# Patient Record
Sex: Male | Born: 1935 | Race: White | Hispanic: No | Marital: Married | Smoking: Former smoker
Health system: Southern US, Community
[De-identification: ages and names within clinical notes are randomized; demographics above are authoritative.]

## PROBLEM LIST (undated history)

## (undated) DIAGNOSIS — I10 Essential (primary) hypertension: Secondary | ICD-10-CM

## (undated) DIAGNOSIS — I639 Cerebral infarction, unspecified: Secondary | ICD-10-CM

## (undated) DIAGNOSIS — Z9889 Other specified postprocedural states: Secondary | ICD-10-CM

## (undated) DIAGNOSIS — M1990 Unspecified osteoarthritis, unspecified site: Secondary | ICD-10-CM

## (undated) DIAGNOSIS — K529 Noninfective gastroenteritis and colitis, unspecified: Secondary | ICD-10-CM

## (undated) DIAGNOSIS — R042 Hemoptysis: Secondary | ICD-10-CM

## (undated) DIAGNOSIS — I6529 Occlusion and stenosis of unspecified carotid artery: Secondary | ICD-10-CM

## (undated) DIAGNOSIS — E785 Hyperlipidemia, unspecified: Secondary | ICD-10-CM

## (undated) DIAGNOSIS — IMO0002 Reserved for concepts with insufficient information to code with codable children: Secondary | ICD-10-CM

## (undated) DIAGNOSIS — W1800XA Striking against unspecified object with subsequent fall, initial encounter: Secondary | ICD-10-CM

## (undated) HISTORY — DX: Cerebral infarction, unspecified: I63.9

## (undated) HISTORY — DX: Noninfective gastroenteritis and colitis, unspecified: K52.9

## (undated) HISTORY — DX: Hyperlipidemia, unspecified: E78.5

## (undated) HISTORY — DX: Occlusion and stenosis of unspecified carotid artery: I65.29

## (undated) HISTORY — DX: Striking against unspecified object with subsequent fall, initial encounter: W18.00XA

## (undated) HISTORY — DX: Essential (primary) hypertension: I10

## (undated) HISTORY — DX: Other specified postprocedural states: Z98.890

## (undated) HISTORY — DX: Unspecified osteoarthritis, unspecified site: M19.90

## (undated) HISTORY — DX: Reserved for concepts with insufficient information to code with codable children: IMO0002

## (undated) HISTORY — PX: OTHER SURGICAL HISTORY: SHX169

## (undated) HISTORY — PX: FRACTURE SURGERY: SHX138

---

## 1997-12-31 ENCOUNTER — Inpatient Hospital Stay (HOSPITAL_COMMUNITY): Admission: EM | Admit: 1997-12-31 | Discharge: 1998-01-02 | Payer: Self-pay | Admitting: Emergency Medicine

## 2000-03-01 ENCOUNTER — Encounter: Payer: Self-pay | Admitting: Vascular Surgery

## 2000-03-02 ENCOUNTER — Inpatient Hospital Stay: Admission: RE | Admit: 2000-03-02 | Discharge: 2000-03-03 | Payer: Self-pay | Admitting: Vascular Surgery

## 2000-03-02 ENCOUNTER — Encounter (INDEPENDENT_AMBULATORY_CARE_PROVIDER_SITE_OTHER): Payer: Self-pay | Admitting: Specialist

## 2000-03-02 HISTORY — PX: CAROTID ENDARTERECTOMY: SUR193

## 2000-07-10 ENCOUNTER — Encounter: Payer: Self-pay | Admitting: *Deleted

## 2000-07-11 ENCOUNTER — Inpatient Hospital Stay (HOSPITAL_COMMUNITY): Admission: EM | Admit: 2000-07-11 | Discharge: 2000-07-18 | Payer: Self-pay | Admitting: *Deleted

## 2000-07-11 ENCOUNTER — Encounter: Payer: Self-pay | Admitting: Orthopaedic Surgery

## 2000-07-12 ENCOUNTER — Encounter: Payer: Self-pay | Admitting: Orthopaedic Surgery

## 2000-07-14 ENCOUNTER — Encounter: Payer: Self-pay | Admitting: Orthopaedic Surgery

## 2000-07-20 ENCOUNTER — Encounter: Admission: RE | Admit: 2000-07-20 | Discharge: 2000-07-20 | Payer: Self-pay | Admitting: Family Medicine

## 2007-01-30 ENCOUNTER — Ambulatory Visit: Payer: Self-pay | Admitting: Vascular Surgery

## 2008-02-02 ENCOUNTER — Ambulatory Visit: Payer: Self-pay | Admitting: Vascular Surgery

## 2009-01-31 ENCOUNTER — Ambulatory Visit: Payer: Self-pay | Admitting: Vascular Surgery

## 2010-01-06 ENCOUNTER — Emergency Department (HOSPITAL_COMMUNITY): Admission: EM | Admit: 2010-01-06 | Discharge: 2010-01-06 | Payer: Self-pay | Admitting: Family Medicine

## 2010-01-06 ENCOUNTER — Emergency Department (HOSPITAL_COMMUNITY): Admission: EM | Admit: 2010-01-06 | Discharge: 2010-01-07 | Payer: Self-pay | Admitting: Emergency Medicine

## 2010-01-10 ENCOUNTER — Emergency Department (HOSPITAL_COMMUNITY): Admission: EM | Admit: 2010-01-10 | Discharge: 2010-01-10 | Payer: Self-pay | Admitting: Emergency Medicine

## 2010-01-30 ENCOUNTER — Ambulatory Visit: Payer: Self-pay | Admitting: Vascular Surgery

## 2010-04-23 ENCOUNTER — Encounter: Admission: RE | Admit: 2010-04-23 | Discharge: 2010-04-23 | Payer: Self-pay | Admitting: Family Medicine

## 2010-10-11 LAB — POCT I-STAT, CHEM 8
BUN: 29 mg/dL — ABNORMAL HIGH (ref 6–23)
Calcium, Ion: 1.15 mmol/L (ref 1.12–1.32)
Creatinine, Ser: 1 mg/dL (ref 0.4–1.5)
Glucose, Bld: 108 mg/dL — ABNORMAL HIGH (ref 70–99)
HCT: 35 % — ABNORMAL LOW (ref 39.0–52.0)
Hemoglobin: 11.9 g/dL — ABNORMAL LOW (ref 13.0–17.0)
Potassium: 4.4 mEq/L (ref 3.5–5.1)
Sodium: 134 mEq/L — ABNORMAL LOW (ref 135–145)

## 2010-10-11 LAB — CBC
HCT: 32.2 % — ABNORMAL LOW (ref 39.0–52.0)
Hemoglobin: 11.1 g/dL — ABNORMAL LOW (ref 13.0–17.0)
MCHC: 34.5 g/dL (ref 30.0–36.0)
MCV: 98.4 fL (ref 78.0–100.0)
Platelets: 189 10*3/uL (ref 150–400)
RBC: 3.27 MIL/uL — ABNORMAL LOW (ref 4.22–5.81)
WBC: 7.4 10*3/uL (ref 4.0–10.5)

## 2010-10-12 LAB — BASIC METABOLIC PANEL
GFR calc Af Amer: >60 mL/min (ref >60)
Glucose, Bld: 187 mg/dL — ABNORMAL HIGH (ref 70–99)
Sodium: 141 mEq/L (ref 135–145)

## 2010-10-12 LAB — DIFFERENTIAL
Basophils Absolute: 0 10*3/uL (ref 0.0–0.1)
Eosinophils Absolute: 0 10*3/uL (ref 0.0–0.7)
Eosinophils Relative: 0 % (ref 0–5)
Lymphs Abs: 0.5 10*3/uL — ABNORMAL LOW (ref 0.7–4.0)
Monocytes Relative: 9 % (ref 3–12)
Neutrophils Relative %: 86 % — ABNORMAL HIGH (ref 43–77)

## 2010-10-12 LAB — CBC
Hemoglobin: 15.4 g/dL (ref 13.0–17.0)
WBC: 9.7 10*3/uL (ref 4.0–10.5)

## 2010-12-08 NOTE — Assessment & Plan Note (Signed)
OFFICE VISIT   MONTRAE, BRAITHWAITE  DOB:  [birth year]                                       01/31/2009  CHART#:[id number]   Charles Bridges presents today for follow up of Charles extreme cerebrovascular  occlusive disease and also concern regarding left ankle cellulitis  concern regarding arterial flow.  He is a very active, pleasant 73-year  gentleman who is now 9 years status post right carotid endarterectomy by  myself.  We follow him in noninvasive followup for this, and has had no  evidence of recurrence.  He seems to have no history of amaurosis fugax,  transient ischemic attack or stroke.  He does have an episode of  erythema and irritation in Charles left medial ankle with some bilateral  swelling.  He reports this has spontaneously resolved.  He was concerned  since Charles mother had lower extremity amputation prior to her death  related to arterial insufficiency.  He is quite active.  He hunts  regularly and does not have any new medical difficulties.   PHYSICAL EXAMINATION:  Well-nourished, well-developed white male  appearing stated age of 70.  Charles blood pressure is 167/70, pulse 64,  respirations 18.  He does have a well-healed right carotid incision.  He  has no bruits bilaterally.  He is grossly intact neurologically.  He has  2+ radial and 2+ posterior tibial pulses.   He underwent noninvasive vascular laboratory studies today and this  reveals no change.  Charles right endarterectomy site is widely patent with  no evidence of restenosis.  Charles left carotid is stable at 40-59%  asymptomatic narrowing.  I discussed this with Charles Bridges and Charles Bridges  present.  We will see him again on a yearly basis for carotid duplex to  rule out any progression of carotid disease.  I explained that he has  normal lower extremity flow and should be at no increased risk for  amputation currently.   Larina Earthly, M.D.  Electronically Signed   TFE/MEDQ  D:  01/31/2009  T:   02/03/2009  Job:  [id number]

## 2010-12-08 NOTE — Procedures (Signed)
CAROTID DUPLEX EXAM   INDICATION:  Followup evaluation of known carotid artery disease.   HISTORY:  Diabetes:  Yes.  Cardiac:  No.  Hypertension:  Yes.  Smoking:  Quit in 2002.  Previous Surgery:  Right carotid endarterectomy on 03/02/2000 by Dr.  Arbie Cookey.  CV History:  Stroke in 1998 and 1999.  Amaurosis Fugax No, Paresthesias Yes, Hemiparesis No                                       RIGHT             LEFT  Brachial systolic pressure:         146               154  Brachial Doppler waveforms:         WNL               WNL  Vertebral direction of flow:        Antegrade         Antegrade  DUPLEX VELOCITIES (cm/sec)  CCA peak systolic                   89                194  ECA peak systolic                   168               204  ICA peak systolic                   79                134  ICA end diastolic                   26                36  PLAQUE MORPHOLOGY:                  None              Calcified  PLAQUE AMOUNT:                      None              Moderate  PLAQUE LOCATION:                    CCA               ICA/ECA   IMPRESSION:  1. Patent right internal carotid artery with no evidence of      restenosis.  2. 40-59% left internal carotid artery stenosis.        ___________________________________________  Larina Earthly, M.D.   AC/MEDQ  D:  01/31/2009  T:  01/31/2009  Job:  [id number]

## 2010-12-08 NOTE — Procedures (Signed)
CAROTID DUPLEX EXAM   INDICATION:  Followup known carotid artery disease.   HISTORY:  Diabetes:  No  Cardiac:  No  Hypertension:  Yes  Smoking:  Quit in 2002  Previous Surgery:  Right carotid endarterectomy.  Please see note from  below  CV History:  Amaurosis Fugax Yes No, Paresthesias Yes No, Hemiparesis Yes No                                       RIGHT             LEFT  Brachial systolic pressure:         120               120  Brachial Doppler waveforms:         Biphasic          Biphasic  Vertebral direction of flow:        Antegrade         Antegrade  DUPLEX VELOCITIES (cm/sec)  CCA peak systolic                   71                97  ECA peak systolic                   159               163  ICA peak systolic                   77                112  ICA end diastolic                   17                15  PLAQUE MORPHOLOGY:                  None              Calcified  PLAQUE AMOUNT:                      None              Mild  PLAQUE LOCATION:                    None              ICA and ECA.   IMPRESSION:  A 20-39% stenosis noted in the left internal carotid  artery.  Normal carotid duplex with minimal intimal changes noted in the  right internal carotid artery.  Status post right carotid endarterectomy  and right bilateral vertebral arteries.   ___________________________________________  Larina Earthly, M.D.   MG/MEDQ  D:  01/30/2007  T:  01/30/2007  Job:  [id number]

## 2010-12-08 NOTE — Assessment & Plan Note (Signed)
OFFICE VISIT   SHANDY, VI  DOB:  1936/01/22                                       01/30/2007  CHART#:[id number]   The patient presents today for continued followup of his carotid  disease. He is in his usual good health with no new major medical  difficulties. He denies any new cardiac difficulties. He is  hypertensive. He does not smoking having quit several years ago. He is  status post right carotid endarterectomy. He has questions regarding the  Dupuytren's contraction in both hands and this is not currently limiting  to him and I have recommended that he continue observation unless this  is limiting. He does have gout in his wrists as well and this is  limiting him as much as the contractures.   Does have 2+ radial pulses bilaterally. His right carotid incision is  well-healed with no bruits bilaterally. He is grossly intact  neurologically. He underwent repeat carotid duplex in our offices and  reveals no significant change. He does have minimal plaque in the right  carotid system with no stenosis. He does have a 20-39% stenosis in the  left carotid.   I have recommended that we continue to see him on a yearly basis. He  will notify us should he develop any difficulty and otherwise we will  repeat his duplex in one year.   Larina Earthly, M.D.  Electronically Signed   TFE/MEDQ  D:  01/30/2007  T:  01/30/2007  Job:  [id number]   cc:   Lianne Bushy, M.D.

## 2010-12-08 NOTE — Procedures (Signed)
CAROTID DUPLEX EXAM   INDICATION:  Follow up known carotid disease.   HISTORY:  Diabetes:  Yes.  Cardiac:  Some tachycardia lately.  Hypertension:  Yes.  Smoking:  Quit in 2002.  Previous Surgery:  Right carotid endarterectomy on 03/02/2000 by Dr.  Arbie Cookey.  CV History:  Stroke in 1998 and 1999.  The patient blacked out 3 weeks  ago.  Amaurosis Fugax No, Paresthesias No, Hemiparesis No                                       RIGHT             LEFT  Brachial systolic pressure:         140               140  Brachial Doppler waveforms:         normal            normal  Vertebral direction of flow:        antegrade         antegrade  DUPLEX VELOCITIES (cm/sec)  CCA peak systolic                   93                154  ECA peak systolic                   143               287  ICA peak systolic                   102               186  ICA end diastolic                   16                32  PLAQUE MORPHOLOGY:                  mixed             calcific  PLAQUE AMOUNT:                      mild              moderate  PLAQUE LOCATION:                    CCA               ICA, ECA and CCA   IMPRESSION:  1. Patent right carotid endarterectomy with no evidence of restenosis      or narrowing.  2. Doppler velocities suggest 40%-59% stenosis in the left internal      carotid artery.  Left external carotid artery stenosis.  3. Antegrade flow in bilateral vertebral arteries.   ___________________________________________  Larina Earthly, M.D.   NT/MEDQ  D:  01/30/2010  T:  01/30/2010  Job:  [id number]

## 2010-12-08 NOTE — Procedures (Signed)
CAROTID DUPLEX EXAM   INDICATION:  Follow-up evaluation of known carotid artery disease.   HISTORY:  Diabetes:  No.  Cardiac:  No.  Hypertension:  Yes.  Smoking:  Quit in 2002.  Previous Surgery:  Right carotid endarterectomy with on 03/02/00 by Dr.  Arbie Cookey.  CV History:  Previous duplex on 01/30/07 revealed a 20-39% left internal  carotid artery stenosis and no right internal carotid artery stenosis,  status post endarterectomy.  Patient complains of residual left arm  numbness since his stroke.  Amaurosis Fugax No, Paresthesias Yes, Hemiparesis No.                                       RIGHT             LEFT  Brachial systolic pressure:         164               168  Brachial Doppler waveforms:         Triphasic         Triphasic  Vertebral direction of flow:        Antegrade         Antegrade  DUPLEX VELOCITIES (cm/sec)  CCA peak systolic                   83                103  ECA peak systolic                   162               181  ICA peak systolic                   112               121  ICA end diastolic                   20                24  PLAQUE MORPHOLOGY:                  Soft              Calcified  PLAQUE AMOUNT:                      Minimal           Mild-to-moderate  PLAQUE LOCATION:                    Mid ICA           Proximal ICA   IMPRESSION:  1. 20-39% right internal carotid artery stenosis, status post      endarterectomy.  2. 40-59% left internal carotid artery stenosis.  3. No significant change from previous study performed on 01/30/07.   ___________________________________________  Larina Earthly, M.D.   MC/MEDQ  D:  02/02/2008  T:  02/02/2008  Job:  [id number]

## 2010-12-11 NOTE — H&P (Signed)
[facility]  Patient:    Charles Bridges, Charles Bridges                         MRN: [id number] Adm. Date:  03/02/00 Attending:  Larina Earthly, M.D. Dictator:   Maple Mirza, P.A.                         History and Physical  DATE OF BIRTH:  May 27, 1936  PRIMARY CARE PHYSICIAN:  Maricela Bo, M.D.  REFERRING NEUROLOGIST:  Marlan Palau, M.D.  PRESENTING CIRCUMSTANCE:  "I am here for surgery."  HISTORY OF PRESENT ILLNESS:  This is a 75 year old male with a history of two right hemispheric cerebrovascular accidents in 1998 and then in 1999.  Both affected his left side.  He has made good progression in rehabilitation such that his deficits are currently minor on the left.  He has been followed for extracranial cerebrovascular occlusive disease by Dr. Arbie Cookey, and presents at his latest examination with significant progress of his right internal carotid artery stenosis.  Carotid Doppler ultrasound studies done on February 25, 2000 demonstrate severe right internal carotid artery flow reduction and moderate left internal carotid artery flow reduction.  He is currently asymptomatic. He has no symptoms of amaurosis fugax, dizziness, no new left sided focal deficits.  Of note, however, he complains of a burning sensation which is intermittent and affects the vertex of the skull posteriorly on the left side. He says it also itches, as well, and he feels as if he is able to extract scabs from the scalp.  He says that his aunt had a history of shingles affecting her scalp.  On cursory examination today, I could find no evidence of vesicles or evidence of any other dermatologically affecting rash or lesion.  We will just have to follow this as he gets to [facility].  ALLERGIES:  No known drug allergies.  MEDICATIONS: 1. Plavix 75 mg q.d. 2. Hydrochlorothiazide 25 mg q.d. 3. Lipitor 10 mg q.d. 4. Sulindac 200 mg q.d. (which is an NSAID).  PAST  MEDICAL HISTORY:  1. Right cerebrovascular accident x 2 in 1998 and 1999 with minor left sided     sequelae.  2. Hypertension.  3. Hypercholesterolemia.  4. History of long term chronic tobacco habituation, smoking two packs per     day currently.  5. One case of beer imbibed each week.  6. Status post right shoulder fracture repair 40 years ago.  7. History of hiatal hernia/gastric ulcers.  8. Status post vagotomy 30 years ago.  9. Chronic diarrhea. 10. The patient denies any prior history of myocardial infarction, diabetes,     nephrolithiasis, cholecystitis, rheumatic fever or tuberculosis.  SOCIAL HISTORY:  He is married for the second time.  His last marriage lasting 27 years.  He has two children in good health.  He is retired currently.  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  He currently smokes two packs per day. He started when he was aged 28.  This is a 42 year, two pack per day history. He also partakes of alcoholic beverages, imbibing one case of beer per week plus home made wine as necessary.  FAMILY HISTORY:  His mother died at the age of 47.  She had circulation problems and had a lower extremity amputation, but she died of breast cancer. His father died at the age of 2  of a cerebrovascular accident while golfing. One brother died of a brain tumor.  His other siblings are in good health.  PERTINENT REVIEW OF SYSTEMS:  Once again, he complains of a burning sensation in the posterior left scalp without any evident lesions at the present time. He complains of chronic diarrhea.  At one time, he was on a medication which he was not aware of, but this has now been allowed to lapse.  He has left sided sequelae of his right cerebrovascular accident, for which he has compensated well.  It is still slightly weaker than the right and a little more clumsy than the right as far as his motor function is concerned on the left.  He is possibly a candidate for withdrawal symptoms,  since he imbibes one case of beer per week and imbibes home made wine as needed.  PHYSICAL EXAMINATION:  GENERAL:  Alert and oriented, quite vigorous appearing gentleman who leads an active life style in no acute distress.  VITAL SIGNS:  Temperature 97.9, pulse 80, respirations 18, blood pressure in the left upper extremity 150/60.  HEENT:  Pupils equal, round and reactive to light.  Extraocular movements intact.  Ears and hearing grossly normal.  Nares patent.  Sinuses clear. Ophthalmoscopic examination deferred.  Oropharynx - I did not question whether he wears dentures or not.  NECK:  Supple.  No jugular venous distention.  No thyromegaly.  There is very soft right carotid bruit auscultated.  A moderately soft left carotid bruit is auscultated.  Carotid up strokes are 2+ bilaterally.  CHEST:  Lungs clear to auscultation and percussion bilaterally.  HEART:  Regular rate and rhythm with occasional premature beats.  ABDOMEN:  Soft.  Bowel sounds are present.  Abdominal aorta is nonpulsatile. No abdominal bruits auscultated.  No hepatosplenomegaly.  EXTREMITIES:  No evidence of clubbing or cyanosis.  Radial pulses 4/4 bilaterally.  Femoral pulses 4/4 bilaterally.  He has palpable posterior tibial pulses bilaterally.  No ischemic changes to the lower extremities.  No varicosities.  NEUROMUSCULAR:  Grip is 4/5 on the left, 5/5 on the right.  His fine motor coordination is preserved both right and left.  His ambulation is steady. His gait is normal.  Deep tendon reflexes are 2+ bilaterally in the patellar region.  RECTAL:  Deferred.  IMPRESSION: 1. Severe right internal carotid artery stenosis. 2. History of right hemispheric cerebrovascular accident x 2.  PLAN:  Right carotid endarterectomy on August 8 by Dr. Gretta Began. DD:  03/01/00 TD:  03/01/00 Job: [id number]
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
## 2010-12-11 NOTE — H&P (Signed)
[place]. [facility]  Patient:    Charles Bridges, Charles Bridges                       MRN: [id number] Adm. Date:  [id number] Attending:  Randolm Idol                         History and Physical  SHE SAYS TO PLEASE DISREGARD THIS DICTATION. DD:  07/11/00 TD:  07/11/00 Job: [id number]

## 2010-12-11 NOTE — Op Note (Signed)
[place]. [facility]  Patient:    Charles Bridges, Charles Bridges                       MRN: [id number] Proc. Date: 07/12/00 Adm. Date:  [id number] Attending:  Randolm Idol                           Operative Report  PREOPERATIVE DIAGNOSIS:  Displaced left distal third tibia fracture.  POSTOPERATIVE DIAGNOSIS:  Displaced left distal third tibia fracture.  PROCEDURE:  Closed reduction with internal fixation with intramedullary nail.  SURGEON:  Claude Manges. Cleophas Dunker, M.D.  ASSISTANT:  Jamelle Rushing, P.A.  ANESTHESIA:  General orotracheal.  COMPLICATIONS:  None.  DESCRIPTION OF PROCEDURE:  With the patient comfortable on the trauma table and under general orotracheal anesthesia, the left lower extremity was placed in a thigh tourniquet.  The leg was then prepped with Duraprep from the tips of the toes to the tourniquet.  Sterile draping was performed.  With the extremity still elevated, it was Esmarch exsanguinated, the proximal tourniquet to 350 mmHg.  A longitudinal incision was made just medial to the midline between the joint line and the tibial tubercle.  By sharp dissection, the incision was carried down through subcutaneous tissue and fascia.  This was incised with a knife and then subperiosteally elevated.  The patellar tendon was retracted laterally at a point directly at the midline of the patellar tendon between the tibial tubercle and the joint line.  A small drill hole was made.  The awl was then introduced to obtain the starter hole.  I then inserted the bulb-tipped guide pin and was able to place it through the proximal tibial metaphysis through the shaft, and then under image intensification it was guided across the fracture line as Lyman Speller maintained anatomical reduction of the fracture.  We checked to be sure that the guide pin was to the center of the tibial canal in both the AP and lateral projections, and it appeared it was well within  the bone.  The bulb tip was impacted into the subchondral bone in the distal tibia.  Preoperatively I had measured about an 8 mm nail at the most narrow point of the isthmus.  Reaming was performed to 9 mm with a 1 mm over-ream to accept an 8 mm nail.  Reaming was performed with image intensification, revealing excellent position, the guide pin maintained its position.  The plastic exchange tube was then inserted over the ball-tip guide pin, and the guide tip pin was exchanged for a more narrow guide pin.  We then measured the length of the tibia at 33 mm with an 8 mm nail.   The Ace DePuy titanium nail was then impacted over the guide pin through the proximal tibia across the fracture site, maintaining manual reduction, into the distal tibial metaphysis.  We had an excellent position and perfect stability with medial-lateral motion.  There was some instability with rotation as expected.  The proximal guide had been applied to the proximal pin.  Two crossed screws were then inserted into the proximal tibia under image intensification with excellent purchase and good position of the pins in both AP and lateral projections.  The proximal guide was then removed from the field.  Two distal screws were then inserted to stabilize the fracture in rotation. Under direct image intensification in the lateral projection, small stab wounds were made.  The drill holes were then placed through the tibia medial to lateral, making sure that we were in the center of the nail.  We measured the depth of the screw and inserted self-tapping cortical screws.  We had excellent purchase with good position on the AP projection.  The fracture was fluoroscoped.  There was no malrotation, and we had excellent stability.  The wounds were then irrigated with antibiotic solution.  The subcutaneous tissue was closed with 2-0 Vicryl distally and the skin closed with skin clips proximally.  The deep fascia was closed  with a running 0 Vicryl, the proximal fascia with 2-0 Vicryl, skin was closed with skin clips. Marcaine 0.25% without epinephrine was injected into the wound edges.  The tourniquet was deflated with immediate capillary refill to the toes.  A sterile bulky dressing was applied from the ankle to the knee, followed by an Ace bandage.  The patient was then returned to the postanesthesia recovery room in satisfactory condition. DD:  07/12/00 TD:  07/13/00 Job: [id number]

## 2010-12-11 NOTE — Discharge Summary (Signed)
[facility]  Patient:    Charles Bridges, Charles Bridges                       MRN: [id number] Adm. Date:  [id number] Disc. Date: 03/03/00 Attending:  Alyson Locket Dictator:   Maple Mirza, P.A. CC:         Maricela Bo, M.D., [place], [place], M.D.   Discharge Summary  DATE OF BIRTH:  1936/02/22  FINAL DIAGNOSES: 1. 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 2. History of right hemispheric cerebrovascular accident x 2 with resolution    of sequelae.  SECONDARY DIAGNOSES: 1. History of right cerebrovascular accident x 2 in 1998 and 1999. 2. Hypertension. 3. Hypercholesterolemia. 4. History of long-term chronic tobacco habituation. 5. Significant ethanol use, one case of beer per week with wine    supplementation. 6. Status post right shoulder fracture repair 40 years ago. 7. History of hiatal hernia/gastric ulcers. 8. Status post vagotomy 30 years ago. 9. Chronic diarrhea.  PROCEDURE:  March 02, 2000, right carotid endarterectomy with Dacron angioplasty, intraoperative shunting, Dr. Tawanna Cooler Early.  The patient tolerated the procedure well and was transferred in stable and satisfactory condition to the recovery room.  He awoke there with full baseline neurologic status, no focal or regional neurologic deficits.  He was not confused.  He was quite calm, and he remains this way in the postoperative period.  DISCHARGE DISPOSITION:  Charles Bridges is judged a suitable candidate on March 03, 2000.  His incision is healing nicely.  There is no evidence of erythema, swelling, or drainage.  His mental status has been clear in the postoperative period.  He has had no neurologic deficit manifested.  He is taking all nourishment well, tolerating it.  He is ambulating without assistance.  His postoperative laboratories are all within normal limits.  His pain is well  controlled with oral analgesia.  DISCHARGE ACTIVITY:  Ambulate as tolerated.  He is asked not to drive for the next two weeks.  DISCHARGE DIET:  Low-sodium, low-cholesterol diet.  WOUND CARE:  He may shower beginning Saturday, August 11.  He is asked to sponge bathe until then and keep his incision clean and dry.  FOLLOWUP:  He has an office visit for followup with Dr. Gretta Began on Thursday, March 17, 2000, at 10:40 in the morning.  DISCHARGE MEDICATIONS: 1. Percocet 5/325 one to two tablets p.o. q.4-6h. p.r.n. pain. 2. Plavix 75 mg daily. 3. Hydrochlorothiazide 25 mg daily. 4. Lipitor 10 mg daily. 5. Sulindac 200 mg daily.  BRIEF HISTORY:  Charles Bridges is a 75 year old male with a history of two right hemispheric cerebrovascular accidents in 1998 and then in 1999, both affected his left side.  He has made good progression in rehabilitation such that his deficits are currently minor on the left.  He has been followed for his extracranial cerebrovascular occlusive disease by Dr. Arbie Cookey and presented at his latest examination with significant progress in a right internal artery stenosis.  Carotid duplex ultrasound was done August 2 that demonstrates severe right internal carotid artery flow reduction and moderate left internal carotid artery flow reduction.  He is current asymptomatic and has had no symptoms of amaurosis fugax, no dizziness, no left-sided focal deficits.  He does, however, complain of burning sensation which is intermittent and affects the vertex of the skull posteriorly on the left side.  He also says that this area itches as well, and he feels as if he is able to extract scabs from the scalp.  He said that his aunt had a history of shingles which affected her scalp.  On cursory examination prior to the surgery, there was no evidence of vesicles or other dermatologically affecting rash or lesion.  HOSPITAL COURSE:  As dictated in the Discharge Disposition.   Charles Bridges awoke in the recovery room with baseline neurologic status.  He has remained that way in the postoperative period.  He has not been confused postoperatively.  His postoperative labs are within normal limits.  His vital signs have been stable.  He is taking oral nourishment.  His incision is healing nicely.  His pain is controlled with oral analgesia.  He is ambulating.  He will be discharged August 9 with the medications above and followup with Dr. Arbie Cookey. DD:  03/02/00 TD:  03/03/00 Job: [id number]

## 2010-12-11 NOTE — Op Note (Signed)
[facility]  Patient:    Charles Bridges, Charles Bridges                       MRN: [id number] Proc. Date: 03/02/00 Adm. Date:  [id number] Attending:  Alyson Locket CC:         Maricela Bo, M.D., [address] [place], [place] [address]   Operative Report  PREOPERATIVE DIAGNOSIS:  Severe asymptomatic right internal carotid artery stenosis.  POSTOPERATIVE DIAGNOSIS:  Severe asymptomatic right internal carotid artery stenosis.  PROCEDURE:  Right carotid endarterectomy, Dacron patch angioplasty.  SURGEON:  Larina Earthly, M.D.  ASSISTANT:  Loura Pardon, P.A.  ANESTHESIA:  General endotracheal.  COMPLICATIONS:  None.  DISPOSITION:  To recovery room stable.  DESCRIPTION OF PROCEDURE:  The patient was taken to the operating room and placed in a supine position where the area of the right neck was prepped and draped in the usual sterile fashion.  Incision made anterior sternocleidomastoid and carried down through the platysma with electrocautery. The sternocleidomastoid reflected posteriorly, and the carotid sheath was opened.  The facial vein was ligated with 2-0 silk ties and divided. Hypoglossal and vagus nerves were identified and preserved.  The common carotid artery was encircled with an umbilical tape and Rumel tourniquet.  The external carotid was encircled with a blue Vessi-loop.  The internal carotid was encircled with an umbilical tape and Rumel tourniquet.  The superior thyroid artery was encircled with a 2-0 silk Potts tie.  The patient was given 7000 units of intravenous heparin.  After adequate circulation time, the internal and external common carotid arteries were occluded.  The common carotid arteries opened with an 11 blade and extended longitudinally with Potts scissors.  The 10 shunt was passed into the internal carotid, down the back ______ and down the common carotid where it was secured with Rumel tourniquet.  Endarterectomy was begun  on the common carotid artery. The plaque was divided proximally with Potts scissors.  The endarterectomy was continued on to the bifurcation.  The external carotid endarterectomy with eversion technique.  The internal carotid endarterectomy in an open fashion.  Remaining atheromatous debris was removed from the endarterectomy plane.  The Finesse Hemashield Dacron patch was brought onto the field and was sewn as a patch angioplasty with a running 6-0 Prolene suture.  Prior to completion of the anastomosis, the usual flushing maneuvers were undertaken.  The anastomosis was then completed in the external followed by the common followed by the internal carotid artery.  Occlusion clamp was removed.  Excellent flow characteristics were noted with hand-held Doppler in the internal and external carotid arteries.  The patient was given 50 mg of Protamine to reverse the heparin.  The wounds were irrigated with saline.  Hemostasis achieved with electrocautery.  The wounds were closed with 3-0 Vicryl in the subcutaneous and subcuticular tissue.  Benzoin and Steri-Strips were applied. DD:  03/02/00 TD:  03/02/00 Job: [id number]

## 2010-12-11 NOTE — Discharge Summary (Signed)
[place]. [facility]  Patient:    Charles Bridges, Charles Bridges                       MRN: [id number] Adm. Date:  [id number] Disc. Date: [id number] Attending:  Randolm Idol Dictator:   Jamelle Rushing, P.A.C.                           Discharge Summary  ADMISSION DIAGNOSES: 1. Left tibial fracture. 2. Hyponatremia. 3. Hypokalemia. 4. Hypertension. 5. History of cerebrovascular accident. 6. Hypercholesterolemia. 7. Alcohol use.  DISCHARGE DIAGNOSES: 1. Left IM nailing of tibial fracture. 2. Hyponatremia. 3. Hypokalemia. 4. Hypertension. 5. Alcohol use. 6. Hypercholesterolemia. 7. History of cerebrovascular accident.  HISTORY OF PRESENT ILLNESS:  The patient is a 75 year old white male who was walking in his driveway on a night previous to admission when he fell.  The patient heard a popping in his left leg and was unable to stand.  The patient stated he was drinking on the night of the injury.  ALLERGIES:  No known drug allergies.  CURRENT MEDICATIONS: 1. Plavix 75 mg p.o. q.d. 2. Lipitor 10 mg p.o. q.d. 3. Sulindac 200 mg p.o. q.d. 4. Hydrochlorothiazide 25 mg p.o. q.d. 5. Vitamin E 400 i.u. p.o. q.d.  SURGICAL PROCEDURE:  On July 12, 2000, the patient was taken to the OR by Claude Manges. Cleophas Dunker, M.D., assisted by Jamelle Rushing, P.A.C.  Under general anesthesia the patient had an open reduction and internal fixation with an intramedullary nail of his left tibial fracture.  The patient tolerated the procedure well.  No drains were left in place and the patient was transferred to the recovery room in good condition.  COMPLICATIONS:  There were no complications.  CONSULTS:  On admission, a family practice teaching service consult was requested for evaluation and management of the patients multiple medical problems.  On July 12, 2000, physical therapy consult was requested.  HOSPITAL COURSE:  On July 11, 2000, the patient was admitted  to [facility]. [facility] under the care of Dr. Norlene Campbell.  The patient was evaluated by family practice teaching service for evaluation of the patients multiple medical problems and management of these.  The patient was also asked to be cleared for his upcoming surgery. The patient was cleared so he was, in fact, taken to the OR on hospital day #2.  On July 12, 2000, the patient was taken to the OR where a closed reduction and internal nailing of the left tibial fracture was performed.  The patient tolerated the procedure well and was discharged to the recovery room in good condition.  The patient then had a six day postoperative course in which he did have some initial problems with increased swelling and pain in his left lower extremity.  Doppler evaluation of that was performed and was found to be negative for any DVT, superficial thrombus, or Bakers cyst.  The patient continued to progress nicely, have less and less pain on a day to day basis and no significant problems from his multiple medical problems were incurred during this hospitalization.  The patient was discharged to home on postoperative day #6, being afebrile, vital signs stable.  His left leg was moderately ecchymotic with skin blisters.  His leg was neuromotor vascularly intact.  LABS:  Venous Doppler on July 14, 2000, found no evidence of lower extremity DVT, superficial thrombus or Bakers cyst  bilaterally.  EKG on July 11, 2000, was normal sinus rhythm at 80 beats per minute.  Chest x-ray on July 14, 2000, showed mild right basilar atelectasis with interval worsening on the left.  A CBC on July 15, 2000, showed wbc 7.3, hemoglobin 10.9, hematocrit 30.6, platelets 247.  Routine chemistries on July 16, 2000, showed sodium of 135, potassium 4.4, glucose 93, BUN 7, creatinine 0.7.  Lipid studies on July 12, 2000, showed cholesterol 121, triglycerides 124, HDL 44, LDL  52, cholesterol to HDL ratio 2.8.  Alcohol level on admission was 176.  MEDICATIONS ON DISCHARGE: 1. Multivitamins one tablet p.o. q.d. 2. Thiamine 100 mg p.o. q.d. 3. Protonix 40 mg p.o. q.d. 4. Nicotine patch 21 mcg q.24h. 5. Hydrochlorothiazide 25 mg p.o. q.d. 6. Plavix 75 mg p.o. q.d. 7. Zocor 20 mg p.o. q.d. 8. Norvasc 5 mg p.o. q.d. 9. Darvocet one or two tablets every four to six hours p.r.n. pain.  DISCHARGE INSTRUCTIONS: 1. Medications:  Darvocet-N 100 one or two tablets every four to six    hours    for pain if needed. 2. Activity:  Touchdown weightbearing only on the left foot with the use    of a walker, elevate leg at all times when not walking, use wheelchair    whenever possible. 3. Wound care:  Keep wound clean and dry and change dressing daily.  Check for    infection, any increased redness, swelling, numbness or pain. 4. The patient is to have home health physical therapy through [facility].  FOLLOW-UP:  The patient is to call Maricela Bo, M.D.s, office for an appointment in one to two weeks. Call Dr. Serita Grit office at [phone] for a follow-up appointment in one week.  CONDITION ON DISCHARGE:  Listed as good. DD:  08/13/00 TD:  08/15/00 Job: [id number]

## 2011-01-26 ENCOUNTER — Other Ambulatory Visit: Payer: Self-pay

## 2011-02-18 ENCOUNTER — Other Ambulatory Visit (INDEPENDENT_AMBULATORY_CARE_PROVIDER_SITE_OTHER): Payer: Medicare (Managed Care)

## 2011-02-18 DIAGNOSIS — I6529 Occlusion and stenosis of unspecified carotid artery: Secondary | ICD-10-CM

## 2011-02-18 DIAGNOSIS — Z48812 Encounter for surgical aftercare following surgery on the circulatory system: Secondary | ICD-10-CM

## 2011-02-24 NOTE — Procedures (Unsigned)
CAROTID DUPLEX EXAM  INDICATION:  Right carotid endarterectomy.  HISTORY: Diabetes:  Yes. Cardiac:  No. Hypertension:  Yes. Smoking:  Previous. Previous Surgery:  Right carotid endarterectomy on 03/02/2000. CV History:  History of stroke in 1998 and 1999. Amaurosis Fugax No, Paresthesias No, Hemiparesis No                                      RIGHT             LEFT Brachial systolic pressure:         136               134 Brachial Doppler waveforms:         Normal            Normal Vertebral direction of flow:        Antegrade         Antegrade DUPLEX VELOCITIES (cm/sec) CCA peak systolic                   79                128 ECA peak systolic                   133               289 ICA peak systolic                   89                151 ICA end diastolic                   10                22 PLAQUE MORPHOLOGY:                  Heterogeneous     Mixed PLAQUE AMOUNT:                      Mild              Moderate PLAQUE LOCATION:                    CCA               ICA/ECA/CCA  IMPRESSION: 1. Patent right carotid endarterectomy site with no hemodynamically     significant stenosis of the right internal carotid artery. 2. Doppler velocity suggests high end 20%-39% stenosis of the left     proximal internal carotid artery. 3. Left external carotid artery stenosis noted. 4. No significant change in the bilateral carotid arteries when     compared to the previous exam on 01/30/2010.  ___________________________________________ Larina Earthly, M.D.  CH/MEDQ  D:  02/19/2011  T:  02/19/2011  Job:  [id number]

## 2012-02-25 ENCOUNTER — Encounter: Payer: Self-pay | Admitting: Vascular Surgery

## 2012-02-29 ENCOUNTER — Ambulatory Visit: Payer: Medicare (Managed Care) | Admitting: Vascular Surgery

## 2012-02-29 ENCOUNTER — Other Ambulatory Visit: Payer: Medicare (Managed Care)

## 2012-03-06 ENCOUNTER — Encounter: Payer: Self-pay | Admitting: Neurosurgery

## 2012-03-07 ENCOUNTER — Ambulatory Visit (INDEPENDENT_AMBULATORY_CARE_PROVIDER_SITE_OTHER): Payer: Medicare (Managed Care) | Admitting: Neurosurgery

## 2012-03-07 ENCOUNTER — Ambulatory Visit (INDEPENDENT_AMBULATORY_CARE_PROVIDER_SITE_OTHER): Payer: Medicare (Managed Care) | Admitting: *Deleted

## 2012-03-07 ENCOUNTER — Encounter: Payer: Self-pay | Admitting: Neurosurgery

## 2012-03-07 VITALS — BP 130/59 | HR 59 | Resp 16 | Ht 73.5 in | Wt 229.0 lb

## 2012-03-07 DIAGNOSIS — Z48812 Encounter for surgical aftercare following surgery on the circulatory system: Secondary | ICD-10-CM

## 2012-03-07 DIAGNOSIS — I6529 Occlusion and stenosis of unspecified carotid artery: Secondary | ICD-10-CM

## 2012-03-07 NOTE — Progress Notes (Signed)
[facility] Carotid Office Note  CC: Annual carotid surveillance Referring Physician: Early  History of Present Illness: 76 year old male patient of Dr. Arbie Cookey status post right carotid endarterectomy in 2001. The patient denies signs or symptoms of CVA, TIA, amaurosis fugax or any neural deficit. The patient denies any new medical diagnoses or recent surgery.  Past Medical History  Diagnosis Date  . Carotid artery occlusion   . Hypertension   . Hyperlipidemia   . Diabetes mellitus 2006  . Arthritis   . Stroke 1998 and  1999  . Ulcer     Hiatal Hernia / Gastric ulcers  . History of vagotomy   . Chronic diarrhea     ROS: [x]  Positive   [ ]  Denies    General: [ ]  Weight loss, [ ]  Fever, [ ]  chills Neurologic: [ ]  Dizziness, [ ]  Blackouts, [ ]  Seizure [ ]  Stroke, [ ]  "Mini stroke", [ ]  Slurred speech, [ ]  Temporary blindness; [ ]  weakness in arms or legs, [ ]  Hoarseness Cardiac: [ ]  Chest pain/pressure, [ ]  Shortness of breath at rest [ ]  Shortness of breath with exertion, [ ]  Atrial fibrillation or irregular heartbeat Vascular: [ ]  Pain in legs with walking, [ ]  Pain in legs at rest, [ ]  Pain in legs at night,  [ ]  Non-healing ulcer, [ ]  Blood clot in vein/DVT,   Pulmonary: [ ]  Home oxygen, [ ]  Productive cough, [ ]  Coughing up blood, [ ]  Asthma,  [ ]  Wheezing Musculoskeletal:  [ ]  Arthritis, [ ]  Low back pain, [ ]  Joint pain Hematologic: [ ]  Easy Bruising, [ ]  Anemia; [ ]  Hepatitis Gastrointestinal: [ ]  Blood in stool, [ ]  Gastroesophageal Reflux/heartburn, [ ]  Trouble swallowing Urinary: [ ]  chronic Kidney disease, [ ]  on HD - [ ]  MWF or [ ]  TTHS, [ ]  Burning with urination, [ ]  Difficulty urinating Skin: [ ]  Rashes, [ ]  Wounds Psychological: [ ]  Anxiety, [ ]  Depression   Social History History  Substance Use Topics  . Smoking status: Former Smoker    Types: Cigarettes    Quit date: 07/27/1999  . Smokeless tobacco: Not on file  . Alcohol  Use: Yes    Family History Family History  Problem Relation Age of Onset  . Cancer Mother     breast cancer  . Other Mother     Circulation problems and lower extremity amputation  . Other Father     cerebrovascular accident    Not on File  Current Outpatient Prescriptions  Medication Sig Dispense Refill  . atorvastatin (LIPITOR) 10 MG tablet Take 10 mg by mouth daily.      . clopidogrel (PLAVIX) 300 MG TABS Take 300 mg by mouth once.      . hydrochlorothiazide (HYDRODIURIL) 25 MG tablet Take 25 mg by mouth daily.      . sulindac (CLINORIL) 200 MG tablet Take 200 mg by mouth 2 (two) times daily.        Physical Examination  Filed Vitals:   03/07/12 1149  BP: 130/59  Pulse: 59  Resp:     Body mass index is 29.80 kg/(m^2).  General:  WDWN in NAD Gait: Normal HEENT: WNL Eyes: Pupils equal Pulmonary: normal non-labored breathing , without Rales, rhonchi,  wheezing Cardiac: RRR, without  Murmurs, rubs or gallops; Abdomen: soft, NT, no masses Skin: no rashes, ulcers noted  Vascular Exam Pulses: 3+ radial pulses bilaterally Carotid bruits: Carotid pulses to auscultation no  bruits are heard Extremities without ischemic changes, no Gangrene , no cellulitis; no open wounds;  Musculoskeletal: no muscle wasting or atrophy   Neurologic: A&O X 3; Appropriate Affect ; SENSATION: normal; MOTOR FUNCTION:  moving all extremities equally. Speech is fluent/normal  Non-Invasive Vascular Imaging CAROTID DUPLEX 03/07/2012  Right ICA 0 - 19% stenosis Left ICA 20 - 39 % stenosis Previous studies were reviewed which is consistent with today's findings  ASSESSMENT/PLAN: Asymptomatic patient status post right CEA and minimal left carotid stenosis. The patient will followup here in one year with repeat carotid duplex and be seen in my clinic. The patient's questions were encouraged and answered, he is in agreement with this plan.   Lauree Chandler ANP Clinic MD: Early

## 2012-05-02 ENCOUNTER — Other Ambulatory Visit: Payer: Self-pay | Admitting: *Deleted

## 2012-05-02 DIAGNOSIS — I6529 Occlusion and stenosis of unspecified carotid artery: Secondary | ICD-10-CM

## 2012-05-02 DIAGNOSIS — Z48812 Encounter for surgical aftercare following surgery on the circulatory system: Secondary | ICD-10-CM

## 2013-03-07 ENCOUNTER — Other Ambulatory Visit (INDEPENDENT_AMBULATORY_CARE_PROVIDER_SITE_OTHER): Payer: Medicare (Managed Care) | Admitting: *Deleted

## 2013-03-07 ENCOUNTER — Ambulatory Visit: Payer: Medicare (Managed Care) | Admitting: Neurosurgery

## 2013-03-07 DIAGNOSIS — Z48812 Encounter for surgical aftercare following surgery on the circulatory system: Secondary | ICD-10-CM

## 2013-03-07 DIAGNOSIS — I6529 Occlusion and stenosis of unspecified carotid artery: Secondary | ICD-10-CM

## 2013-03-08 ENCOUNTER — Other Ambulatory Visit: Payer: Self-pay | Admitting: *Deleted

## 2013-03-08 ENCOUNTER — Encounter: Payer: Self-pay | Admitting: Vascular Surgery

## 2013-03-08 DIAGNOSIS — Z48812 Encounter for surgical aftercare following surgery on the circulatory system: Secondary | ICD-10-CM

## 2013-09-13 ENCOUNTER — Other Ambulatory Visit: Payer: Self-pay | Admitting: Vascular Surgery

## 2013-09-13 DIAGNOSIS — I6529 Occlusion and stenosis of unspecified carotid artery: Secondary | ICD-10-CM

## 2013-09-13 DIAGNOSIS — Z48812 Encounter for surgical aftercare following surgery on the circulatory system: Secondary | ICD-10-CM

## 2014-03-12 ENCOUNTER — Ambulatory Visit: Payer: Medicare (Managed Care) | Admitting: Family

## 2014-03-12 ENCOUNTER — Other Ambulatory Visit (HOSPITAL_COMMUNITY): Payer: Medicare (Managed Care)

## 2014-05-02 ENCOUNTER — Ambulatory Visit: Payer: Medicare (Managed Care) | Admitting: Family

## 2014-05-02 ENCOUNTER — Other Ambulatory Visit (HOSPITAL_COMMUNITY): Payer: Medicare (Managed Care)

## 2014-05-10 DIAGNOSIS — W1800XA Striking against unspecified object with subsequent fall, initial encounter: Secondary | ICD-10-CM

## 2014-05-10 HISTORY — DX: Striking against unspecified object with subsequent fall, initial encounter: W18.00XA

## 2014-05-21 ENCOUNTER — Encounter: Payer: Self-pay | Admitting: Family

## 2014-05-22 ENCOUNTER — Encounter: Payer: Self-pay | Admitting: Family

## 2014-05-22 ENCOUNTER — Ambulatory Visit (INDEPENDENT_AMBULATORY_CARE_PROVIDER_SITE_OTHER): Payer: Medicare Other | Admitting: Family

## 2014-05-22 ENCOUNTER — Ambulatory Visit (HOSPITAL_COMMUNITY)
Admission: RE | Admit: 2014-05-22 | Discharge: 2014-05-22 | Disposition: A | Discharge status: Home / Self Care | Payer: Medicare Other | Source: Ambulatory Visit | Attending: Family | Admitting: Family

## 2014-05-22 VITALS — BP 145/62 | HR 68 | Resp 16 | Ht 73.5 in | Wt 226.0 lb

## 2014-05-22 DIAGNOSIS — I6523 Occlusion and stenosis of bilateral carotid arteries: Secondary | ICD-10-CM

## 2014-05-22 DIAGNOSIS — I6529 Occlusion and stenosis of unspecified carotid artery: Secondary | ICD-10-CM | POA: Insufficient documentation

## 2014-05-22 DIAGNOSIS — Z48812 Encounter for surgical aftercare following surgery on the circulatory system: Secondary | ICD-10-CM | POA: Diagnosis not present

## 2014-05-22 NOTE — Patient Instructions (Addendum)
Stroke Prevention Some medical conditions and behaviors are associated with an increased chance of having a stroke. You may prevent a stroke by making healthy choices and managing medical conditions. HOW CAN I REDUCE MY RISK OF HAVING A STROKE?   Stay physically active. Get at least 30 minutes of activity on most or all days.  Do not smoke. It may also be helpful to avoid exposure to secondhand smoke.  Limit alcohol use. Moderate alcohol use is considered to be:  No more than 2 drinks per day for men.  No more than 1 drink per day for nonpregnant women.  Eat healthy foods. This involves:  Eating 5 or more servings of fruits and vegetables a day.  Making dietary changes that address high blood pressure (hypertension), high cholesterol, diabetes, or obesity.  Manage your cholesterol levels.  Making food choices that are high in fiber and low in saturated fat, trans fat, and cholesterol may control cholesterol levels.  Take any prescribed medicines to control cholesterol as directed by your health care provider.  Manage your diabetes.  Controlling your carbohydrate and sugar intake is recommended to manage diabetes.  Take any prescribed medicines to control diabetes as directed by your health care provider.  Control your hypertension.  Making food choices that are low in salt (sodium), saturated fat, trans fat, and cholesterol is recommended to manage hypertension.  Take any prescribed medicines to control hypertension as directed by your health care provider.  Maintain a healthy weight.  Reducing calorie intake and making food choices that are low in sodium, saturated fat, trans fat, and cholesterol are recommended to manage weight.  Stop drug abuse.  Avoid taking birth control pills.  Talk to your health care provider about the risks of taking birth control pills if you are over 35 years old, smoke, get migraines, or have ever had a blood clot.  Get evaluated for sleep  disorders (sleep apnea).  Talk to your health care provider about getting a sleep evaluation if you snore a lot or have excessive sleepiness.  Take medicines only as directed by your health care provider.  For some people, aspirin or blood thinners (anticoagulants) are helpful in reducing the risk of forming abnormal blood clots that can lead to stroke. If you have the irregular heart rhythm of atrial fibrillation, you should be on a blood thinner unless there is a good reason you cannot take them.  Understand all your medicine instructions.  Make sure that other conditions (such as anemia or atherosclerosis) are addressed. SEEK IMMEDIATE MEDICAL CARE IF:   You have sudden weakness or numbness of the face, arm, or leg, especially on one side of the body.  Your face or eyelid droops to one side.  You have sudden confusion.  You have trouble speaking (aphasia) or understanding.  You have sudden trouble seeing in one or both eyes.  You have sudden trouble walking.  You have dizziness.  You have a loss of balance or coordination.  You have a sudden, severe headache with no known cause.  You have new chest pain or an irregular heartbeat. Any of these symptoms may represent a serious problem that is an emergency. Do not wait to see if the symptoms will go away. Get medical help at once. Call your local emergency services (911 in [place] Do not drive yourself to the hospital. Document Released: 08/19/2004 Document Revised: 11/26/2013 Document Reviewed: 01/12/2013 ExitCare Patient Information 2015 [employer]. This information is not intended to replace advice given   to you by your health care provider. Make sure you discuss any questions you have with your health care provider.   How Much is Too Much Alcohol? Drinking too much alcohol can cause injury, accidents, and health problems. These types of problems can include:   Car crashes.  Falls.  Family fighting (domestic  violence).  Drowning.  Fights.  Injuries.  Burns.  Damage to certain organs.  Having a baby with birth defects. ONE DRINK CAN BE TOO MUCH WHEN YOU ARE:  Working.  Pregnant or breastfeeding.  Taking medicines. Ask your doctor.  Driving or planning to drive. WHAT IS A STANDARD DRINK?   1 regular beer (12 ounces or 360 milliliters).  1 glass of wine (5 ounces or 150 milliliters).  1 shot of liquor (1.5 ounces or 45 milliliters). BLOOD ALCOHOL LEVELS   .00 A person is sober.  Marland Kitchen.03 A person has no trouble keeping balance, talking, or seeing right, but a "buzz" may be felt.  Marland Kitchen.05 A person feels "buzzed" and relaxed.  Marland Kitchen.08 or .10  A person is drunk. He or she has trouble talking, seeing right, and keeping his or her balance.  .15 A person loses body control and may pass out (blackout).  .20 A person has trouble walking (staggering) and throws up (vomits).  .30 A person will pass out (unconscious).  .40+ A person will be in a coma. Death is possible. If you or someone you know has a drinking problem, get help from a doctor.  Document Released: 05/08/2009 Document Revised: 10/04/2011 Document Reviewed: 05/08/2009 [facility] Patient Information 2015 [place], [place]. This information is not intended to replace advice given to you by your health care provider. Make sure you discuss any questions you have with your health care provider.

## 2014-05-22 NOTE — Progress Notes (Addendum)
Established Carotid Patient   History of Present Illness  Charles Bridges is a 78 y.o. male patient of Dr. Arbie CookeyEarly who is status post right carotid endarterectomy in 2001. He returns today for follow up. His PCP is Dr. Val RilesHammerick (sp?) [place]. The patient reports having 2 strokes before the right CEA, a year apart as manifested by left hemiparesis and slurred speech, does not recall if he had monocular loss of vision, he denies any further stroke or TIA.   The patient reports New Medical or Surgical History: he fell 2 weeks ago on his left side, having left flank pain.  Pt Diabetic:wife states pt was on metformin in the past, his medical Dr. Kem ParkinsonStopped metformin Pt smoker: former smoker, quit in 2005 The patient admits to drinking 28 cans of beer/week  Pt meds include: Statin : Yes ASA: No Other anticoagulants/antiplatelets: coumadin prescribed by his PCP, coumadin started after the CEA, per pt, he does not seem to recall if he is taking Plavix any longer   Past Medical History  Diagnosis Date  . Carotid artery occlusion   . Hypertension   . Hyperlipidemia   . Diabetes mellitus 2006  . Arthritis   . Stroke 1998 and  1999  . Ulcer     Hiatal Hernia / Gastric ulcers  . History of vagotomy   . Chronic diarrhea   . Fall against object Oct. 16, 2015    Pt got tangled up with dog chain out side in the woods.    Social History History  Substance Use Topics  . Smoking status: Former Smoker    Types: Cigarettes    Quit date: 07/27/1999  . Smokeless tobacco: Never Used  . Alcohol Use: Yes    Family History Family History  Problem Relation Age of Onset  . Cancer Mother     breast cancer  . Other Mother     Circulation problems and lower extremity amputation  . Other Father     cerebrovascular accident    Surgical History Past Surgical History  Procedure Laterality Date  . Carotid endarterectomy  03/02/2000    Right CEA  . Fracture surgery      Right Shoulder     Not on File  Current Outpatient Prescriptions  Medication Sig Dispense Refill  . atorvastatin (LIPITOR) 10 MG tablet Take 10 mg by mouth daily.      . clopidogrel (PLAVIX) 300 MG TABS Take 300 mg by mouth once.      . hydrochlorothiazide (HYDRODIURIL) 25 MG tablet Take 25 mg by mouth daily.      . sulindac (CLINORIL) 200 MG tablet Take 200 mg by mouth 2 (two) times daily.       No current facility-administered medications for this visit.    Review of Systems : See HPI for pertinent positives and negatives.  Physical Examination  Filed Vitals:   05/22/14 1205 05/22/14 1208  BP: 144/61 145/62  Pulse: 63 68  Resp:  16  Height:  6' 1.5" (1.867 m)  Weight:  226 lb (102.513 kg)  SpO2:  95%   Body mass index is 29.41 kg/(m^2).  General: WDWN male in NAD, ruddy face GAIT: normal Eyes: PERRLA, sclerae of both eyes are reddened. Pulmonary:  Non-labored, diminished air movement in all fields, moist cough, Negative  Rales, Negative rhonchi, & Negative wheezing.  Cardiac: regular Rhythm,  Negative detected murmur.  VASCULAR EXAM Carotid Bruits Right Left   Negative Positive, soft    Aorta is  not palpable. Radial pulses are 2+ palpable and equal.                                                                                                                            LE Pulses Right Left       POPLITEAL  not palpable   not palpable       POSTERIOR TIBIAL   palpable    palpable        DORSALIS PEDIS      ANTERIOR TIBIAL not palpable  not palpable     Gastrointestinal: soft, nontender, BS WNL, no r/g,  negative palpated masses, large panus.  Musculoskeletal: Negative muscle atrophy/wasting. M/S 5/5 throughout, Extremities without ischemic changes. Ecchymotic left lower flank, states he fell on a barrel 2 weeks ago.  Neurologic: A&O X 3; restless, Speech is normal CN 2-12 intact except is hard of hearing, Pain and light touch intact in extremities, Motor exam as listed  above.   Non-Invasive Vascular Imaging CAROTID DUPLEX 05/22/2014   CEREBROVASCULAR DUPLEX EVALUATION    INDICATION: Carotid stenosis    PREVIOUS INTERVENTION(S): Right carotid endarterectomy 03/02/2000    DUPLEX EXAM:     RIGHT  LEFT  Peak Systolic Velocities (cm/s) End Diastolic Velocities (cm/s) Plaque LOCATION Peak Systolic Velocities (cm/s) End Diastolic Velocities (cm/s) Plaque  78 10  CCA PROXIMAL 127 13   112 7  CCA MID 224 17 HT  230 16 HM CCA DISTAL 194 14 HT  196 9  ECA 339 27 CP  102 12  ICA PROXIMAL 178 13 CP  157 20 HT ICA MID 135 20   94 16  ICA DISTAL 118 16     carotid endarterectomy ICA / CCA Ratio (PSV) CCA disease  Antegrade Vertebral Flow Antegrade  182 Brachial Systolic Pressure (mmHg) 182  Triphasic Brachial Artery Waveforms Triphasic    Plaque Morphology:  HM = Homogeneous, HT = Heterogeneous, CP = Calcific Plaque, SP = Smooth Plaque, IP = Irregular Plaque  ADDITIONAL FINDINGS:     IMPRESSION: Technically difficult due to patient restlessness and inability to rotate head. Right internal carotid artery is patent with history of carotid endarterectomy, mild hyperplasia present at the proximal patch suggestive of less than 40% stenosis. Left common carotid artery disease present. Left internal carotid artery stenosis present of less than 40%, however may be underestimated due to dense calcific plaque present making Doppler interrogation difficult. Bilateral external carotid artery stenosis present.    Compared to the previous exam:  Increase on the right and stable on the left since previous study on 03/07/2013.      Assessment: Charles CannerJohn E Bridges is a [age] y.o. male who is s/p right carotid endarterectomy 03/02/2000. The patient reports having 2 strokes before the right CEA, a year apart as manifested by left hemiparesis and slurred speech, does not recall if he had monocular loss of vision, he denies any further stroke or TIA.  Today's carotid Duplex  reveals a technically difficult Duplex due to patient restlessness and inability to rotate head. Right internal carotid artery is patent with history of carotid endarterectomy, mild hyperplasia present at the proximal patch suggestive of less than 40% stenosis. Left common carotid artery disease present. Left internal carotid artery stenosis present of less than 40%, however may be underestimated due to dense calcific plaque present making Doppler interrogation difficult. Bilateral external carotid artery stenosis present. Increase on the right and stable on the left since previous study on 03/07/2013. The patient admits to drinking 28 cans of beer/week.    Plan: Follow-up in 1 year with Carotid Duplex scan.  The patient was advised to see his PCP ASAP re being evaluated after his fall, pain in left flank.  I discussed in depth with the patient the nature of atherosclerosis, and emphasized the importance of maximal medical management including strict control of blood pressure, blood glucose, and lipid levels, obtaining regular exercise, and continued cessation of smoking.  The patient is aware that without maximal medical management the underlying atherosclerotic disease process will progress, limiting the benefit of any interventions. The patient was given information about stroke prevention and what symptoms should prompt the patient to seek immediate medical care. Thank you for allowing [place] to participate in this patient's care.  Charisse March, RN, MSN, FNP-C [facility] Office: [phone]  Clinic Physician: Edilia Bo  05/22/2014 12:21 PM

## 2014-06-12 ENCOUNTER — Encounter: Payer: Self-pay | Admitting: Neurology

## 2014-06-18 ENCOUNTER — Encounter: Payer: Self-pay | Admitting: Neurology

## 2015-04-13 ENCOUNTER — Inpatient Hospital Stay (HOSPITAL_COMMUNITY)
Admission: AD | Admit: 2015-04-13 | Discharge: 2015-04-22 | DRG: 471 | Disposition: A | Discharge status: Skilled Nursing Facility | Payer: Medicare Other | Source: Other Acute Inpatient Hospital | Attending: Internal Medicine | Admitting: Internal Medicine

## 2015-04-13 ENCOUNTER — Inpatient Hospital Stay (HOSPITAL_COMMUNITY): Payer: Medicare Other

## 2015-04-13 DIAGNOSIS — Z7902 Long term (current) use of antithrombotics/antiplatelets: Secondary | ICD-10-CM | POA: Diagnosis not present

## 2015-04-13 DIAGNOSIS — S14124A Central cord syndrome at C4 level of cervical spinal cord, initial encounter: Secondary | ICD-10-CM | POA: Diagnosis present

## 2015-04-13 DIAGNOSIS — M25519 Pain in unspecified shoulder: Secondary | ICD-10-CM

## 2015-04-13 DIAGNOSIS — Z79899 Other long term (current) drug therapy: Secondary | ICD-10-CM | POA: Diagnosis not present

## 2015-04-13 DIAGNOSIS — F1990 Other psychoactive substance use, unspecified, uncomplicated: Secondary | ICD-10-CM | POA: Diagnosis present

## 2015-04-13 DIAGNOSIS — D649 Anemia, unspecified: Secondary | ICD-10-CM | POA: Diagnosis not present

## 2015-04-13 DIAGNOSIS — F1010 Alcohol abuse, uncomplicated: Secondary | ICD-10-CM

## 2015-04-13 DIAGNOSIS — E785 Hyperlipidemia, unspecified: Secondary | ICD-10-CM | POA: Diagnosis present

## 2015-04-13 DIAGNOSIS — Z8711 Personal history of peptic ulcer disease: Secondary | ICD-10-CM | POA: Diagnosis not present

## 2015-04-13 DIAGNOSIS — S2232XA Fracture of one rib, left side, initial encounter for closed fracture: Secondary | ICD-10-CM | POA: Diagnosis not present

## 2015-04-13 DIAGNOSIS — G9340 Encephalopathy, unspecified: Secondary | ICD-10-CM | POA: Diagnosis not present

## 2015-04-13 DIAGNOSIS — M1990 Unspecified osteoarthritis, unspecified site: Secondary | ICD-10-CM | POA: Diagnosis present

## 2015-04-13 DIAGNOSIS — M4802 Spinal stenosis, cervical region: Principal | ICD-10-CM | POA: Diagnosis present

## 2015-04-13 DIAGNOSIS — S14109A Unspecified injury at unspecified level of cervical spinal cord, initial encounter: Secondary | ICD-10-CM | POA: Diagnosis present

## 2015-04-13 DIAGNOSIS — F10239 Alcohol dependence with withdrawal, unspecified: Secondary | ICD-10-CM | POA: Diagnosis present

## 2015-04-13 DIAGNOSIS — S12300A Unspecified displaced fracture of fourth cervical vertebra, initial encounter for closed fracture: Secondary | ICD-10-CM | POA: Diagnosis not present

## 2015-04-13 DIAGNOSIS — R000 Tachycardia, unspecified: Secondary | ICD-10-CM | POA: Diagnosis present

## 2015-04-13 DIAGNOSIS — R208 Other disturbances of skin sensation: Secondary | ICD-10-CM | POA: Diagnosis present

## 2015-04-13 DIAGNOSIS — J9600 Acute respiratory failure, unspecified whether with hypoxia or hypercapnia: Secondary | ICD-10-CM | POA: Diagnosis not present

## 2015-04-13 DIAGNOSIS — W19XXXA Unspecified fall, initial encounter: Secondary | ICD-10-CM | POA: Diagnosis present

## 2015-04-13 DIAGNOSIS — S12400A Unspecified displaced fracture of fifth cervical vertebra, initial encounter for closed fracture: Secondary | ICD-10-CM | POA: Diagnosis present

## 2015-04-13 DIAGNOSIS — J95821 Acute postprocedural respiratory failure: Secondary | ICD-10-CM | POA: Diagnosis not present

## 2015-04-13 DIAGNOSIS — G8250 Quadriplegia, unspecified: Secondary | ICD-10-CM | POA: Diagnosis present

## 2015-04-13 DIAGNOSIS — I10 Essential (primary) hypertension: Secondary | ICD-10-CM | POA: Diagnosis present

## 2015-04-13 DIAGNOSIS — J690 Pneumonitis due to inhalation of food and vomit: Secondary | ICD-10-CM | POA: Diagnosis not present

## 2015-04-13 DIAGNOSIS — E119 Type 2 diabetes mellitus without complications: Secondary | ICD-10-CM | POA: Diagnosis not present

## 2015-04-13 DIAGNOSIS — E871 Hypo-osmolality and hyponatremia: Secondary | ICD-10-CM | POA: Diagnosis present

## 2015-04-13 DIAGNOSIS — Z87891 Personal history of nicotine dependence: Secondary | ICD-10-CM | POA: Diagnosis not present

## 2015-04-13 DIAGNOSIS — N179 Acute kidney failure, unspecified: Secondary | ICD-10-CM | POA: Diagnosis not present

## 2015-04-13 DIAGNOSIS — Z01818 Encounter for other preprocedural examination: Secondary | ICD-10-CM

## 2015-04-13 DIAGNOSIS — R200 Anesthesia of skin: Secondary | ICD-10-CM

## 2015-04-13 DIAGNOSIS — S2239XA Fracture of one rib, unspecified side, initial encounter for closed fracture: Secondary | ICD-10-CM

## 2015-04-13 DIAGNOSIS — Z9289 Personal history of other medical treatment: Secondary | ICD-10-CM

## 2015-04-13 DIAGNOSIS — S2231XA Fracture of one rib, right side, initial encounter for closed fracture: Secondary | ICD-10-CM | POA: Diagnosis not present

## 2015-04-13 DIAGNOSIS — G459 Transient cerebral ischemic attack, unspecified: Secondary | ICD-10-CM | POA: Diagnosis present

## 2015-04-13 DIAGNOSIS — Z8673 Personal history of transient ischemic attack (TIA), and cerebral infarction without residual deficits: Secondary | ICD-10-CM

## 2015-04-13 DIAGNOSIS — S129XXA Fracture of neck, unspecified, initial encounter: Secondary | ICD-10-CM

## 2015-04-13 DIAGNOSIS — Z419 Encounter for procedure for purposes other than remedying health state, unspecified: Secondary | ICD-10-CM

## 2015-04-13 LAB — CBC
HEMATOCRIT: 35.4 % — AB (ref 39.0–52.0)
HEMOGLOBIN: 12.8 g/dL — AB (ref 13.0–17.0)
MCH: 33.3 pg (ref 26.0–34.0)
MCHC: 36.2 g/dL — ABNORMAL HIGH (ref 30.0–36.0)
MCV: 92.2 fL (ref 78.0–100.0)
Platelets: 160 10*3/uL (ref 150–400)
RBC: 3.84 MIL/uL — AB (ref 4.22–5.81)
RDW: 13.3 % (ref 11.5–15.5)
WBC: 9.7 10*3/uL (ref 4.0–10.5)

## 2015-04-13 LAB — CREATININE, SERUM: Creatinine, Ser: 1.04 mg/dL (ref 0.61–1.24)

## 2015-04-13 LAB — OSMOLALITY: OSMOLALITY: 279 mosm/kg (ref 275–300)

## 2015-04-13 MED ORDER — BISACODYL 10 MG RE SUPP: 10.0000 mg | Freq: Every day | RECTAL | Status: DC | PRN

## 2015-04-13 MED ORDER — PIPERACILLIN-TAZOBACTAM 3.375 G IVPB
3.3750 g | Freq: Three times a day (TID) | INTRAVENOUS | Status: AC
  Administered 2015-04-13 – 2015-04-19 (×18): 3.375 g via INTRAVENOUS
  Filled 2015-04-13 (×22): qty 50

## 2015-04-13 MED ORDER — HEPARIN SODIUM (PORCINE) 5000 UNIT/ML IJ SOLN
5000.0000 [IU] | Freq: Three times a day (TID) | INTRAMUSCULAR | Status: DC
  Administered 2015-04-13 – 2015-04-14 (×2): 5000 [IU] via SUBCUTANEOUS
  Filled 2015-04-13 (×2): qty 1

## 2015-04-13 MED ORDER — PRAVASTATIN SODIUM 40 MG PO TABS
40.0000 mg | ORAL_TABLET | Freq: Every day | ORAL | Status: DC
  Administered 2015-04-14 – 2015-04-21 (×6): 40 mg via ORAL
  Filled 2015-04-13 (×6): qty 1

## 2015-04-13 MED ORDER — SODIUM CHLORIDE 0.9 % IV SOLN
INTRAVENOUS | Status: DC
  Administered 2015-04-13 – 2015-04-15 (×2): via INTRAVENOUS

## 2015-04-13 MED ORDER — VANCOMYCIN HCL 10 G IV SOLR
1750.0000 mg | Freq: Once | INTRAVENOUS | Status: AC
  Administered 2015-04-13: 1750 mg via INTRAVENOUS
  Filled 2015-04-13: qty 1750

## 2015-04-13 MED ORDER — ALLOPURINOL 300 MG PO TABS
300.0000 mg | ORAL_TABLET | Freq: Every day | ORAL | Status: DC
  Administered 2015-04-14 – 2015-04-15 (×2): 300 mg via ORAL
  Filled 2015-04-13 (×2): qty 1

## 2015-04-13 MED ORDER — SODIUM CHLORIDE 0.9 % IJ SOLN
3.0000 mL | Freq: Two times a day (BID) | INTRAMUSCULAR | Status: DC
  Administered 2015-04-13 – 2015-04-14 (×3): 3 mL via INTRAVENOUS

## 2015-04-13 MED ORDER — THIAMINE HCL 100 MG/ML IJ SOLN
100.0000 mg | Freq: Every day | INTRAMUSCULAR | Status: DC
  Administered 2015-04-15: 100 mg via INTRAVENOUS
  Filled 2015-04-13: qty 2

## 2015-04-13 MED ORDER — WARFARIN SODIUM 7.5 MG PO TABS: 7.5000 mg | ORAL_TABLET | Freq: Once | ORAL | Status: DC

## 2015-04-13 MED ORDER — WARFARIN - PHARMACIST DOSING INPATIENT: Freq: Every day | Status: DC

## 2015-04-13 MED ORDER — LORAZEPAM 2 MG/ML IJ SOLN
0.0000 mg | Freq: Two times a day (BID) | INTRAMUSCULAR | Status: DC
Start: 2015-04-15 — End: 2015-04-15

## 2015-04-13 MED ORDER — FOLIC ACID 1 MG PO TABS
1.0000 mg | ORAL_TABLET | Freq: Every day | ORAL | Status: DC
  Administered 2015-04-14 – 2015-04-22 (×9): 1 mg via ORAL
  Filled 2015-04-13 (×10): qty 1

## 2015-04-13 MED ORDER — PRAVASTATIN SODIUM 40 MG PO TABS: 40.0000 mg | ORAL_TABLET | Freq: Every day | ORAL | Status: DC

## 2015-04-13 MED ORDER — ACETAMINOPHEN 325 MG PO TABS: 650.0000 mg | ORAL_TABLET | Freq: Four times a day (QID) | ORAL | Status: DC | PRN

## 2015-04-13 MED ORDER — LORAZEPAM 2 MG/ML IJ SOLN
0.0000 mg | Freq: Four times a day (QID) | INTRAMUSCULAR | Status: DC
  Administered 2015-04-13 – 2015-04-15 (×5): 1 mg via INTRAVENOUS
  Filled 2015-04-13 (×5): qty 1

## 2015-04-13 MED ORDER — ACETAMINOPHEN 650 MG RE SUPP: 650.0000 mg | Freq: Four times a day (QID) | RECTAL | Status: DC | PRN

## 2015-04-13 MED ORDER — MORPHINE SULFATE (PF) 2 MG/ML IV SOLN
1.0000 mg | INTRAVENOUS | Status: DC | PRN
  Administered 2015-04-13 – 2015-04-15 (×5): 1 mg via INTRAVENOUS
  Filled 2015-04-13 (×5): qty 1

## 2015-04-13 MED ORDER — VITAMIN B-1 100 MG PO TABS
100.0000 mg | ORAL_TABLET | Freq: Every day | ORAL | Status: DC
  Administered 2015-04-14: 100 mg via ORAL
  Filled 2015-04-13: qty 1

## 2015-04-13 MED ORDER — LORAZEPAM 1 MG PO TABS
1.0000 mg | ORAL_TABLET | Freq: Four times a day (QID) | ORAL | Status: DC | PRN
  Administered 2015-04-14: 1 mg via ORAL
  Filled 2015-04-13: qty 1

## 2015-04-13 MED ORDER — ADULT MULTIVITAMIN W/MINERALS CH
1.0000 | ORAL_TABLET | Freq: Every day | ORAL | Status: DC
  Administered 2015-04-14 – 2015-04-22 (×9): 1 via ORAL
  Filled 2015-04-13 (×10): qty 1

## 2015-04-13 MED ORDER — LORAZEPAM 2 MG/ML IJ SOLN
1.0000 mg | Freq: Four times a day (QID) | INTRAMUSCULAR | Status: DC | PRN
  Administered 2015-04-13 – 2015-04-15 (×2): 1 mg via INTRAVENOUS
  Filled 2015-04-13 (×2): qty 1

## 2015-04-13 MED ORDER — VANCOMYCIN HCL IN DEXTROSE 1-5 GM/200ML-% IV SOLN
1000.0000 mg | Freq: Two times a day (BID) | INTRAVENOUS | Status: DC
Start: 2015-04-14 — End: 2015-04-15
  Administered 2015-04-14 – 2015-04-15 (×3): 1000 mg via INTRAVENOUS
  Filled 2015-04-13 (×5): qty 200

## 2015-04-13 MED ORDER — METOPROLOL TARTRATE 50 MG PO TABS
50.0000 mg | ORAL_TABLET | Freq: Two times a day (BID) | ORAL | Status: DC
  Administered 2015-04-14 – 2015-04-22 (×14): 50 mg via ORAL
  Filled 2015-04-13 (×14): qty 1

## 2015-04-13 MED ORDER — HYDRALAZINE HCL 20 MG/ML IJ SOLN
10.0000 mg | INTRAMUSCULAR | Status: DC | PRN
  Administered 2015-04-14: 10 mg via INTRAVENOUS
  Filled 2015-04-13: qty 1

## 2015-04-13 MED ORDER — CLOPIDOGREL BISULFATE 75 MG PO TABS: 300.0000 mg | ORAL_TABLET | Freq: Once | ORAL | Status: DC

## 2015-04-13 MED ORDER — ATORVASTATIN CALCIUM 10 MG PO TABS: 10.0000 mg | ORAL_TABLET | Freq: Every day | ORAL | Status: DC

## 2015-04-13 MED ORDER — METOPROLOL TARTRATE 1 MG/ML IV SOLN
5.0000 mg | Freq: Once | INTRAVENOUS | Status: AC
  Administered 2015-04-13: 5 mg via INTRAVENOUS
  Filled 2015-04-13: qty 5

## 2015-04-13 NOTE — Progress Notes (Signed)
Just spoke w/ MRI department. States is arranging for transport pickup at this time for pt to go to MRI and complete scans of brain & cervical spine.

## 2015-04-13 NOTE — Progress Notes (Signed)
ANTIBIOTIC CONSULT NOTE - INITIAL  Pharmacy Consult for vancomycin and Zosyn and warfarin Indication: pneumonia  No Known Allergies  Patient Measurements: Height:  (185.4 cm) Weight: 227 lb 6.4 oz (103.148 kg) IBW/kg (Calculated) : 79.9   Vital Signs: Temp: 97.3 F (36.3 C) (09/18 1717) Temp Source: Oral (09/18 1717) BP: 186/82 mmHg (09/18 1717) Pulse Rate: 102 (09/18 1717) Intake/Output from previous day:   Intake/Output from this shift:    Labs: Creatinine from OSH is 0.89.  INR is 1.51.     Recent Labs  04/13/15 1842  WBC 9.7  HGB 12.8*  PLT 160   CrCl cannot be calculated (Patient has no serum creatinine result on file.). No results for input(s): VANCOTROUGH, VANCOPEAK, VANCORANDOM, GENTTROUGH, GENTPEAK, GENTRANDOM, TOBRATROUGH, TOBRAPEAK, TOBRARND, AMIKACINPEAK, AMIKACINTROU, AMIKACIN in the last 72 hours.   Microbiology: No results found for this or any previous visit (from the past 720 hour(s)).  Medical History: Past Medical History  Diagnosis Date  . Carotid artery occlusion   . Hypertension   . Hyperlipidemia   . Diabetes mellitus 2006  . Arthritis   . Stroke 1998 and  1999  . Ulcer     Hiatal Hernia / Gastric ulcers  . History of vagotomy   . Chronic diarrhea   . Fall against object Oct. 16, 2015    Pt got tangled up with dog chain out side in the woods.    Medications:  Prescriptions prior to admission  Medication Sig Dispense Refill Last Dose  . allopurinol (ZYLOPRIM) 300 MG tablet Take 300 mg by mouth daily.   Past Week at Unknown time  . lisinopril-hydrochlorothiazide (PRINZIDE,ZESTORETIC) 20-12.5 MG per tablet Take 1 tablet by mouth daily.   Past Week at Unknown time  . pravastatin (PRAVACHOL) 40 MG tablet Take 40 mg by mouth daily.   Past Week at Unknown time  . warfarin (COUMADIN) 5 MG tablet Take 5 mg by mouth daily.   Past Week at Unknown time  . hydrochlorothiazide (HYDRODIURIL) 25 MG tablet Take 25 mg by mouth daily.   Taking   . sulindac (CLINORIL) 200 MG tablet Take 200 mg by mouth 2 (two) times daily.   Taking   Assessment: 79 year old man transferred to [facility] from [facility] for further evaluation.  Zosyn and vancomycin to start for aspiration pneumonia and warfarin to continue (on for history of CVA per wife)  Goal of Therapy:  Vancomycin trough level 15-20 mcg/ml  INR 2-3  Plan:  Measure antibiotic drug levels at steady state Follow up culture results Vancomycin  x 1 then Vancomycin 1g IV q12h  Zosyn 3.375g IV q8h (infuse over 4 hours) Warfarin 7.5mg  po x 1 - daily protimes  Diamond, Martucci 04/13/2015,6:57 PM

## 2015-04-13 NOTE — Progress Notes (Signed)
Pt has been drowsy since RN came onto shift. Awakes for a couple seconds and goes back to resting with eyes closed. Unable to administer PO meds after multiple attempts, coumadin included. Paged Dr on call at this time to notify them. Requesting meds to be changed to IV if possible, such as metoprolol. Waiting to hear back from Dr.

## 2015-04-13 NOTE — Progress Notes (Signed)
04/13/15 Patient arrived from Outside hospital to Rm 5W26, very sleepy and sore.Call placed to physician for orders.

## 2015-04-13 NOTE — Progress Notes (Signed)
Attempted x2 to speak with patient to complete admission questions though pt too drowsy to cooperate at this time. Pt goes back to resting state w eyes closed.

## 2015-04-13 NOTE — Progress Notes (Signed)
79 year old with h/o hypertension, hyperlipidemia, PUD, h/o CVA in the past, alcohol abuse, had a fall yesterday and was seen at [facility] .  He underwent imaging of his pelvis and CT head and cervical spine, which were all negative for acute fractures.  But earlier this am, patient started having right arm paraesthesias, and weakness, appears to have improved, and referred to [facility] for further evaluation with an MRI of the cervical spine and MRI of the brain.  His vitals as per the EDP are all stable. He was accepted to telemetry at [facility].   Kathlen Mody, MD [phone]

## 2015-04-13 NOTE — H&P (Signed)
[facility] History and Physical  KHAN CHURA ZOX:[id number] DOB: [birth year] DOA: 04/13/2015  Referring physician: [facility] PCP: Ailene Ravel, MD  Specialists:   Chief Complaint: Falls  HPI: Charles Bridges is a 79 y.o. male  With hx PUD, hx CVA, ETOH abuse, HTN, hld who presented to [facility] s/p fall. In the ED, pt was noted to have elevated 169, sodium 127, rib fractures. Pt reportedly had unremarkable CT head and cervical spine. Per report after falling, pt began to experience worsening R arm parasthesias and weakness. Pt was transferred to [facility] for further work up.  Review of Systems:  Review of Systems  Constitutional: Negative for fever and chills.  HENT: Negative for ear pain.   Eyes: Negative for pain and redness.  Respiratory: Negative for shortness of breath, wheezing and stridor.   Cardiovascular: Negative for chest pain and claudication.  Gastrointestinal: Negative for diarrhea and constipation.  Genitourinary: Negative for frequency and hematuria.  Musculoskeletal: Positive for joint pain and falls.  Skin: Negative for itching and rash.  Neurological: Positive for tingling. Negative for seizures.  Psychiatric/Behavioral: Negative for memory loss and substance abuse.     Past Medical History  Diagnosis Date  . Carotid artery occlusion   . Hypertension   . Hyperlipidemia   . Diabetes mellitus 2006  . Arthritis   . Stroke 1998 and  1999  . Ulcer     Hiatal Hernia / Gastric ulcers  . History of vagotomy   . Chronic diarrhea   . Fall against object Oct. 16, 2015    Pt got tangled up with dog chain out side in the woods.   Past Surgical History  Procedure Laterality Date  . Carotid endarterectomy  03/02/2000    Right CEA  . Fracture surgery      Right Shoulder   Social History:  reports that he quit smoking about 15 years ago. His smoking use included Cigarettes. He has never used smokeless tobacco. He  reports that he drinks alcohol. He reports that he does not use illicit drugs.  where does patient live--home, ALF, SNF? and with whom if at home?  Can patient participate in ADLs?  Not on File  Family History  Problem Relation Age of Onset  . Cancer Mother     breast cancer  . Other Mother     Circulation problems and lower extremity amputation  . Other Father     cerebrovascular accident    (be sure to complete)  Prior to Admission medications   Medication Sig Start Date End Date Taking? Authorizing Provider  atorvastatin (LIPITOR) 10 MG tablet Take 10 mg by mouth daily.    Historical Provider, MD  clopidogrel (PLAVIX) 300 MG TABS Take 300 mg by mouth once.    Historical Provider, MD  hydrochlorothiazide (HYDRODIURIL) 25 MG tablet Take 25 mg by mouth daily.    Historical Provider, MD  sulindac (CLINORIL) 200 MG tablet Take 200 mg by mouth 2 (two) times daily.    Historical Provider, MD   Physical Exam: Filed Vitals:   04/13/15 1630 04/13/15 1717  BP:  186/82  Pulse:  102  Temp:  97.3 F (36.3 C)  TempSrc:  Oral  Resp:  20  Height:  (1.854 m)   Weight: 103.148 kg (227 lb 6.4 oz)   SpO2:  94%     General:  Awake, in nad  Eyes: regular, s1, s2  ENT: membranes moist, dentition fair  Neck: trachea  midline, neck supple  Cardiovascular: regular, s1, s2  Respiratory: normal resp effort, no wheezing  Abdomen: soft,nondistended  Skin: perfused, no clubbing  Musculoskeletal: perfused, no clubbing  Psychiatric: mood/affect normal// no auditory/visual hallucinations  Neurologic: cn2-12 grossly intact, B UE parasthesias  Labs on Admission:  Basic Metabolic Panel: No results for input(s): NA, K, CL, CO2, GLUCOSE, BUN, CREATININE, CALCIUM, MG, PHOS in the last 168 hours. Liver Function Tests: No results for input(s): AST, ALT, ALKPHOS, BILITOT, PROT, ALBUMIN in the last 168 hours. No results for input(s): LIPASE, AMYLASE in the last 168 hours. No results for  input(s): AMMONIA in the last 168 hours. CBC: No results for input(s): WBC, NEUTROABS, HGB, HCT, MCV, PLT in the last 168 hours. Cardiac Enzymes: No results for input(s): CKTOTAL, CKMB, CKMBINDEX, TROPONINI in the last 168 hours.  BNP (last 3 results) No results for input(s): BNP in the last 8760 hours.  ProBNP (last 3 results) No results for input(s): PROBNP in the last 8760 hours.  CBG: No results for input(s): GLUCAP in the last 168 hours.  Radiological Exams on Admission: No results found.   Assessment/Plan Principal Problem:   Sensory loss Active Problems:   Essential hypertension   HLD (hyperlipidemia)   Rib fracture   History of CVA (cerebrovascular accident)   Falls   Alcohol abuse   Hyponatremia   1. Sensory loss s/p fall 1. MRI neck and brain ordered 2. Will consult PT 2. HTN 1. BP poorly controlled 2. ?secondary to pain 3. Will cont on metoprolol for now given tachycardia 4. Add PRN hydralazine 3. HLD 1. Cont on statin 4. Rib fracture 1. Analgesics as tolerated 5. Hx CVA 1. Seems stable 2. MRI brain and neck per above 6. ETOH abuse 1. ETOH level of 169 at OSH 2. Will keep on CIWA 7. Hyponatremia 1. Possibly secondary to ETOH 2. Will check serum and urine OSM as well as urine lytes 3. Hold HCTZ for now 8. Possible PNA 1. Noted on OSH CXR 2. Given ETOH abuse hx, consideration for possible aspiration. Will start empiric vanc and zosyn 9. DVT prophylaxis 1. Heparin subQ  Code Status: Full (must indicate code status--if unknown or must be presumed, indicate so) Family Communication: Pt in room, family at bedside Disposition Plan: Admit med-tele (indicate anticipated LOS)   CHIU, STEPHEN K [facility] Pager [phone]  If 7PM-7AM, please contact night-coverage [email or web address] Password [facility] 04/13/2015, 5:55 PM

## 2015-04-14 ENCOUNTER — Inpatient Hospital Stay (HOSPITAL_COMMUNITY): Payer: Medicare Other

## 2015-04-14 ENCOUNTER — Encounter (HOSPITAL_COMMUNITY): Payer: Self-pay | Admitting: General Practice

## 2015-04-14 LAB — GLUCOSE, CAPILLARY: Glucose-Capillary: 153 mg/dL — ABNORMAL HIGH (ref 65–99)

## 2015-04-14 LAB — BASIC METABOLIC PANEL
ANION GAP: 7 (ref 5–15)
BUN: 20 mg/dL (ref 6–20)
CALCIUM: 8.3 mg/dL — AB (ref 8.9–10.3)
CO2: 20 mmol/L — AB (ref 22–32)
Chloride: 102 mmol/L (ref 101–111)
Creatinine, Ser: 1.02 mg/dL (ref 0.61–1.24)
GFR calc Af Amer: >60 mL/min (ref >60)
GFR calc non Af Amer: >60 mL/min (ref >60)
GLUCOSE: 117 mg/dL — AB (ref 65–99)
POTASSIUM: 4.1 mmol/L (ref 3.5–5.1)
Sodium: 129 mmol/L — ABNORMAL LOW (ref 135–145)

## 2015-04-14 LAB — PROTIME-INR
INR: 1.55 — AB (ref 0.00–1.49)
Prothrombin Time: 18.7 seconds — ABNORMAL HIGH (ref 11.6–15.2)

## 2015-04-14 MED ORDER — DEXAMETHASONE 4 MG PO TABS
4.0000 mg | ORAL_TABLET | Freq: Four times a day (QID) | ORAL | Status: DC
  Administered 2015-04-14 – 2015-04-15 (×3): 4 mg via ORAL
  Filled 2015-04-14 (×7): qty 1

## 2015-04-14 MED ORDER — INFLUENZA VAC SPLIT QUAD 0.5 ML IM SUSY
0.5000 mL | PREFILLED_SYRINGE | INTRAMUSCULAR | Status: AC
  Administered 2015-04-15: 0.5 mL via INTRAMUSCULAR
  Filled 2015-04-14: qty 0.5

## 2015-04-14 MED ORDER — LISINOPRIL 5 MG PO TABS
5.0000 mg | ORAL_TABLET | Freq: Every day | ORAL | Status: DC
  Administered 2015-04-15: 5 mg via ORAL
  Filled 2015-04-14: qty 1

## 2015-04-14 NOTE — Progress Notes (Signed)
Patient ID: Charles Bridges, male   DOB: 1936/03/27, 79 y.o.   MRN: [id number] CT reviewed. 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 The fulcrum of the injury may be at C4/5. Will need another MRI which is of better quality than the current one.  No change in his exam from this am.  Probable posterior decompression tomorrow, for central cord injury.

## 2015-04-14 NOTE — Progress Notes (Signed)
Still awaiting ct of the spine. I have contacted his nurse who will call again to CT to move the process along. Previous scans from outside facility are not in the system.

## 2015-04-14 NOTE — Care Management Note (Signed)
Case Management Note  Patient Details  Name: Charles Bridges MRN: [id number] Date of Birth: 01/04/1936  Subjective/Objective:                  1. Acute C4-5 fracture 2. HTN 3. Hyperlipidemia 4. Rib fracture 5. Hx CVA 6. ETOH abuse  Action/Plan: CM spoke to pt and family at the bedside. Pt is from home with his spouse and was independent prior to admission. Pt did not use any DME prior to admission. PT eval pending. Pt with C-Collar in place and CT of spine is still to be completed prior to possible surgery per family. Pt also has adult children who live close by and are able to provide support. Spouse states that they have no trouble affording medications. The spouse states that if he is able to go home with [facility] services, that she would be able to provide 24/7 assistance. Spouse knows that there is a also the possibility pt will need to be discharged to a SNF depending on progress. D/C plan pending at this time as pt is for possible surgery and still needs PT/OT evals to be completed. CM will continue to monitor for further discharge planning needs.   Expected Discharge Date:                  Expected Discharge Plan:  Home w Home Health Services  In-House Referral:     Discharge planning Services  CM Consult  Post Acute Care Choice:    Choice offered to:     DME Arranged:    DME Agency:     HH Arranged:    HH Agency:     Status of Service:  In process, will continue to follow  Medicare Important Message Given:    Date Medicare IM Given:    Medicare IM give by:    Date Additional Medicare IM Given:    Additional Medicare Important Message give by:     If discussed at Long Length of Stay Meetings, dates discussed:    Additional Comments:  Darcel Smalling, RN 04/14/2015, 7:19 PM

## 2015-04-14 NOTE — Progress Notes (Signed)
Pt has decreased grip in left hand. Evaluated by Dr. Franky Macho, Neuro this AM. Paged Dr. Franky Macho to inform him of numbness in left hand and decreased grip. Dr. Franky Macho doesn't think it's any change from his evaluation this AM, but has plans to take pt to surgery today. O2 sats up and down. Increased O2 rate to 3L and placed continuous O2 to monitor.  Midge Aver, RN

## 2015-04-14 NOTE — Consult Note (Signed)
Reason for Consult:C4/5 fracture Referring Physician: [name] is an 79 y.o. male.  HPI: whom fell yesterday. Was in outside ED and continued to have problems. Transferred to [place] for further evaluation.   Past Medical History  Diagnosis Date  . Carotid artery occlusion   . Hypertension   . Hyperlipidemia   . Arthritis   . Stroke 1998 and  1999  . Ulcer     Hiatal Hernia / Gastric ulcers  . History of vagotomy   . Chronic diarrhea   . Fall against object Oct. 16, 2015    Pt got tangled up with dog chain out side in the woods.  . Diabetes mellitus 2006    TYPE 2    Past Surgical History  Procedure Laterality Date  . Carotid endarterectomy  03/02/2000    Right CEA  . Fracture surgery      Right Shoulder  . Fracture right leg      Family History  Problem Relation Age of Onset  . Cancer Mother     breast cancer  . Other Mother     Circulation problems and lower extremity amputation  . Other Father     cerebrovascular accident    Social History:  reports that he quit smoking about 15 years ago. His smoking use included Cigarettes. He has never used smokeless tobacco. He reports that he drinks alcohol. He reports that he does not use illicit drugs.  Allergies: No Known Allergies  Medications: I have reviewed the patient's current medications.  Results for orders placed or performed during the hospital encounter of 04/13/15 (from the past 48 hour(s))  CBC     Status: Abnormal   Collection Time: 04/13/15  6:42 PM  Result Value Ref Range   WBC 9.7 4.0 - 10.5 K/uL   RBC 3.84 (L) 4.22 - 5.81 MIL/uL   Hemoglobin 12.8 (L) 13.0 - 17.0 g/dL   HCT 35.4 (L) 39.0 - 52.0 %   MCV 92.2 78.0 - 100.0 fL   MCH 33.3 26.0 - 34.0 pg   MCHC 36.2 (H) 30.0 - 36.0 g/dL   RDW 13.3 11.5 - 15.5 %   Platelets 160 150 - 400 K/uL  Creatinine, serum     Status: None   Collection Time: 04/13/15  6:42 PM  Result Value Ref Range   Creatinine, Ser 1.04 0.61 - 1.24 mg/dL    GFR calc non Af Amer >60 >60 mL/min   GFR calc Af Amer >60 >60 mL/min    Comment: (NOTE) The eGFR has been calculated using the CKD EPI equation. This calculation has not been validated in all clinical situations. eGFR's persistently <60 mL/min signify possible Chronic Kidney Disease.   Osmolality     Status: None   Collection Time: 04/13/15  6:42 PM  Result Value Ref Range   Osmolality 279 275 - 300 mOsm/kg    Comment: Performed at [facility]  Glucose, capillary     Status: Abnormal   Collection Time: 04/14/15  4:35 AM  Result Value Ref Range   Glucose-Capillary 153 (H) 65 - 99 mg/dL  Protime-INR     Status: Abnormal   Collection Time: 04/14/15  5:12 AM  Result Value Ref Range   Prothrombin Time 18.7 (H) 11.6 - 15.2 seconds   INR 1.55 (H) 0.00 - 1.49    Mr Brain Wo Contrast  04/14/2015   CLINICAL DATA:  Recent fall, worsening of RIGHT arm paresthesias and weakness. History of carotid  artery occlusion, hypertension, diabetes, stroke.  EXAM: MRI HEAD WITHOUT CONTRAST  TECHNIQUE: Multiplanar, multiecho pulse sequences of the brain and surrounding structures were obtained without intravenous contrast.  COMPARISON:  None.  FINDINGS: Multiple sequences are mild to moderately motion degraded.  No reduced diffusion to suggest acute ischemia. No susceptibility artifact to suggest hemorrhage. Moderate to severe ventriculomegaly with proportional enlargement of cerebral sulci and cerebellar folia. Old RIGHT basal ganglia and bilateral thalamus lacunar infarcts. No midline shift, mass effect or mass lesions. RIGHT cerebral peduncle volume loss compatible with wallerian degeneration. A few scattered subcentimeter supratentorial white matter T2 hyperintensities, and T2 hyperintense signal within the pons consistent with mild chronic small vessel ischemic disease. No midline shift, mass effect or mass lesions.  No abnormal extra-axial fluid collections. Normal major intracranial vascular flow  voids seen at the skull base.  Status post bilateral ocular lens implants. Moderate paranasal sinus mucosal thickening and probably inspissated mucus RIGHT maxillary sinus. The mastoid air cells are well aerated. No abnormal sellar expansion. No cerebellar tonsillar ectopia. No suspicious calvarial bone marrow signal. Patient is edentulous.  IMPRESSION: No acute intracranial process on this motion degraded examination.  Old RIGHT basal ganglia and bilateral thalamus infarcts.  Moderate to severe global brain atrophy. Mild chronic small vessel ischemic disease.   Electronically Signed   By: Elon Alas M.D.   On: 04/14/2015 01:44   Mr Cervical Spine Wo Contrast  04/14/2015   ADDENDUM REPORT: 04/14/2015 11:02 ADDENDUM: Original report by Dr. Dorann Lodge. Addendum by Dr. Jeralyn Ruths: Abnormal T2 hyperintensity within the spinal cord is most consistent with edema, with myelomalacia a less likely consideration. Study discussed in person with Dr. Christella Noa on 04/14/2015 at 10 a.m. Electronically Signed   By: Logan Bores M.D.   On: 04/14/2015 11:02  04/14/2015   CLINICAL DATA:  Worsening RIGHT arm paresthesias after recent fall. History of carotid artery occlusion, hypertension, diabetes and stroke.  EXAM: MRI CERVICAL SPINE WITHOUT CONTRAST  TECHNIQUE: Multiplanar, multisequence MR imaging of the cervical spine was performed. No intravenous contrast was administered.  COMPARISON:  None.  FINDINGS: Motion degraded examination, per technologist note, patient had difficulty remaining still even with sedation. Axial T2 and T2 gradient sequences nearly nondiagnostic, severely degraded sagittal STIR.  Cervical vertebral bodies and posterior elements appear intact and aligned with maintenance of cervical lordosis. However, there is mild widening of the C4-5 disc space ventrally, associated with bright STIR signal concerning for fracture, likely throughout osteophyte. Severe C5-6 disc height loss. Bridging bone marrow signal C6-7  and T1-2 consistent with auto interbody arthrodesis. No malalignment. Maintenance of cervical lordosis. Moderate to severe chronic discogenic endplate changes A1-2, I7-8. Disc desiccation C2-3 through C5-6 and, C7-T1.  Bright T2 signal within the spinal cord from C3 - C5-6, most consistent with cord edema or pre syrinx without frank syrinx. Craniocervical junction intact. Probable disruption of the anterior longitudinal ligament which is likely calcified at C4-5. Small apparent retropharyngeal versus prevertebral effusion, difficult to evaluate due to patient motion.  Severely limited axial gradient sequences. 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 Superimposed degenerative change resulting in severe canal stenosis C3-4 thru C5-6, corroborated by sagittal imaging, AP dimension the canal is approximately 6 mm. Probable neural foraminal narrowing midcervical spine, limited assessment.  IMPRESSION: Motion degraded examination, nearly nondiagnostic axial sequences.  Acute fracture through the C4-5 anterior disc space which is likely calcified, including suspected focal disruption of the anterior longitudinal ligament, concerning for hyperextension injury. Probable prevertebral versus  retropharyngeal effusion.  Spinal cord edema/pre syrinx C3 through C5-6 without frank syrinx.  Degenerative change of the cervical spine superimposed on congenital canal stenosis. Severe canal stenosis C3-4 through C5-6. Probable neural foraminal narrowing.  Recommend CT cervical spine to assess bony changes.  Electronically Signed: By: Elon Alas M.D. On: 04/14/2015 02:33    Review of Systems  Unable to perform ROS: mental acuity   Blood pressure 163/77, pulse 84, temperature 98.6 F (37 C), temperature source Oral, resp. rate 16, height $RemoveBe'6\' 1"'SMciiYhKO$  (1.854 m), weight 103.148 kg (227 lb 6.4 oz), SpO2 100 %. Physical Exam  Constitutional: He appears well-developed. He appears lethargic. He  appears distressed.  HENT:  Bruising over face, nose  Eyes: Conjunctivae and EOM are normal. Pupils are equal, round, and reactive to light.  Neck: Normal range of motion. Neck supple.  Respiratory: Effort normal and breath sounds normal.  GI: Soft.  Neurological: He appears lethargic. A cranial nerve deficit is present.  Dysarthric, speech is very difficult to understand Proprioception intact in the lower extremities Due to patient lethargy difficult to perform detailed exam Unable to lift arms off of bed. With gravity excluded, unable to fully flex left elbow. Is able to do so on the right. Strength is 2/5 biceps. Deltoid is 1/5, triceps 2/5 Grip 2/3 bilaterally, 2/5 intrinsics Lower extremity strength is at least 4+ to 5- throughout. Mental status clouds all findings.    Assessment/Plan: Possible or for decompression of the spinal canal. Placement of cervical collar. Will need CT cspine.   Charles Bridges L 04/14/2015, 11:49 AM

## 2015-04-14 NOTE — Progress Notes (Signed)
PT Cancellation Note  Patient Details Name: Charles Bridges MRN: [id number] DOB: 02-01-1936   Cancelled Treatment:    Reason Eval/Treat Not Completed: Pain limiting ability to participate (per RN pt cannot tolerate movement 2* pain and will likely have surgery today. Will follow. )   Tamala Ser 04/14/2015, 9:57 AM [phone]

## 2015-04-14 NOTE — Progress Notes (Signed)
[facility] PROGRESS NOTE  Charles Bridges AVW:[id number] DOB: 01-25-1936 DOA: 04/13/2015 PCP: Ailene Ravel, MD  Assessment/Plan: 1. Acute C4-5 fracture 1. MRI brain with old basal ganglia and B thalamic infarcts 2. Cervical MR with acute C4-5 fracture with cord edema from c3 through c5-6 and severe canal stenosis  3. Today unable to move L arm off bed (was able to reach across body yesterday) 4. Consulted Neurosurgery 5. Aspen neck brace ordered 2. HTN 1. BP improved but still suboptimally controlled 2. ?secondary to pain. Cont analgesics 3. Had started pt on metoprolol given tachycardia 4. Will resume ACEI, albeit lower dose 5. Added PRN hydralazine 3. HLD 1. Cont on statin 4. Rib fracture 1. Analgesics as tolerated 5. Hx CVA 1. Seems stable 2. MRI brain w/ findings of old thalamic infarcts. No acute process 6. ETOH abuse 1. ETOH level of 169 at OSH 2. Will keep on CIWA 7. Hyponatremia 1. Possibly secondary to ETOH 2. Will serum osm unremarkable. Awaiting urine OSM as well as urine lytes in order to calculate FENa 3. Holding HCTZ for now 8. Possible PNA 1. Noted on OSH CXR 2. Given ETOH abuse hx, consideration for possible aspiration. Had started empiric vanc and zosyn and will continue 3. Afebrile with no leukocytosis 9. DVT prophylaxis 1. On coumadin, held in anticipation for surgery  Code Status: Full Family Communication: Pt in room, family at bedside  Disposition Plan: Pending   Consultants:  Neurosurgery  Procedures:    Antibiotics:  Vancomycin 9/18>>>\  Zosyn 9/18>>>   HPI/Subjective: States unable to move L arm today  Objective: Filed Vitals:   04/14/15 1044 04/14/15 1054 04/14/15 1209 04/14/15 1512  BP: 163/77  163/71 158/71  Pulse: 85 84 85 83  Temp: 98.6 F (37 C)   98.7 F (37.1 C)  TempSrc: Oral     Resp:    22  Height:      Weight:      SpO2: 75% 100% 85% 96%    Intake/Output Summary (Last 24 hours) at 04/14/15  1839 Last data filed at 04/14/15 1519  Gross per 24 hour  Intake 1121.25 ml  Output    300 ml  Net 821.25 ml   Filed Weights   04/13/15 1630  Weight: 103.148 kg (227 lb 6.4 oz)    Exam:   General:  Awake, neck brace in place, appears in pain  Cardiovascular: regular, mildly tachycardic, s1, s2  Respiratory: no wheezing, decreased inspiratory effort  Abdomen: soft, obese, pos BS  Musculoskeletal: perfused, no clubbing   Data Reviewed: Basic Metabolic Panel:  Recent Labs Lab 04/13/15 1842  CREATININE 1.04   Liver Function Tests: No results for input(s): AST, ALT, ALKPHOS, BILITOT, PROT, ALBUMIN in the last 168 hours. No results for input(s): LIPASE, AMYLASE in the last 168 hours. No results for input(s): AMMONIA in the last 168 hours. CBC:  Recent Labs Lab 04/13/15 1842  WBC 9.7  HGB 12.8*  HCT 35.4*  MCV 92.2  PLT 160   Cardiac Enzymes: No results for input(s): CKTOTAL, CKMB, CKMBINDEX, TROPONINI in the last 168 hours. BNP (last 3 results) No results for input(s): BNP in the last 8760 hours.  ProBNP (last 3 results) No results for input(s): PROBNP in the last 8760 hours.  CBG:  Recent Labs Lab 04/14/15 0435  GLUCAP 153*    No results found for this or any previous visit (from the past 240 hour(s)).   Studies: [name] Contrast  04/14/2015   CLINICAL  DATA:  Recent fall, worsening of RIGHT arm paresthesias and weakness. History of carotid artery occlusion, hypertension, diabetes, stroke.  EXAM: MRI HEAD WITHOUT CONTRAST  TECHNIQUE: Multiplanar, multiecho pulse sequences of the brain and surrounding structures were obtained without intravenous contrast.  COMPARISON:  None.  FINDINGS: Multiple sequences are mild to moderately motion degraded.  No reduced diffusion to suggest acute ischemia. No susceptibility artifact to suggest hemorrhage. Moderate to severe ventriculomegaly with proportional enlargement of cerebral sulci and cerebellar folia. Old  RIGHT basal ganglia and bilateral thalamus lacunar infarcts. No midline shift, mass effect or mass lesions. RIGHT cerebral peduncle volume loss compatible with wallerian degeneration. A few scattered subcentimeter supratentorial white matter T2 hyperintensities, and T2 hyperintense signal within the pons consistent with mild chronic small vessel ischemic disease. No midline shift, mass effect or mass lesions.  No abnormal extra-axial fluid collections. Normal major intracranial vascular flow voids seen at the skull base.  Status post bilateral ocular lens implants. Moderate paranasal sinus mucosal thickening and probably inspissated mucus RIGHT maxillary sinus. The mastoid air cells are well aerated. No abnormal sellar expansion. No cerebellar tonsillar ectopia. No suspicious calvarial bone marrow signal. Patient is edentulous.  IMPRESSION: No acute intracranial process on this motion degraded examination.  Old RIGHT basal ganglia and bilateral thalamus infarcts.  Moderate to severe global brain atrophy. Mild chronic small vessel ischemic disease.   Electronically Signed   By: Awilda Metro M.D.   On: 04/14/2015 01:44   Mr Cervical Spine Wo Contrast  04/14/2015   ADDENDUM REPORT: 04/14/2015 11:02 ADDENDUM: Original report by Dr. Karie Kirks. Addendum by Dr. Mosetta Putt: Abnormal T2 hyperintensity within the spinal cord is most consistent with edema, with myelomalacia a less likely consideration. Study discussed in person with Dr. Franky Macho on 04/14/2015 at 10 a.m. Electronically Signed   By: Sebastian Ache M.D.   On: 04/14/2015 11:02  04/14/2015   CLINICAL DATA:  Worsening RIGHT arm paresthesias after recent fall. History of carotid artery occlusion, hypertension, diabetes and stroke.  EXAM: MRI CERVICAL SPINE WITHOUT CONTRAST  TECHNIQUE: Multiplanar, multisequence MR imaging of the cervical spine was performed. No intravenous contrast was administered.  COMPARISON:  None.  FINDINGS: Motion degraded examination, per  technologist note, patient had difficulty remaining still even with sedation. Axial T2 and T2 gradient sequences nearly nondiagnostic, severely degraded sagittal STIR.  Cervical vertebral bodies and posterior elements appear intact and aligned with maintenance of cervical lordosis. However, there is mild widening of the C4-5 disc space ventrally, associated with bright STIR signal concerning for fracture, likely throughout osteophyte. Severe C5-6 disc height loss. Bridging bone marrow signal C6-7 and T1-2 consistent with auto interbody arthrodesis. No malalignment. Maintenance of cervical lordosis. Moderate to severe chronic discogenic endplate changes C4-5, C5-6. Disc desiccation C2-3 through C5-6 and, C7-T1.  Bright T2 signal within the spinal cord from C3 - C5-6, most consistent with cord edema or pre syrinx without frank syrinx. Craniocervical junction intact. Probable disruption of the anterior longitudinal ligament which is likely calcified at C4-5. Small apparent retropharyngeal versus prevertebral effusion, difficult to evaluate due to patient motion.  Severely limited axial gradient sequences. 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 Superimposed degenerative change resulting in severe canal stenosis C3-4 thru C5-6, corroborated by sagittal imaging, AP dimension the canal is approximately 6 mm. Probable neural foraminal narrowing midcervical spine, limited assessment.  IMPRESSION: Motion degraded examination, nearly nondiagnostic axial sequences.  Acute fracture through the C4-5 anterior disc space which is likely calcified, including suspected  focal disruption of the anterior longitudinal ligament, concerning for hyperextension injury. Probable prevertebral versus retropharyngeal effusion.  Spinal cord edema/pre syrinx C3 through C5-6 without frank syrinx.  Degenerative change of the cervical spine superimposed on congenital canal stenosis. Severe canal stenosis C3-4  through C5-6. Probable neural foraminal narrowing.  Recommend CT cervical spine to assess bony changes.  Electronically Signed: By: Awilda Metro M.D. On: 04/14/2015 02:33    Scheduled Meds: . allopurinol  300 mg Oral Daily  . folic acid  1 mg Oral Daily  . [START ON 04/15/2015] Influenza vac split quadrivalent PF  0.5 mL Intramuscular Tomorrow-1000  . [START ON 04/15/2015] lisinopril  5 mg Oral Daily  . LORazepam  0-4 mg Intravenous 4 times per day   Followed by  . [START ON 04/15/2015] LORazepam  0-4 mg Intravenous Q12H  . metoprolol tartrate  50 mg Oral BID  . multivitamin with minerals  1 tablet Oral Daily  . piperacillin-tazobactam (ZOSYN)  IV  3.375 g Intravenous 3 times per day  . pravastatin  40 mg Oral q1800  . sodium chloride  3 mL Intravenous Q12H  . thiamine  100 mg Oral Daily   Or  . thiamine  100 mg Intravenous Daily  . vancomycin  1,000 mg Intravenous Q12H   Continuous Infusions: . sodium chloride 75 mL/hr at 04/14/15 0235    Principal Problem:   Sensory loss Active Problems:   Essential hypertension   HLD (hyperlipidemia)   Rib fracture   History of CVA (cerebrovascular accident)   Falls   Alcohol abuse   Hyponatremia   TIA (transient ischemic attack)    CHIU, STEPHEN K  [facility] Pager [phone]. If 7PM-7AM, please contact night-coverage at [email or web address], password [facility] 04/14/2015, 6:39 PM  LOS: 1 day

## 2015-04-15 ENCOUNTER — Inpatient Hospital Stay (HOSPITAL_COMMUNITY): Payer: Medicare Other | Admitting: Certified Registered Nurse Anesthetist

## 2015-04-15 ENCOUNTER — Inpatient Hospital Stay (HOSPITAL_COMMUNITY): Payer: Medicare Other

## 2015-04-15 ENCOUNTER — Encounter (HOSPITAL_COMMUNITY)
Admission: AD | Disposition: A | Discharge status: Skilled Nursing Facility | Payer: Self-pay | Source: Other Acute Inpatient Hospital | Attending: Internal Medicine

## 2015-04-15 ENCOUNTER — Other Ambulatory Visit: Payer: Self-pay | Admitting: Neurosurgery

## 2015-04-15 DIAGNOSIS — M4802 Spinal stenosis, cervical region: Secondary | ICD-10-CM | POA: Diagnosis not present

## 2015-04-15 DIAGNOSIS — S14109A Unspecified injury at unspecified level of cervical spinal cord, initial encounter: Secondary | ICD-10-CM | POA: Diagnosis present

## 2015-04-15 HISTORY — PX: RADIOLOGY WITH ANESTHESIA: SHX6223

## 2015-04-15 HISTORY — PX: POSTERIOR CERVICAL FUSION/FORAMINOTOMY: SHX5038

## 2015-04-15 LAB — CBC
HCT: 30.1 % — ABNORMAL LOW (ref 39.0–52.0)
Hemoglobin: 11 g/dL — ABNORMAL LOW (ref 13.0–17.0)
MCH: 34.1 pg — ABNORMAL HIGH (ref 26.0–34.0)
MCHC: 36.5 g/dL — AB (ref 30.0–36.0)
MCV: 93.2 fL (ref 78.0–100.0)
Platelets: 140 10*3/uL — ABNORMAL LOW (ref 150–400)
RBC: 3.23 MIL/uL — AB (ref 4.22–5.81)
RDW: 13.6 % (ref 11.5–15.5)
WBC: 7.9 10*3/uL (ref 4.0–10.5)

## 2015-04-15 LAB — BASIC METABOLIC PANEL
ANION GAP: 8 (ref 5–15)
BUN: 19 mg/dL (ref 6–20)
CALCIUM: 8.4 mg/dL — AB (ref 8.9–10.3)
CO2: 21 mmol/L — ABNORMAL LOW (ref 22–32)
Chloride: 100 mmol/L — ABNORMAL LOW (ref 101–111)
Creatinine, Ser: 1.12 mg/dL (ref 0.61–1.24)
Glucose, Bld: 135 mg/dL — ABNORMAL HIGH (ref 65–99)
Potassium: 3.9 mmol/L (ref 3.5–5.1)
SODIUM: 129 mmol/L — AB (ref 135–145)

## 2015-04-15 LAB — GLUCOSE, CAPILLARY
Glucose-Capillary: 136 mg/dL — ABNORMAL HIGH (ref 65–99)
Glucose-Capillary: 154 mg/dL — ABNORMAL HIGH (ref 65–99)

## 2015-04-15 LAB — PROTIME-INR
INR: 1.47 (ref 0.00–1.49)
PROTHROMBIN TIME: 17.9 s — AB (ref 11.6–15.2)

## 2015-04-15 LAB — TYPE AND SCREEN
ABO/RH(D): A POS
ANTIBODY SCREEN: NEGATIVE

## 2015-04-15 LAB — ABO/RH: ABO/RH(D): A POS

## 2015-04-15 LAB — SURGICAL PCR SCREEN
MRSA, PCR: NEGATIVE
STAPHYLOCOCCUS AUREUS: POSITIVE — AB

## 2015-04-15 SURGERY — POSTERIOR CERVICAL FUSION/FORAMINOTOMY LEVEL 3
Anesthesia: General | Site: Spine Cervical

## 2015-04-15 SURGERY — RADIOLOGY WITH ANESTHESIA
Anesthesia: General

## 2015-04-15 MED ORDER — THROMBIN 20000 UNITS EX SOLR
CUTANEOUS | Status: DC | PRN
  Administered 2015-04-15: 21:00:00 via TOPICAL

## 2015-04-15 MED ORDER — LISINOPRIL 10 MG PO TABS: 10.0000 mg | ORAL_TABLET | Freq: Every day | ORAL | Status: DC

## 2015-04-15 MED ORDER — PROPOFOL INFUSION 10 MG/ML OPTIME
INTRAVENOUS | Status: DC | PRN
  Administered 2015-04-15: 25 ug/kg/min via INTRAVENOUS

## 2015-04-15 MED ORDER — BACITRACIN ZINC 500 UNIT/GM EX OINT
TOPICAL_OINTMENT | CUTANEOUS | Status: DC | PRN
  Administered 2015-04-15: 1 via TOPICAL

## 2015-04-15 MED ORDER — LISINOPRIL-HYDROCHLOROTHIAZIDE 20-12.5 MG PO TABS: 1.0000 | ORAL_TABLET | Freq: Every day | ORAL | Status: DC

## 2015-04-15 MED ORDER — LATANOPROST 0.005 % OP SOLN
1.0000 [drp] | Freq: Every day | OPHTHALMIC | Status: DC
  Administered 2015-04-16 – 2015-04-21 (×7): 1 [drp] via OPHTHALMIC
  Filled 2015-04-15 (×2): qty 2.5

## 2015-04-15 MED ORDER — LISINOPRIL 20 MG PO TABS
20.0000 mg | ORAL_TABLET | Freq: Every day | ORAL | Status: DC
  Administered 2015-04-16 – 2015-04-22 (×7): 20 mg via ORAL
  Filled 2015-04-15 (×7): qty 1

## 2015-04-15 MED ORDER — ROCURONIUM BROMIDE 100 MG/10ML IV SOLN
INTRAVENOUS | Status: DC | PRN
  Administered 2015-04-15 (×3): 50 mg via INTRAVENOUS

## 2015-04-15 MED ORDER — FENTANYL CITRATE (PF) 100 MCG/2ML IJ SOLN
INTRAMUSCULAR | Status: DC | PRN
  Administered 2015-04-15: 150 ug via INTRAVENOUS

## 2015-04-15 MED ORDER — BISACODYL 10 MG RE SUPP: 10.0000 mg | Freq: Every day | RECTAL | Status: DC | PRN

## 2015-04-15 MED ORDER — CHLORHEXIDINE GLUCONATE CLOTH 2 % EX PADS
6.0000 | MEDICATED_PAD | Freq: Every day | CUTANEOUS | Status: DC
  Administered 2015-04-15: 6 via TOPICAL

## 2015-04-15 MED ORDER — PHENOL 1.4 % MT LIQD
1.0000 | OROMUCOSAL | Status: DC | PRN
Start: 2015-04-15 — End: 2015-04-22

## 2015-04-15 MED ORDER — MAGNESIUM CITRATE PO SOLN
1.0000 | Freq: Once | ORAL | Status: DC | PRN
  Filled 2015-04-15: qty 296

## 2015-04-15 MED ORDER — FENTANYL CITRATE (PF) 250 MCG/5ML IJ SOLN
INTRAMUSCULAR | Status: AC
  Filled 2015-04-15: qty 5

## 2015-04-15 MED ORDER — EPHEDRINE SULFATE 50 MG/ML IJ SOLN
INTRAMUSCULAR | Status: DC | PRN
  Administered 2015-04-15: 10 mg via INTRAVENOUS

## 2015-04-15 MED ORDER — LIDOCAINE HCL (CARDIAC) 20 MG/ML IV SOLN
INTRAVENOUS | Status: DC | PRN
  Administered 2015-04-15: 60 mg via INTRAVENOUS

## 2015-04-15 MED ORDER — SODIUM CHLORIDE 0.9 % IV SOLN
INTRAVENOUS | Status: DC | PRN
  Administered 2015-04-15: 20 mL via INTRAMUSCULAR

## 2015-04-15 MED ORDER — BACITRACIN ZINC 500 UNIT/GM EX OINT
TOPICAL_OINTMENT | CUTANEOUS | Status: AC
  Filled 2015-04-15: qty 28.35

## 2015-04-15 MED ORDER — 0.9 % SODIUM CHLORIDE (POUR BTL) OPTIME
TOPICAL | Status: DC | PRN
  Administered 2015-04-15: 1000 mL

## 2015-04-15 MED ORDER — DEXAMETHASONE SODIUM PHOSPHATE 10 MG/ML IJ SOLN
INTRAMUSCULAR | Status: DC | PRN
  Administered 2015-04-15: 10 mg via INTRAVENOUS

## 2015-04-15 MED ORDER — PROMETHAZINE HCL 25 MG/ML IJ SOLN
6.2500 mg | INTRAMUSCULAR | Status: DC | PRN
Start: 2015-04-15 — End: 2015-04-15

## 2015-04-15 MED ORDER — ACETAMINOPHEN 325 MG PO TABS
650.0000 mg | ORAL_TABLET | ORAL | Status: DC | PRN
  Administered 2015-04-20: 650 mg via ORAL
  Filled 2015-04-15: qty 2

## 2015-04-15 MED ORDER — HYDROCHLOROTHIAZIDE 12.5 MG PO CAPS
12.5000 mg | ORAL_CAPSULE | Freq: Every day | ORAL | Status: DC
  Administered 2015-04-16 – 2015-04-22 (×7): 12.5 mg via ORAL
  Filled 2015-04-15 (×7): qty 1

## 2015-04-15 MED ORDER — HYDROMORPHONE HCL 1 MG/ML IJ SOLN
0.5000 mg | INTRAMUSCULAR | Status: DC | PRN
Start: 2015-04-15 — End: 2015-04-19
  Administered 2015-04-16 (×2): 1 mg via INTRAVENOUS
  Administered 2015-04-16: 0.5 mg via INTRAVENOUS
  Administered 2015-04-16 – 2015-04-18 (×5): 1 mg via INTRAVENOUS
  Filled 2015-04-15 (×8): qty 1

## 2015-04-15 MED ORDER — MENTHOL 3 MG MT LOZG: 1.0000 | LOZENGE | OROMUCOSAL | Status: DC | PRN

## 2015-04-15 MED ORDER — POTASSIUM CHLORIDE IN NACL 20-0.9 MEQ/L-% IV SOLN
INTRAVENOUS | Status: DC
Start: 2015-04-15 — End: 2015-04-19
  Administered 2015-04-16 – 2015-04-19 (×6): via INTRAVENOUS
  Filled 2015-04-15 (×7): qty 1000

## 2015-04-15 MED ORDER — INSULIN ASPART 100 UNIT/ML ~~LOC~~ SOLN
0.0000 [IU] | SUBCUTANEOUS | Status: DC
  Administered 2015-04-15 – 2015-04-16 (×6): 2 [IU] via SUBCUTANEOUS

## 2015-04-15 MED ORDER — PANTOPRAZOLE SODIUM 40 MG IV SOLR
40.0000 mg | Freq: Every day | INTRAVENOUS | Status: DC
  Administered 2015-04-16 – 2015-04-19 (×4): 40 mg via INTRAVENOUS
  Filled 2015-04-15 (×4): qty 40

## 2015-04-15 MED ORDER — SENNOSIDES-DOCUSATE SODIUM 8.6-50 MG PO TABS: 1.0000 | ORAL_TABLET | Freq: Every evening | ORAL | Status: DC | PRN

## 2015-04-15 MED ORDER — ONDANSETRON HCL 4 MG/2ML IJ SOLN: 4.0000 mg | INTRAMUSCULAR | Status: DC | PRN

## 2015-04-15 MED ORDER — SENNA 8.6 MG PO TABS
1.0000 | ORAL_TABLET | Freq: Two times a day (BID) | ORAL | Status: DC
  Administered 2015-04-16 – 2015-04-22 (×11): 8.6 mg via ORAL
  Filled 2015-04-15 (×11): qty 1

## 2015-04-15 MED ORDER — PHENYLEPHRINE HCL 10 MG/ML IJ SOLN
10.0000 mg | INTRAVENOUS | Status: DC | PRN
  Administered 2015-04-15: 35 ug/min via INTRAVENOUS

## 2015-04-15 MED ORDER — CHLORHEXIDINE GLUCONATE 0.12% ORAL RINSE (MEDLINE KIT)
15.0000 mL | Freq: Two times a day (BID) | OROMUCOSAL | Status: DC
  Administered 2015-04-15 – 2015-04-17 (×3): 15 mL via OROMUCOSAL

## 2015-04-15 MED ORDER — DEXAMETHASONE SODIUM PHOSPHATE 10 MG/ML IJ SOLN
INTRAMUSCULAR | Status: AC
  Filled 2015-04-15: qty 1

## 2015-04-15 MED ORDER — SODIUM CHLORIDE 0.9 % IJ SOLN: 3.0000 mL | Freq: Two times a day (BID) | INTRAMUSCULAR | Status: DC

## 2015-04-15 MED ORDER — ACETAMINOPHEN 650 MG RE SUPP: 650.0000 mg | RECTAL | Status: DC | PRN

## 2015-04-15 MED ORDER — HYDROCODONE-ACETAMINOPHEN 5-325 MG PO TABS
1.0000 | ORAL_TABLET | ORAL | Status: DC | PRN
  Administered 2015-04-16 – 2015-04-19 (×7): 2 via ORAL
  Filled 2015-04-15 (×8): qty 2

## 2015-04-15 MED ORDER — ANTISEPTIC ORAL RINSE SOLUTION (CORINZ)
7.0000 mL | Freq: Four times a day (QID) | OROMUCOSAL | Status: DC
  Administered 2015-04-16 – 2015-04-17 (×6): 7 mL via OROMUCOSAL

## 2015-04-15 MED ORDER — PROPOFOL 10 MG/ML IV BOLUS
INTRAVENOUS | Status: DC | PRN
  Administered 2015-04-15: 110 mg via INTRAVENOUS

## 2015-04-15 MED ORDER — MUPIROCIN 2 % EX OINT
1.0000 "application " | TOPICAL_OINTMENT | Freq: Two times a day (BID) | CUTANEOUS | Status: DC
  Administered 2015-04-15: 1 via NASAL
  Filled 2015-04-15: qty 22

## 2015-04-15 MED ORDER — OXYCODONE-ACETAMINOPHEN 5-325 MG PO TABS: 1.0000 | ORAL_TABLET | ORAL | Status: DC | PRN

## 2015-04-15 MED ORDER — PROPOFOL 1000 MG/100ML IV EMUL
0.0000 ug/kg/min | INTRAVENOUS | Status: DC
  Administered 2015-04-15: 25 ug/kg/min via INTRAVENOUS
  Administered 2015-04-16: 20 ug/kg/min via INTRAVENOUS
  Filled 2015-04-15: qty 100

## 2015-04-15 MED ORDER — SODIUM CHLORIDE 0.9 % IV SOLN: 250.0000 mL | INTRAVENOUS | Status: DC

## 2015-04-15 MED ORDER — LACTATED RINGERS IV SOLN
INTRAVENOUS | Status: DC | PRN
  Administered 2015-04-15 (×2): via INTRAVENOUS

## 2015-04-15 MED ORDER — SODIUM CHLORIDE 0.9 % IJ SOLN: 3.0000 mL | INTRAMUSCULAR | Status: DC | PRN

## 2015-04-15 SURGICAL SUPPLY — 59 items
APL SKNCLS STERI-STRIP NONHPOA (GAUZE/BANDAGES/DRESSINGS) ×2
BAG DECANTER FOR FLEXI CONT (MISCELLANEOUS) ×1 IMPLANT
BENZOIN TINCTURE PRP APPL 2/3 (GAUZE/BANDAGES/DRESSINGS) ×6 IMPLANT
BIT DRILL 2.4X (BIT) IMPLANT
BIT DRL 2.4X (BIT) ×1
BLADE SURG ROTATE 9660 (MISCELLANEOUS) ×3 IMPLANT
BLADE ULTRA TIP 2M (BLADE) IMPLANT
BUR MATCHSTICK NEURO 3.0 LAGG (BURR) ×3 IMPLANT
CANISTER SUCT 3000ML PPV (MISCELLANEOUS) ×3 IMPLANT
CLOSURE WOUND 1/2 X4 (GAUZE/BANDAGES/DRESSINGS)
CONT SPEC 4OZ CLIKSEAL STRL BL (MISCELLANEOUS) ×3 IMPLANT
DECANTER SPIKE VIAL GLASS SM (MISCELLANEOUS) ×1 IMPLANT
DRAPE C-ARM 42X72 X-RAY (DRAPES) ×6 IMPLANT
DRAPE LAPAROTOMY 100X72 PEDS (DRAPES) ×3 IMPLANT
DRAPE POUCH INSTRU U-SHP 10X18 (DRAPES) ×3 IMPLANT
DRESSING TELFA 8X10 (GAUZE/BANDAGES/DRESSINGS) ×2 IMPLANT
DRILL BIT (BIT) ×3
DRSG OPSITE POSTOP 4X8 (GAUZE/BANDAGES/DRESSINGS) ×2 IMPLANT
DRSG TELFA 3X8 NADH (GAUZE/BANDAGES/DRESSINGS) IMPLANT
DURAPREP 6ML APPLICATOR 50/CS (WOUND CARE) ×3 IMPLANT
ELECT REM PT RETURN 9FT ADLT (ELECTROSURGICAL) ×3
ELECTRODE REM PT RTRN 9FT ADLT (ELECTROSURGICAL) ×1 IMPLANT
GAUZE SPONGE 4X4 12PLY STRL (GAUZE/BANDAGES/DRESSINGS) IMPLANT
GAUZE SPONGE 4X4 16PLY XRAY LF (GAUZE/BANDAGES/DRESSINGS) IMPLANT
GOWN STRL REUS W/ TWL LRG LVL3 (GOWN DISPOSABLE) IMPLANT
GOWN STRL REUS W/ TWL XL LVL3 (GOWN DISPOSABLE) ×1 IMPLANT
GOWN STRL REUS W/TWL 2XL LVL3 (GOWN DISPOSABLE) IMPLANT
GOWN STRL REUS W/TWL LRG LVL3 (GOWN DISPOSABLE) ×3
GOWN STRL REUS W/TWL XL LVL3 (GOWN DISPOSABLE) ×6
GRAFT BN 5X1XSPNE CVD POST DBM (Bone Implant) IMPLANT
GRAFT BONE MAGNIFUSE 1X5CM (Bone Implant) ×3 IMPLANT
KIT BASIN OR (CUSTOM PROCEDURE TRAY) ×3 IMPLANT
KIT ROOM TURNOVER OR (KITS) ×3 IMPLANT
MEDTRONIC 2.4MM DRILL BIT ×1 IMPLANT
NDL HYPO 25X1 1.5 SAFETY (NEEDLE) ×1 IMPLANT
NEEDLE HYPO 25X1 1.5 SAFETY (NEEDLE) ×3 IMPLANT
NS IRRIG 1000ML POUR BTL (IV SOLUTION) ×3 IMPLANT
PACK LAMINECTOMY NEURO (CUSTOM PROCEDURE TRAY) ×3 IMPLANT
PAD ARMBOARD 7.5X6 YLW CONV (MISCELLANEOUS) ×9 IMPLANT
PAD DRESSING TELFA 3X8 NADH (GAUZE/BANDAGES/DRESSINGS) IMPLANT
PIN MAYFIELD SKULL DISP (PIN) ×3 IMPLANT
ROD VERTEX 50MM (Rod) ×4 IMPLANT
SCREW 3.5X12 (Screw) ×24 IMPLANT
SCREW BN 12X3.5XMA SPNE (Screw) IMPLANT
SCREW SET M6 (Screw) ×16 IMPLANT
SPONGE LAP 4X18 X RAY DECT (DISPOSABLE) IMPLANT
SPONGE SURGIFOAM ABS GEL 100 (HEMOSTASIS) ×3 IMPLANT
STAPLER SKIN PROX WIDE 3.9 (STAPLE) ×1 IMPLANT
STRIP CLOSURE SKIN 1/2X4 (GAUZE/BANDAGES/DRESSINGS) IMPLANT
SUT ETHILON 3 0 FSL (SUTURE) ×2 IMPLANT
SUT VIC AB 0 CT1 18XCR BRD8 (SUTURE) ×1 IMPLANT
SUT VIC AB 0 CT1 8-18 (SUTURE) ×3
SUT VIC AB 2-0 CT1 18 (SUTURE) ×3 IMPLANT
SUT VIC AB 3-0 SH 8-18 (SUTURE) ×3 IMPLANT
SYR 20ML ECCENTRIC (SYRINGE) ×3 IMPLANT
TOWEL OR 17X24 6PK STRL BLUE (TOWEL DISPOSABLE) ×3 IMPLANT
TOWEL OR 17X26 10 PK STRL BLUE (TOWEL DISPOSABLE) ×3 IMPLANT
UNDERPAD 30X30 INCONTINENT (UNDERPADS AND DIAPERS) ×1 IMPLANT
WATER STERILE IRR 1000ML POUR (IV SOLUTION) ×3 IMPLANT

## 2015-04-15 NOTE — Progress Notes (Signed)
PT Cancellation Note  Patient Details Name: AMED DATTA MRN: [id number] DOB: 01-Sep-1935   Cancelled Treatment:    Reason Eval/Treat Not Completed: Patient at procedure or test/unavailable.  Pt not in room. PT to check back later today or tomorrow.  Thanks,    Rollene Rotunda. Shani Fitch, PT, DPT [phone]   04/15/2015, 1:53 PM

## 2015-04-15 NOTE — Progress Notes (Signed)
Patient ID: PAZ WINSETT, male   DOB: 07-03-1936, 79 y.o.   MRN: [id number]  BP 161/77 mmHg  Pulse 89  Temp(Src) 97.1 F (36.2 C) (Oral)  Resp 17  Ht  (1.854 m)  Wt 103.148 kg (227 lb 6.4 oz)  BMI 30.01 kg/m2  SpO2 98%  Mri from today reviewed. Shows stenosis and cord injury. Plan on a posterior decompression with arthrodesis from C3-6. Risks including decreased strength, spinal cord damage, paralysis, infection, bleeding, infection, hardware failure, fusion failure, and other risks were discussed. He understands and wishes to proceed.

## 2015-04-15 NOTE — Progress Notes (Addendum)
[facility] PROGRESS NOTE  Charles Bridges ZOX:[id number] DOB: 10/19/1935 DOA: 04/13/2015 PCP: Ailene Ravel, MD   Off Service Summary: 79yo with hx ETOH abuse who presents as a transfer from [facility] following neck injury after fall from truck. MRI has demonstrated cervical fracture with cord injury. Neurosurgery following with plans for surgery. Patient also hyponatremic, likely related to ETOH abuse.  Assessment/Plan: 1. Acute C4-5 fracture 1. MRI brain revealed old basal ganglia and B thalamic infarcts 2. Cervical MR with acute C4-5 fracture with cord edema from c3 through c5-6 and severe canal stenosis 3. MRI was repeated secondary to motion artifact on initial study. Notable for interval increase in retropharyngeal edema/hemorrhage from C2-C4 4. Neurosurgery following. Plans for decompression surgery possibly today 5. Aspen neck brace continued 2. HTN 1. BP improved but still suboptimally controlled 2. ?secondary to pain. Cont analgesics 3. Had started pt on metoprolol given concurrent tachycardia 4. Have resumed ACEI, albeit at a lower dose.  5. Added PRN hydralazine 6. BP still suboptimal. Will increase lisinopril to 10mg  3. HLD 1. Cont on statin 4. Rib fracture 1. Analgesics as tolerated 5. Hx CVA 1. Seems stable 2. MRI brain w/ findings of old thalamic infarcts. No acute process 6. ETOH abuse 1. ETOH level of 169 at OSH 2. Will keep on CIWA 7. Hyponatremia 1. Possibly secondary to ETOH 2. Will serum osm unremarkable. Still awaiting urine OSM as well as urine lytes in order to calculate FENa 3. Holding HCTZ for now 4. Na has improved to 129 8. Possible PNA 1. Noted on OSH CXR 2. Given ETOH abuse hx, consideration for possible aspiration. Had initially started empiric vanc and zosyn. Remains afebrile. No leukocytosis 3. Afebrile with no leukocytosis 4. Will d/c vanc and cont on zosyn monotherapy for now 9. DVT prophylaxis 1. Was  on coumadin, held in anticipation for surgery  Code Status: Full Family Communication: Pt in room, family at bedside  Disposition Plan: Pending   Consultants:  Neurosurgery  Procedures:    Antibiotics:  Vancomycin 9/18>>>9/20  Zosyn 9/18>>>   HPI/Subjective: Reports still being unable to lift arms this AM  Objective: Filed Vitals:   04/15/15 0022 04/15/15 0531 04/15/15 1405 04/15/15 1425  BP: 146/63 160/62 175/69 170/69  Pulse: 81 89    Temp: 98.4 F (36.9 C) 98.4 F (36.9 C) 97.1 F (36.2 C)   TempSrc: Oral Oral    Resp: 18 20 28 17   Height:      Weight:      SpO2: 95% 95% 98%     Intake/Output Summary (Last 24 hours) at 04/15/15 1512 Last data filed at 04/15/15 1400  Gross per 24 hour  Intake 1507.5 ml  Output    900 ml  Net  607.5 ml   Filed Weights   04/13/15 1630  Weight: 103.148 kg (227 lb 6.4 oz)    Exam:   General:  Awake, laying in bed, neck brace in place, appears in pain  Cardiovascular: regular,  s1, s2  Respiratory: no wheezing, decreased inspiratory effort, no crackles  Abdomen: soft, obese, pos BS  Musculoskeletal: perfused, no clubbing, ecchymotic  Data Reviewed: Basic Metabolic Panel:  Recent Labs Lab 04/13/15 1842 04/14/15 1923 04/15/15 0555  NA  --  129* 129*  Bridges  --  4.1 3.9  CL  --  102 100*  CO2  --  20* 21*  GLUCOSE  --  117* 135*  BUN  --  20 19  CREATININE 1.04 1.02  1.12  CALCIUM  --  8.3* 8.4*   Liver Function Tests: No results for input(s): AST, ALT, ALKPHOS, BILITOT, PROT, ALBUMIN in the last 168 hours. No results for input(s): LIPASE, AMYLASE in the last 168 hours. No results for input(s): AMMONIA in the last 168 hours. CBC:  Recent Labs Lab 04/13/15 1842 04/15/15 0555  WBC 9.7 7.9  HGB 12.8* 11.0*  HCT 35.4* 30.1*  MCV 92.2 93.2  PLT 160 140*   Cardiac Enzymes: No results for input(s): CKTOTAL, CKMB, CKMBINDEX, TROPONINI in the last 168 hours. BNP (last 3 results) No results for input(s):  BNP in the last 8760 hours.  ProBNP (last 3 results) No results for input(s): PROBNP in the last 8760 hours.  CBG:  Recent Labs Lab 04/14/15 0435  GLUCAP 153*    Recent Results (from the past 240 hour(s))  Surgical pcr screen     Status: Abnormal   Collection Time: 04/15/15  7:55 AM  Result Value Ref Range Status   MRSA, PCR NEGATIVE NEGATIVE Final   Staphylococcus aureus POSITIVE (A) NEGATIVE Final    Comment:        The Xpert SA Assay (FDA approved for NASAL specimens in patients over 57 years of age), is one component of a comprehensive surveillance program.  Test performance has been validated by [facility] for patients greater than or equal to 84 year old. It is not intended to diagnose infection nor to guide or monitor treatment.      Studies: Ct Cervical Spine Wo Contrast  04/14/2015   CLINICAL DATA:  79 year old male post trauma. Weakness. Subsequent encounter.  EXAM: CT CERVICAL SPINE WITHOUT CONTRAST  TECHNIQUE: Multidetector CT imaging of the cervical spine was performed without intravenous contrast. Multiplanar CT image reconstructions were also generated.  COMPARISON:  04/14/2015 cervical spine MR.  FINDINGS: Flowing confluent osteophyte C6 through T1 with fusion across the disc spaces.  Prominent anterior osteophyte C5-6 with areas of fusion of the anterior osteophyte.  On the accompanying motion degraded MR, there is prevertebral edema/hemorrhage. Edema extends across the C4-5 anterior longitudinal ligament and disc space consistent with soft tissue injury. Possible mild edema right C4-5 interlaminar ligament. Fracture of the anterior osteophyte at the C4-5 level (although the borders appear fairly well defined) may have recurrent with hyperextension.  Fracture of the anterior C3-4 osteophyte suspected.  With the patient in a cervical collar, there is relatively normal alignment of the cervical spine.  Prominent cervical spondylotic changes with various degrees of  spinal stenosis and foraminal narrowing most prominent C3-4 thru C5-6 where there is cord compression or cord edema extending from C3 through upper C6 on recent MR.  Patient will be scheduled for repeat MR.  IMPRESSION: On the accompanying motion degraded MR, there is prevertebral edema/hemorrhage. Edema extends across the C4-5 anterior longitudinal ligament and disc space consistent with soft tissue injury. Possible mild edema right C4-5 interlaminar ligament. Fracture of the anterior osteophyte at the C4-5 level (although the borders appear fairly well defined) may have recurrent with hyperextension.  Fracture of the anterior C3-4 osteophyte suspected.  Prominent cervical spondylotic changes with various degrees of spinal stenosis and foraminal narrowing most prominent C3-4 thru C5-6 where there is cord compression or cord edema extending from C3 through upper C6 on recent MR.  These results were reviewed with Dr. Franky Macho 04/14/2015  at 8:29 pm.   Electronically Signed   By: Lacy Duverney M.D.   On: 04/14/2015 20:55   Mr Brain Wo Contrast  04/14/2015   CLINICAL DATA:  Recent fall, worsening of RIGHT arm paresthesias and weakness. History of carotid artery occlusion, hypertension, diabetes, stroke.  EXAM: MRI HEAD WITHOUT CONTRAST  TECHNIQUE: Multiplanar, multiecho pulse sequences of the brain and surrounding structures were obtained without intravenous contrast.  COMPARISON:  None.  FINDINGS: Multiple sequences are mild to moderately motion degraded.  No reduced diffusion to suggest acute ischemia. No susceptibility artifact to suggest hemorrhage. Moderate to severe ventriculomegaly with proportional enlargement of cerebral sulci and cerebellar folia. Old RIGHT basal ganglia and bilateral thalamus lacunar infarcts. No midline shift, mass effect or mass lesions. RIGHT cerebral peduncle volume loss compatible with wallerian degeneration. A few scattered subcentimeter supratentorial white matter T2  hyperintensities, and T2 hyperintense signal within the pons consistent with mild chronic small vessel ischemic disease. No midline shift, mass effect or mass lesions.  No abnormal extra-axial fluid collections. Normal major intracranial vascular flow voids seen at the skull base.  Status post bilateral ocular lens implants. Moderate paranasal sinus mucosal thickening and probably inspissated mucus RIGHT maxillary sinus. The mastoid air cells are well aerated. No abnormal sellar expansion. No cerebellar tonsillar ectopia. No suspicious calvarial bone marrow signal. Patient is edentulous.  IMPRESSION: No acute intracranial process on this motion degraded examination.  Old RIGHT basal ganglia and bilateral thalamus infarcts.  Moderate to severe global brain atrophy. Mild chronic small vessel ischemic disease.   Electronically Signed   By: Awilda Metro M.D.   On: 04/14/2015 01:44   Mr Cervical Spine Wo Contrast  04/15/2015   CLINICAL DATA:  79 year old post trauma. Weakness. Subsequent encounter.  EXAM: MRI CERVICAL SPINE WITHOUT CONTRAST  TECHNIQUE: Multiplanar, multisequence MR imaging of the cervical spine was performed. No intravenous contrast was administered.  COMPARISON:  04/14/2015 CT and MR.  FINDINGS: Exam was attempted without sedation and revealed increase in the degree of retropharyngeal edema/ hemorrhage. Axial images remain motion degraded. Preliminary results discussed with Dr. Franky Macho and it was elected to perform examination with anesthesia/moderate sedation.  On repeat imaging with sedation, retropharyngeal edema/hemorrhage is once again noted located anterior to the longus coli muscles extending from the clivus to the upper C5 level and maximal at the C2 through C4 level.  Injury through the C4-5 anterior longitudinal ligament, C4-5 disc space extending to involve the C4-5 facet joints and posterior paraspinal musculature consistent with extension injury. Buckling of the posterior ligaments  which appear edematous. Short pedicles. Broad-based disc osteophyte complex. Facet joint bony overgrowth. Uncinate hypertrophy. Severe C4-5 spinal stenosis and cord compression greater on the left (cord flattening to 3 mm). Marked bilateral foraminal narrowing.  Spur posterior C5 vertebral body greater on left with severe cord compression greater on the left.  C5-6 broad-based disc osteophyte complex with slight cephalad extension. Moderate to marked left-sided and moderate right-sided spinal stenosis. Marked bilateral foraminal narrowing.  Edema within the cord extends from the C3 to the C6 level maximal at the C4 and C5 level. Gradient sequence without focal cord hemorrhage.  C2-3: Shallow broad-based disc osteophyte complex greater to right. Mild narrowing ventral aspect of the thecal sac without cord compression.  C3-4: No edema involving the C3-4 anterior osteophyte to suggest acute fracture as questioned on recent CT. Broad-based disc osteophyte complex greater to left. Moderate spinal stenosis greater on left. Facet joint degenerative changes. Marked left-sided and moderate right-sided foraminal narrowing.  Flowing osteophyte with fusion C6 through T2.  C6-7: Bulge and osteophyte with mild narrowing ventral aspect of the thecal sac greater on  the left. Mild bilateral foraminal narrowing.  C7-T1: Uncinate hypertrophy. Mild to moderate foraminal narrowing bilaterally.  Small amount of fluid within the C1 occipital condyle articulation bilaterally and C1-C2 lateral mass joint bilaterally without surrounding bone edema or soft tissue edema (i.e., separate from the retropharyngeal edema) to suggest significant osseous or ligamentous injury.  IMPRESSION: Interval increase in retropharyngeal edema/hemorrhage maximal at the C2 through C4 level.  Injury through the C4-5 anterior longitudinal ligament, C4-5 disc space extending to involve the C4-5 facet joints and posterior paraspinal musculature consistent with  extension injury. Buckling of the posterior ligaments which appear edematous. Short pedicles. Broad-based disc osteophyte complex. Facet joint bony overgrowth. Uncinate hypertrophy. Severe C4-5 spinal stenosis and cord compression greater on the left (cord flattening to 3 mm). Marked bilateral foraminal narrowing.  Spur posterior C5 vertebral body greater on left with severe cord compression greater on the left.  C5-6 broad-based disc osteophyte complex with slight cephalad extension. Moderate to marked left-sided and moderate right-sided spinal stenosis. Marked bilateral foraminal narrowing.  Edema within the cord extends from the C3 to the C6 level maximal at the C4 and C5 level. Gradient sequence without focal cord hemorrhage.  C2-3 shallow broad-based disc osteophyte complex greater to right. Mild narrowing ventral aspect of the thecal sac without cord compression.  C3-4 broad-based disc osteophyte complex greater to left. Moderate spinal stenosis greater on left. Marked left-sided and moderate right-sided foraminal narrowing. No edema involving the C3-4 anterior osteophyte to suggest acute fracture as questioned on recent CT.  Flowing osteophyte with fusion C6 through T2.  C6-7 bulge and osteophyte with mild narrowing ventral aspect of the thecal sac greater on the left. Mild bilateral foraminal narrowing.  C7-T1 mild to moderate foraminal narrowing bilaterally.  Small amount of fluid within the C1 occipital condyle articulation bilaterally and C1-C2 lateral mass joint bilaterally without surrounding bone edema or soft tissue edema (i.e., separate from the retropharyngeal edema) to suggest significant osseous or ligamentous injury.  These results were called by telephone at the time of interpretation on 04/15/2015 at 1:44 pm to Dr. Coletta Memos , who verbally acknowledged these results.   Electronically Signed   By: Lacy Duverney M.D.   On: 04/15/2015 14:24   Mr Cervical Spine Wo Contrast  04/14/2015    ADDENDUM REPORT: 04/14/2015 11:02 ADDENDUM: Original report by Dr. Karie Kirks. Addendum by Dr. Mosetta Putt: Abnormal T2 hyperintensity within the spinal cord is most consistent with edema, with myelomalacia a less likely consideration. Study discussed in person with Dr. Franky Macho on 04/14/2015 at 10 a.m. Electronically Signed   By: Sebastian Ache M.D.   On: 04/14/2015 11:02  04/14/2015   CLINICAL DATA:  Worsening RIGHT arm paresthesias after recent fall. History of carotid artery occlusion, hypertension, diabetes and stroke.  EXAM: MRI CERVICAL SPINE WITHOUT CONTRAST  TECHNIQUE: Multiplanar, multisequence MR imaging of the cervical spine was performed. No intravenous contrast was administered.  COMPARISON:  None.  FINDINGS: Motion degraded examination, per technologist note, patient had difficulty remaining still even with sedation. Axial T2 and T2 gradient sequences nearly nondiagnostic, severely degraded sagittal STIR.  Cervical vertebral bodies and posterior elements appear intact and aligned with maintenance of cervical lordosis. However, there is mild widening of the C4-5 disc space ventrally, associated with bright STIR signal concerning for fracture, likely throughout osteophyte. Severe C5-6 disc height loss. Bridging bone marrow signal C6-7 and T1-2 consistent with auto interbody arthrodesis. No malalignment. Maintenance of cervical lordosis. Moderate to severe chronic discogenic endplate changes C4-5, C5-6.  Disc desiccation C2-3 through C5-6 and, C7-T1.  Bright T2 signal within the spinal cord from C3 - C5-6, most consistent with cord edema or pre syrinx without frank syrinx. Craniocervical junction intact. Probable disruption of the anterior longitudinal ligament which is likely calcified at C4-5. Small apparent retropharyngeal versus prevertebral effusion, difficult to evaluate due to patient motion.  Severely limited axial gradient sequences. 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 Superimposed degenerative change resulting in severe canal stenosis C3-4 thru C5-6, corroborated by sagittal imaging, AP dimension the canal is approximately 6 mm. Probable neural foraminal narrowing midcervical spine, limited assessment.  IMPRESSION: Motion degraded examination, nearly nondiagnostic axial sequences.  Acute fracture through the C4-5 anterior disc space which is likely calcified, including suspected focal disruption of the anterior longitudinal ligament, concerning for hyperextension injury. Probable prevertebral versus retropharyngeal effusion.  Spinal cord edema/pre syrinx C3 through C5-6 without frank syrinx.  Degenerative change of the cervical spine superimposed on congenital canal stenosis. Severe canal stenosis C3-4 through C5-6. Probable neural foraminal narrowing.  Recommend CT cervical spine to assess bony changes.  Electronically Signed: By: Awilda Metro M.D. On: 04/14/2015 02:33    Scheduled Meds: . allopurinol  300 mg Oral Daily  . Chlorhexidine Gluconate Cloth  6 each Topical Daily  . dexamethasone  4 mg Oral 4 times per day  . folic acid  1 mg Oral Daily  . Influenza vac split quadrivalent PF  0.5 mL Intramuscular Tomorrow-1000  . insulin aspart  0-15 Units Subcutaneous 6 times per day  . lisinopril  5 mg Oral Daily  . LORazepam  0-4 mg Intravenous 4 times per day   Followed by  . LORazepam  0-4 mg Intravenous Q12H  . metoprolol tartrate  50 mg Oral BID  . multivitamin with minerals  1 tablet Oral Daily  . mupirocin ointment  1 application Nasal BID  . piperacillin-tazobactam (ZOSYN)  IV  3.375 g Intravenous 3 times per day  . pravastatin  40 mg Oral q1800  . sodium chloride  3 mL Intravenous Q12H  . thiamine  100 mg Oral Daily   Or  . thiamine  100 mg Intravenous Daily   Continuous Infusions: . sodium chloride 75 mL/hr at 04/14/15 1900    Principal Problem:   Sensory loss Active Problems:   Essential hypertension   HLD (hyperlipidemia)    Rib fracture   History of CVA (cerebrovascular accident)   Falls   Alcohol abuse   Hyponatremia   TIA (transient ischemic attack)    Charles Bridges, Charles Bridges  [facility] Pager [phone]. If 7PM-7AM, please contact night-coverage at [email or web address], password [facility] 04/15/2015, 3:12 PM  LOS: 2 days

## 2015-04-15 NOTE — H&P (Signed)
PULMONARY / CRITICAL CARE MEDICINE   Name: Charles Bridges MRN: [id number] DOB: 1935/08/15    ADMISSION DATE:  04/13/2015 CONSULTATION DATE:  04/15/2015  REFERRING MD :  Rhona Leavens  CHIEF COMPLAINT:  Spinal cord injury s/p fall  INITIAL PRESENTATION: 79 year old male who fell at home presented to OSH initial Cspine CT clear. Developed upper extremity weakness and was transferred to [facility]. CT spine and MRI showed C4-5 fracture with cord edema C3 -6. Taken to OR for decompression. Post op on vent. PCCM to see.   STUDIES:  9/19 CT C-spine > Fracture of the anterior C3-4 osteophyte spinal stenosis and foraminal narrowing most prominent C3-4 thru C5-6 where there is cord compression  9/19 MRI C-spine/Brain > Interval increase in retropharyngeal edema/hemorrhage maximal at the C2 through C4 level. Stenosis and cord injury C3-C6.   SIGNIFICANT EVENTS: 9/18 fall, admit to OSH, initial CT clear. Upper extremity weakness, Tx to [facility] 9/20 to OR for decompression, out on vent.   HISTORY OF PRESENT ILLNESS:  79 year old male with PMH as below, which is significant for CVA (on coumadin), carotid artery occlusion, ETOH abuse, and DM. 9/18 he fell twice while attempting to get into truck. He presented to [facility]. He was noted there to have rib fractures, ETOH level 169 and hyponatremia 127, but initial CT scan of head/Cspine was without apparent injury. While there he developed RUE weakness and was transferred to [facility] for additional neurology evaluation. Upon arrival he went for MRI however study was suboptimal due to motion. CT 9/19 showed spinal stenosis with foraminal narrowing, MRI was repeated which showed cord injury C3-6. 9/20 he was taken to OR under Dr. Franky Macho for decompression. He remained on the ventilator post operatively. PCCM to assist with vent/medical management.   PAST MEDICAL HISTORY :   has a past medical history of Carotid artery occlusion; Hypertension;  Hyperlipidemia; Arthritis; Stroke (1998 and  1999); Ulcer; History of vagotomy; Chronic diarrhea; Fall against object (Oct. 16, 2015); and Diabetes mellitus (2006).  has past surgical history that includes Carotid endarterectomy (03/02/2000); Fracture surgery; and fracture right leg. Prior to Admission medications   Medication Sig Start Date End Date Taking? Authorizing Provider  allopurinol (ZYLOPRIM) 300 MG tablet Take 300 mg by mouth daily as needed (gout).    Yes Historical Provider, MD  latanoprost (XALATAN) 0.005 % ophthalmic solution Place 1 drop into the right eye at bedtime. 04/03/15  Yes Historical Provider, MD  lisinopril-hydrochlorothiazide (PRINZIDE,ZESTORETIC) 20-12.5 MG per tablet Take 1 tablet by mouth daily.   Yes Historical Provider, MD  pravastatin (PRAVACHOL) 40 MG tablet Take 40 mg by mouth daily.   Yes Historical Provider, MD  warfarin (COUMADIN) 5 MG tablet Take 5 mg by mouth daily.   Yes Historical Provider, MD   No Known Allergies  FAMILY HISTORY:  indicated that his mother is deceased. He indicated that his father is deceased.  SOCIAL HISTORY:  reports that he quit smoking about 15 years ago. His smoking use included Cigarettes. He has never used smokeless tobacco. He reports that he drinks alcohol. He reports that he does not use illicit drugs.  REVIEW OF SYSTEMS:  Unable as intubated  SUBJECTIVE:   VITAL SIGNS: Temp:  [97.1 F (36.2 C)-98.4 F (36.9 C)] 98.2 F (36.8 C) (09/20 2223) Pulse Rate:  [69-89] 69 (09/20 2223) Resp:  [15-28] 15 (09/20 2223) BP: (122-175)/(53-77) 122/53 mmHg (09/20 2223) SpO2:  [95 %-98 %] 97 % (09/20 2223) FiO2 (%):  [  50 %] 50 % (09/20 2234) HEMODYNAMICS:   VENTILATOR SETTINGS: Vent Mode:  [-]  FiO2 (%):  [50 %] 50 % Set Rate:  [14 bmp] 14 bmp Vt Set:  [630 mL] 630 mL PEEP:  [5 cmH20] 5 cmH20 Plateau Pressure:  [19 cmH20] 19 cmH20 INTAKE / OUTPUT:  Intake/Output Summary (Last 24 hours) at 04/15/15 2244 Last data filed at  04/15/15 2208  Gross per 24 hour  Intake 2233.75 ml  Output   1550 ml  Net 683.75 ml    PHYSICAL EXAMINATION: General:  Elderly male in NAD on vent Neuro:  Sedated, RASS -2 HEENT:  PERRL, pinpoint Cardiovascular:  RRR, no MRG Lungs:  Clear bilateral breath sounds Abdomen:  Soft, non-distended, Hyperactive Musculoskeletal:  No acute deformity Skin:  Grossly intact  LABS:  CBC  Recent Labs Lab 04/13/15 1842 04/15/15 0555  WBC 9.7 7.9  HGB 12.8* 11.0*  HCT 35.4* 30.1*  PLT 160 140*   Coag's  Recent Labs Lab 04/14/15 0512 04/15/15 0555  INR 1.55* 1.47   BMET  Recent Labs Lab 04/13/15 1842 04/14/15 1923 04/15/15 0555  NA  --  129* 129*  K  --  4.1 3.9  CL  --  102 100*  CO2  --  20* 21*  BUN  --  20 19  CREATININE 1.04 1.02 1.12  GLUCOSE  --  117* 135*   Electrolytes  Recent Labs Lab 04/14/15 1923 04/15/15 0555  CALCIUM 8.3* 8.4*   Sepsis Markers No results for input(s): LATICACIDVEN, PROCALCITON, O2SATVEN in the last 168 hours. ABG No results for input(s): PHART, PCO2ART, PO2ART in the last 168 hours. Liver Enzymes No results for input(s): AST, ALT, ALKPHOS, BILITOT, ALBUMIN in the last 168 hours. Cardiac Enzymes No results for input(s): TROPONINI, PROBNP in the last 168 hours. Glucose  Recent Labs Lab 04/14/15 0435 04/15/15 1522 04/15/15 2234  GLUCAP 153* 136* 154*    Imaging Mr Cervical Spine Wo Contrast  04/15/2015   CLINICAL DATA:  79 year old post trauma. Weakness. Subsequent encounter.  EXAM: MRI CERVICAL SPINE WITHOUT CONTRAST  TECHNIQUE: Multiplanar, multisequence MR imaging of the cervical spine was performed. No intravenous contrast was administered.  COMPARISON:  04/14/2015 CT and MR.  FINDINGS: Exam was attempted without sedation and revealed increase in the degree of retropharyngeal edema/ hemorrhage. Axial images remain motion degraded. Preliminary results discussed with Dr. Franky Macho and it was elected to perform examination  with anesthesia/moderate sedation.  On repeat imaging with sedation, retropharyngeal edema/hemorrhage is once again noted located anterior to the longus coli muscles extending from the clivus to the upper C5 level and maximal at the C2 through C4 level.  Injury through the C4-5 anterior longitudinal ligament, C4-5 disc space extending to involve the C4-5 facet joints and posterior paraspinal musculature consistent with extension injury. Buckling of the posterior ligaments which appear edematous. Short pedicles. Broad-based disc osteophyte complex. Facet joint bony overgrowth. Uncinate hypertrophy. Severe C4-5 spinal stenosis and cord compression greater on the left (cord flattening to 3 mm). Marked bilateral foraminal narrowing.  Spur posterior C5 vertebral body greater on left with severe cord compression greater on the left.  C5-6 broad-based disc osteophyte complex with slight cephalad extension. Moderate to marked left-sided and moderate right-sided spinal stenosis. Marked bilateral foraminal narrowing.  Edema within the cord extends from the C3 to the C6 level maximal at the C4 and C5 level. Gradient sequence without focal cord hemorrhage.  C2-3: Shallow broad-based disc osteophyte complex greater to right. Mild narrowing ventral  aspect of the thecal sac without cord compression.  C3-4: No edema involving the C3-4 anterior osteophyte to suggest acute fracture as questioned on recent CT. Broad-based disc osteophyte complex greater to left. Moderate spinal stenosis greater on left. Facet joint degenerative changes. Marked left-sided and moderate right-sided foraminal narrowing.  Flowing osteophyte with fusion C6 through T2.  C6-7: Bulge and osteophyte with mild narrowing ventral aspect of the thecal sac greater on the left. Mild bilateral foraminal narrowing.  C7-T1: Uncinate hypertrophy. Mild to moderate foraminal narrowing bilaterally.  Small amount of fluid within the C1 occipital condyle articulation  bilaterally and C1-C2 lateral mass joint bilaterally without surrounding bone edema or soft tissue edema (i.e., separate from the retropharyngeal edema) to suggest significant osseous or ligamentous injury.  IMPRESSION: Interval increase in retropharyngeal edema/hemorrhage maximal at the C2 through C4 level.  Injury through the C4-5 anterior longitudinal ligament, C4-5 disc space extending to involve the C4-5 facet joints and posterior paraspinal musculature consistent with extension injury. Buckling of the posterior ligaments which appear edematous. Short pedicles. Broad-based disc osteophyte complex. Facet joint bony overgrowth. Uncinate hypertrophy. Severe C4-5 spinal stenosis and cord compression greater on the left (cord flattening to 3 mm). Marked bilateral foraminal narrowing.  Spur posterior C5 vertebral body greater on left with severe cord compression greater on the left.  C5-6 broad-based disc osteophyte complex with slight cephalad extension. Moderate to marked left-sided and moderate right-sided spinal stenosis. Marked bilateral foraminal narrowing.  Edema within the cord extends from the C3 to the C6 level maximal at the C4 and C5 level. Gradient sequence without focal cord hemorrhage.  C2-3 shallow broad-based disc osteophyte complex greater to right. Mild narrowing ventral aspect of the thecal sac without cord compression.  C3-4 broad-based disc osteophyte complex greater to left. Moderate spinal stenosis greater on left. Marked left-sided and moderate right-sided foraminal narrowing. No edema involving the C3-4 anterior osteophyte to suggest acute fracture as questioned on recent CT.  Flowing osteophyte with fusion C6 through T2.  C6-7 bulge and osteophyte with mild narrowing ventral aspect of the thecal sac greater on the left. Mild bilateral foraminal narrowing.  C7-T1 mild to moderate foraminal narrowing bilaterally.  Small amount of fluid within the C1 occipital condyle articulation bilaterally  and C1-C2 lateral mass joint bilaterally without surrounding bone edema or soft tissue edema (i.e., separate from the retropharyngeal edema) to suggest significant osseous or ligamentous injury.  These results were called by telephone at the time of interpretation on 04/15/2015 at 1:44 pm to Dr. Coletta Memos , who verbally acknowledged these results.   Electronically Signed   By: Lacy Duverney M.D.   On: 04/15/2015 14:24   ASSESSMENT / PLAN:  NEUROLOGIC A:   C4-C5 fracture, Rib fx Spinal cord compression/edema C3-C6 s/p decompression 9/20 Hx CVA ETOH abuse, monitor for withdrawal  P:   RASS goal: -1 Propofol infusion PRN dilaudid for analgesia Neurosurgery following  PULMONARY OETT 9/20 >>> A: Acute respiratory failure in post operative setting ?Aspiration  P:   Full vent support VAP bundle CXR for tube placement ABG  CARDIOVASCULAR A:  HTN HLD  P:  Tele Hold PO lisinopril, metoprolol, statin tonight while intubated, on propofol Ensure adequate analgesia PRN hydralazine  RENAL A:   Hyponatremia, suspect beer potomania.  P:   Repeat Bmet  GASTROINTESTINAL A:   No acute issues  P:   NPO IV protonix for PPI  HEMATOLOGIC A:   Chronic coumadin  P:  Holding anticoagulation due to surgery SCD  INFECTIOUS A:   Concern aspiration PNA  P:   Zosyn 9/19 >  PCT algorithm   ENDOCRINE A:   DM  P:   CBG monitoring and SSI  FAMILY  - Updates:   - Inter-disciplinary family meet or Palliative Care meeting due by:  9/27  Joneen Roach, AGACNP-BC [facility]/Critical Care Pager [phone] or [phone]  04/15/2015 11:26 PM  Attending Note:  79 year old male with C3-C6 compression after a fall resulting in a fracture.  Patient was emergently taken to the OR for decompression.  Patient left on the vent post op with c-collar in place.  Lungs CTA on exam.  Arousable and follows some commands.  Will need to discuss plan for weaning and  extubation with NS.  Maintain NPO for now.  Vent orders placed and ABG noted.  Sedation orders in place as well.  He is chronically on coumadin (due to CVA) and there are reports of etoh abuse unsure if should be continued but will be held for now given recent surgery and that will need to be discussed with NS as well, for now SCD's only for DVT prophylaxis.  Concern for aspiration PNA as well, zosyn started and PCT sent, pending PCT will determine limiting abx as WBC is normal and CXR is not highly suggestive.    The patient is critically ill with multiple organ systems failure and requires high complexity decision making for assessment and support, frequent evaluation and titration of therapies, application of advanced monitoring technologies and extensive interpretation of multiple databases.   Critical Care Time devoted to patient care services described in this note is  35  Minutes. This time reflects time of care of this signee Dr Koren Bound. This critical care time does not reflect procedure time, or teaching time or supervisory time of PA/NP/Med student/Med Resident etc but could involve care discussion time.  Alyson Reedy, M.D. [facility] Pulmonary/Critical Care Medicine. Pager: [phone]. After hours pager: [phone].

## 2015-04-15 NOTE — Anesthesia Procedure Notes (Signed)
Procedure Name: Intubation Date/Time: 04/15/2015 6:59 PM Performed by: Orvilla Fus A Pre-anesthesia Checklist: Patient identified, Timeout performed, Emergency Drugs available, Suction available and Patient being monitored Patient Re-evaluated:Patient Re-evaluated prior to inductionOxygen Delivery Method: Circle system utilized Preoxygenation: Pre-oxygenation with 100% oxygen Intubation Type: IV induction Ventilation: Oral airway inserted - appropriate to patient size and Two handed mask ventilation required Grade View: Grade I Tube type: Subglottic suction tube Tube size: 8.0 mm Number of attempts: 1 Airway Equipment and Method: Rigid stylet and Video-laryngoscopy Placement Confirmation: ETT inserted through vocal cords under direct vision,  breath sounds checked- equal and bilateral and positive ETCO2 Secured at: 23 cm Tube secured with: Tape Dental Injury: Teeth and Oropharynx as per pre-operative assessment  Comments: Intubated with C-collar in place using Glidescope without difficulty

## 2015-04-15 NOTE — Anesthesia Postprocedure Evaluation (Signed)
  Anesthesia Post-op Note  Patient: Charles Bridges  Procedure(s) Performed: Procedure(s) with comments: POSTERIOR CERVICAL FUSION/FORAMINOTOMY LEVEL 3 (N/A) - C3-6 posterior cervical arthrodesis with instrumentation  Patient Location: ICU  Anesthesia Type:General  Level of Consciousness: sedated  Airway and Oxygen Therapy: Patient remains intubated per anesthesia plan  Post-op Pain: none  Post-op Assessment: Post-op Vital signs reviewed, Patient's Cardiovascular Status Stable and Respiratory Function Stable              Post-op Vital Signs: Reviewed and stable  Last Vitals:  Filed Vitals:   04/15/15 2223  BP: 122/53  Pulse: 69  Temp: 36.8 C  Resp: 15    Complications: No apparent anesthesia complications

## 2015-04-15 NOTE — Anesthesia Preprocedure Evaluation (Addendum)
Anesthesia Evaluation  Patient identified by MRN, date of birth, ID band Patient awake    Reviewed: Allergy & Precautions, NPO status , Patient's Chart, lab work & pertinent test results  Airway Mallampati: II  TM Distance: >3 FB Neck ROM: Full    Dental no notable dental hx. (+) Edentulous Lower, Edentulous Upper   Pulmonary former smoker,    Pulmonary exam normal breath sounds clear to auscultation       Cardiovascular hypertension, + Peripheral Vascular Disease  Normal cardiovascular exam Rhythm:Regular Rate:Normal     Neuro/Psych TIACVA negative neurological ROS  negative psych ROS   GI/Hepatic negative GI ROS, Neg liver ROS,   Endo/Other  negative endocrine ROSdiabetes, Type 2obese  Renal/GU negative Renal ROS     Musculoskeletal negative musculoskeletal ROS (+) Arthritis ,   Abdominal   Peds  Hematology negative hematology ROS (+)   Anesthesia Other Findings   Reproductive/Obstetrics negative OB ROS                          Anesthesia Physical Anesthesia Plan  ASA: III  Anesthesia Plan: General   Post-op Pain Management:    Induction: Intravenous  Airway Management Planned: Oral ETT  Additional Equipment:   Intra-op Plan:   Post-operative Plan: Post-operative intubation/ventilation  Informed Consent: I have reviewed the patients History and Physical, chart, labs and discussed the procedure including the risks, benefits and alternatives for the proposed anesthesia with the patient or authorized representative who has indicated his/her understanding and acceptance.   Dental advisory given  Plan Discussed with: CRNA  Anesthesia Plan Comments:        Anesthesia Quick Evaluation                                  Anesthesia Evaluation  Patient identified by MRN, date of birth, ID band Patient awake    Reviewed: Allergy & Precautions, NPO status , Patient's Chart,  lab work & pertinent test results  Airway Mallampati: II  TM Distance: >3 FB Neck ROM: Full    Dental no notable dental hx.    Pulmonary neg pulmonary ROS, former smoker,    Pulmonary exam normal breath sounds clear to auscultation       Cardiovascular hypertension, + Peripheral Vascular Disease  Normal cardiovascular exam Rhythm:Regular Rate:Normal     Neuro/Psych CVA negative psych ROS   GI/Hepatic negative GI ROS, (+)     substance abuse  alcohol use, BAC 0.17 upon admission to outside hospital   Endo/Other  negative endocrine ROSdiabetes  Renal/GU negative Renal ROS  negative genitourinary   Musculoskeletal negative musculoskeletal ROS (+)   Abdominal   Peds negative pediatric ROS (+)  Hematology negative hematology ROS (+)   Anesthesia Other Findings   Reproductive/Obstetrics negative OB ROS                            Anesthesia Physical Anesthesia Plan  ASA: III  Anesthesia Plan: General   Post-op Pain Management:    Induction: Intravenous  Airway Management Planned: LMA  Additional Equipment:   Intra-op Plan:   Post-operative Plan: Extubation in OR  Informed Consent: I have reviewed the patients History and Physical, chart, labs and discussed the procedure including the risks, benefits and alternatives for the proposed anesthesia with the patient or authorized representative who has indicated his/her understanding  and acceptance.   Dental advisory given  Plan Discussed with: CRNA and Surgeon  Anesthesia Plan Comments: (BAC 0.17 upon admission to outside hospital)       Anesthesia Quick Evaluation

## 2015-04-15 NOTE — Transfer of Care (Signed)
Immediate Anesthesia Transfer of Care Note  Patient: Charles Bridges  Procedure(s) Performed: Procedure(s) with comments: POSTERIOR CERVICAL FUSION/FORAMINOTOMY LEVEL 3 (N/A) - C3-6 posterior cervical arthrodesis with instrumentation  Patient Location: PACU  Anesthesia Type:General  Level of Consciousness: Patient remains intubated per anesthesia plan  Airway & Oxygen Therapy: Patient remains intubated per anesthesia plan and Patient placed on Ventilator (see vital sign flow sheet for setting)  Post-op Assessment: Report given to RN and Post -op Vital signs reviewed and stable  Post vital signs: Reviewed and stable  Last Vitals:  Filed Vitals:   04/15/15 1521  BP: 161/77  Pulse:   Temp:   Resp:     Complications: No apparent anesthesia complications

## 2015-04-15 NOTE — Transfer of Care (Signed)
Immediate Anesthesia Transfer of Care Note  Patient: Charles Bridges  Procedure(s) Performed: Procedure(s): MRI CERVICAL SPINE WITHOUT CONTRAST    (RADIOLOGY WITH ANESTHESIA) (N/A)  Patient Location: PACU  Anesthesia Type:General  Level of Consciousness: awake, alert , oriented and patient cooperative  Airway & Oxygen Therapy: Patient Spontanous Breathing and Patient connected to face mask oxygen  Post-op Assessment: Report given to RN and Post -op Vital signs reviewed and stable  Post vital signs: Reviewed and stable  Last Vitals:  Filed Vitals:   04/15/15 1405  BP:   Pulse:   Temp: 36.2 C  Resp:     Complications: No apparent anesthesia complications

## 2015-04-15 NOTE — Op Note (Signed)
04/13/2015 - 04/15/2015  10:25 PM  PATIENT:  Charles Bridges  79 y.o. male  PRE-OPERATIVE DIAGNOSIS:  C4/5 central cord injury, Cervical spinal stenosis  POST-OPERATIVE DIAGNOSIS:  C4/5 central cord injury, Cervical spinal stenosis  PROCEDURE:  Procedure(s):C3,4,5,and C6 laminectomies Posterior arthrodesis C3-6, morselized allograft Posterior instrumentation C3,4,5,6 lateral mass screws(medtronic)   SURGEON: Surgeon(s): Coletta Memos, MD Lisbeth Renshaw, MD  ASSISTANTS:Nundkumar, Marlane Hatcher  ANESTHESIA:   general  EBL:  Total I/O In: 1433.8 [I.V.:1433.8] Out: 950 [Urine:900; Blood:50]  BLOOD ADMINISTERED:none  CELL SAVER GIVEN:none  COUNT:per nursing  DRAINS: none   SPECIMEN:  No Specimen  DICTATION: DONELLE BABA was taken to the operating room, intubated, and placed under a general anesthetic without difficulty. male Once adequate anesthesia was obtained I placed his head in a three pin [place] head holder. We then positioned him prone on the operating room table with all pressure points properly padded. I attached his head to the table with the [facility] adapter. I shaved and prepped his head and neck in a sterile manner. I infiltrated 20cc lidocaine into the planned incision. I opened the skin with a 10 blade. I dissected sharply through the skin and subcutaneous tissue using the monopolar cautery. I exposed the lamina of C2,3,4,5,and 6 bilaterally. I further dissected to expose the facets of C3/4, 4/5, and 5/6 in preparation for the screw placement. I along with Dr. Conchita Paris located the entry points on the C3,4,5,and C6 lateral masses, drilled pilot holes, tapped the holes then placed the screws.  Once the screws were in position we started the spinal canal decompression. Using the drill, Leksell rongeur, and Kerrison punches the lamina of C3,4,5, and a portion of C6 were removed to decompress the canal.  I decorticated the lateral masses, and placed morselized allograft and  local autograft on the bony surfaces to complete the arthrodesis.  When placing the rods to connect the screws, the left C5 screw was in a bad position. We did try to place another screw in line with the others to facilitate rod placement but could not obtain good purchase. Thus I left C5 without a screw on the left. The rods were placed bilaterally through the screw heads and locking caps placed. Good hemostasis was obtained and I then closed the wound. I closed in layers approximating the deep cervical fascia, subcutaneous, and subcuticular planes with vicryl sutures. I approximated the skin with nylon suture. I applied a sterile occlusive dressing. I removed the head holder from the adapter, and we rolled him  onto the bed. I removed the head holder at that time.  PLAN OF CARE: Admit to inpatient   PATIENT DISPOSITION:  PACU - hemodynamically stable.   Delay start of Pharmacological VTE agent (>24hrs) due to surgical blood loss or risk of bleeding:  yes

## 2015-04-15 NOTE — Anesthesia Preprocedure Evaluation (Addendum)
Anesthesia Evaluation  Patient identified by MRN, date of birth, ID band Patient awake    Reviewed: Allergy & Precautions, NPO status , Patient's Chart, lab work & pertinent test results  Airway Mallampati: II  TM Distance: >3 FB Neck ROM: Full    Dental no notable dental hx.    Pulmonary neg pulmonary ROS, former smoker,    Pulmonary exam normal breath sounds clear to auscultation       Cardiovascular hypertension, + Peripheral Vascular Disease  Normal cardiovascular exam Rhythm:Regular Rate:Normal     Neuro/Psych CVA negative psych ROS   GI/Hepatic negative GI ROS, (+)     substance abuse  alcohol use, BAC 0.17 upon admission to outside hospital   Endo/Other  negative endocrine ROSdiabetes  Renal/GU negative Renal ROS  negative genitourinary   Musculoskeletal negative musculoskeletal ROS (+)   Abdominal   Peds negative pediatric ROS (+)  Hematology negative hematology ROS (+)   Anesthesia Other Findings   Reproductive/Obstetrics negative OB ROS                            Anesthesia Physical Anesthesia Plan  ASA: III  Anesthesia Plan: General   Post-op Pain Management:    Induction: Intravenous  Airway Management Planned: LMA  Additional Equipment:   Intra-op Plan:   Post-operative Plan: Extubation in OR  Informed Consent: I have reviewed the patients History and Physical, chart, labs and discussed the procedure including the risks, benefits and alternatives for the proposed anesthesia with the patient or authorized representative who has indicated his/her understanding and acceptance.   Dental advisory given  Plan Discussed with: CRNA and Surgeon  Anesthesia Plan Comments: (BAC 0.17 upon admission to outside hospital)       Anesthesia Quick Evaluation

## 2015-04-15 NOTE — Anesthesia Postprocedure Evaluation (Signed)
  Anesthesia Post-op Note  Patient: Charles Bridges  Procedure(s) Performed: Procedure(s) (LRB): MRI CERVICAL SPINE WITHOUT CONTRAST    (RADIOLOGY WITH ANESTHESIA) (N/A)  Patient Location: PACU  Anesthesia Type: General  Level of Consciousness: awake and alert   Airway and Oxygen Therapy: Patient Spontanous Breathing  Post-op Pain: mild  Post-op Assessment: Post-op Vital signs reviewed, Patient's Cardiovascular Status Stable, Respiratory Function Stable, Patent Airway and No signs of Nausea or vomiting  Last Vitals:  Filed Vitals:   04/15/15 1405  BP:   Pulse:   Temp: 36.2 C  Resp:     Post-op Vital Signs: stable   Complications: No apparent anesthesia complications

## 2015-04-16 ENCOUNTER — Encounter (HOSPITAL_COMMUNITY): Payer: Self-pay | Admitting: Radiology

## 2015-04-16 DIAGNOSIS — J690 Pneumonitis due to inhalation of food and vomit: Secondary | ICD-10-CM | POA: Insufficient documentation

## 2015-04-16 DIAGNOSIS — S14109A Unspecified injury at unspecified level of cervical spinal cord, initial encounter: Secondary | ICD-10-CM

## 2015-04-16 DIAGNOSIS — J9600 Acute respiratory failure, unspecified whether with hypoxia or hypercapnia: Secondary | ICD-10-CM | POA: Insufficient documentation

## 2015-04-16 DIAGNOSIS — F1010 Alcohol abuse, uncomplicated: Secondary | ICD-10-CM

## 2015-04-16 LAB — PROCALCITONIN
Procalcitonin: <0.1 ng/mL
Procalcitonin: <0.1 ng/mL

## 2015-04-16 LAB — BLOOD GAS, ARTERIAL
ACID-BASE DEFICIT: 2.2 mmol/L — AB (ref 0.0–2.0)
Bicarbonate: 21.5 mEq/L (ref 20.0–24.0)
FIO2: 0.5
LHR: 14 {breaths}/min
MECHVT: 630 mL
O2 Saturation: 98.7 %
PEEP: 5 cmH2O
PO2 ART: 138 mmHg — AB (ref 80.0–100.0)
Patient temperature: 98.6
TCO2: 22.5 mmol/L (ref 0–100)
pCO2 arterial: 33.2 mmHg — ABNORMAL LOW (ref 35.0–45.0)
pH, Arterial: 7.428 (ref 7.350–7.450)

## 2015-04-16 LAB — BASIC METABOLIC PANEL
ANION GAP: 7 (ref 5–15)
ANION GAP: 8 (ref 5–15)
BUN: 23 mg/dL — AB (ref 6–20)
BUN: 23 mg/dL — ABNORMAL HIGH (ref 6–20)
CHLORIDE: 98 mmol/L — AB (ref 101–111)
CO2: 25 mmol/L (ref 22–32)
CO2: 25 mmol/L (ref 22–32)
Calcium: 8.3 mg/dL — ABNORMAL LOW (ref 8.9–10.3)
Calcium: 8.2 mg/dL — ABNORMAL LOW (ref 8.9–10.3)
Chloride: 100 mmol/L — ABNORMAL LOW (ref 101–111)
Creatinine, Ser: 1.3 mg/dL — ABNORMAL HIGH (ref 0.61–1.24)
Creatinine, Ser: 1.25 mg/dL — ABNORMAL HIGH (ref 0.61–1.24)
GFR calc Af Amer: 59 mL/min — ABNORMAL LOW (ref >60)
GFR calc non Af Amer: 51 mL/min — ABNORMAL LOW (ref >60)
GFR, EST NON AFRICAN AMERICAN: 54 mL/min — AB (ref >60)
GLUCOSE: 165 mg/dL — AB (ref 65–99)
Glucose, Bld: 163 mg/dL — ABNORMAL HIGH (ref 65–99)
POTASSIUM: 4.9 mmol/L (ref 3.5–5.1)
POTASSIUM: 4.2 mmol/L (ref 3.5–5.1)
SODIUM: 131 mmol/L — AB (ref 135–145)
Sodium: 132 mmol/L — ABNORMAL LOW (ref 135–145)

## 2015-04-16 LAB — GLUCOSE, CAPILLARY
GLUCOSE-CAPILLARY: 141 mg/dL — AB (ref 65–99)
GLUCOSE-CAPILLARY: 114 mg/dL — AB (ref 65–99)
GLUCOSE-CAPILLARY: 102 mg/dL — AB (ref 65–99)
Glucose-Capillary: 124 mg/dL — ABNORMAL HIGH (ref 65–99)
Glucose-Capillary: 134 mg/dL — ABNORMAL HIGH (ref 65–99)
Glucose-Capillary: 145 mg/dL — ABNORMAL HIGH (ref 65–99)
Glucose-Capillary: 148 mg/dL — ABNORMAL HIGH (ref 65–99)

## 2015-04-16 LAB — PROTIME-INR
INR: 1.52 — AB (ref 0.00–1.49)
Prothrombin Time: 18.4 seconds — ABNORMAL HIGH (ref 11.6–15.2)

## 2015-04-16 LAB — TRIGLYCERIDES: TRIGLYCERIDES: 151 mg/dL — AB (ref <150)

## 2015-04-16 MED ORDER — THIAMINE HCL 100 MG/ML IJ SOLN
100.0000 mg | Freq: Every day | INTRAMUSCULAR | Status: DC
Start: 2015-04-16 — End: 2015-04-19
  Administered 2015-04-16 – 2015-04-19 (×4): 100 mg via INTRAVENOUS
  Filled 2015-04-16 (×4): qty 2

## 2015-04-16 MED ORDER — DIAZEPAM 5 MG PO TABS
5.0000 mg | ORAL_TABLET | Freq: Four times a day (QID) | ORAL | Status: DC | PRN
  Administered 2015-04-16 – 2015-04-18 (×4): 5 mg via ORAL
  Filled 2015-04-16 (×4): qty 1

## 2015-04-16 MED ORDER — DEXMEDETOMIDINE HCL IN NACL 200 MCG/50ML IV SOLN: 0.2000 ug/kg/h | INTRAVENOUS | Status: DC

## 2015-04-16 MED ORDER — MIDAZOLAM HCL 2 MG/2ML IJ SOLN: 1.0000 mg | INTRAMUSCULAR | Status: DC | PRN

## 2015-04-16 NOTE — Procedures (Signed)
Extubation Procedure Note  Patient Details:   Name: Charles Bridges DOB: 11-08-1935 MRN: [id number]   Airway Documentation:     Evaluation  O2 sats: stable throughout Complications: No apparent complications Patient did tolerate procedure well. Bilateral Breath Sounds: Clear, Diminished   Yes  Suctioned prior. Positive cuff leak, patient vital signs are stable.  [name] 04/16/2015, 1:27 PM

## 2015-04-16 NOTE — Clinical Social Work Note (Signed)
CSW received consult for patient needing SNF placement, patient is still intubated unable to complete assessment will continue to follow patient's progress throughout discharge planning.  Ervin Knack. Anterhaus, MSW, [employer] [phone]

## 2015-04-16 NOTE — Plan of Care (Signed)
Problem: Consults Goal: Diagnosis - Spinal Surgery Outcome: Completed/Met Date Met:  04/16/15 Laminectomy

## 2015-04-16 NOTE — Progress Notes (Signed)
79 year old male who fell at home presented to OSH initial Cspine CT clear. Developed upper extremity weakness and was transferred to [facility]. CT spine and MRI showed C4-5 fracture with cord edema C3 -6. Taken to OR for decompression. Post op on vent.   Doing well on weaning trial. Propofol is off. Will try to extubate when patient is more awake.  Critical care time- 35 mins  Chilton Greathouse MD [facility] Pager [phone] If no answer or after 3pm call: [phone] 04/16/2015, 8:41 AM

## 2015-04-16 NOTE — Progress Notes (Signed)
eLink Physician-Brief Progress Note Patient Name: Charles Bridges DOB: 1936-07-26 MRN: [id number]   Date of Service  04/16/2015  HPI/Events of Note  Delirium - patient thinks that the table is moving and is asking for a beer.   eICU Interventions  Will order: 1. Thiamine 100 mg IV now and Q day. 2. Will order ICU CIWA protocol.     Intervention Category Major Interventions: Acid-Base disturbance - evaluation and management;Delirium, psychosis, severe agitation - evaluation and management  Sommer,Steven Eugene 04/16/2015, 4:32 PM

## 2015-04-16 NOTE — Progress Notes (Signed)
PT Cancellation Note  Patient Details Name: Charles Bridges MRN: [id number] DOB: Feb 17, 1936   Cancelled Treatment:    Reason Eval/Treat Not Completed: Medical issues which prohibited therapy.  Pt currently weaning. Possible extubation this pm. Will plan to see in am. Discussed with nsg. 04/16/2015  Berks Bing, PT [phone]  (pager)   [name], [name] 04/16/2015, 9:21 AM

## 2015-04-16 NOTE — Progress Notes (Signed)
Patient ID: Charles Bridges, male   DOB: Aug 16, 1935, 79 y.o.   MRN: [id number] BP 135/54 mmHg  Pulse 72  Temp(Src) 98.6 F (37 C) (Axillary)  Resp 14  Ht  (1.854 m)  Wt 103.148 kg (227 lb 6.4 oz)  BMI 30.01 kg/m2  SpO2 100% Alert, following all commands.  Very weak in upper extremities 2/5 triceps, biceps right, 1/5 grip intrinsics 2/5 triceps, biceps, grip, intrinsics 4/5 lower extremities Slightly worse after surgery, not unexpected given severity of stenosis. Wound dressing blood stained, dry.  Extubated today without incident.  Continue in Icu, transfer to floor tomorrow. Will need rehab.

## 2015-04-16 NOTE — Progress Notes (Signed)
OT Cancellation Note  Patient Details Name: Charles Bridges MRN: [id number] DOB: 26-Dec-1935   Cancelled Treatment:    Reason Eval/Treat Not Completed: Patient not medically ready Pt currently weaning. Possible extubation this pm. Will plan to see in am. Discussed with nsg. [facility] [name], OTR/L  [phone] 04/16/2015 04/16/2015, 8:44 AM

## 2015-04-16 NOTE — H&P (Deleted)
PULMONARY / CRITICAL CARE MEDICINE   Name: Charles Bridges MRN: [id number] DOB: [birth year]    ADMISSION DATE:  04/13/2015 CONSULTATION DATE:  04/15/2015  REFERRING MD :  Rhona Leavens  CHIEF COMPLAINT:  Spinal cord injury s/p fall  INITIAL PRESENTATION: 79 year old male who fell at home presented to OSH initial Cspine CT clear. Developed upper extremity weakness and was transferred to [facility]. CT spine and MRI showed C4-5 fracture with cord edema C3 -6. Taken to OR for decompression. Post op on vent. PCCM to see.   STUDIES:  9/19 CT C-spine > Fracture of the anterior C3-4 osteophyte spinal stenosis and foraminal narrowing most prominent C3-4 thru C5-6 where there is cord compression  9/19 MRI C-spine/Brain > Interval increase in retropharyngeal edema/hemorrhage maximal at the C2 through C4 level. Stenosis and cord injury C3-C6.   SIGNIFICANT EVENTS: 9/18 fall, admit to OSH, initial CT clear. Upper extremity weakness, Tx to cone 9/20 to OR for decompression, out on vent.   HISTORY OF PRESENT ILLNESS:  79 year old male with PMH as below, which is significant for CVA (on coumadin), carotid artery occlusion, ETOH abuse, and DM. 9/18 he fell twice while attempting to get into truck. He presented to [facility]. He was noted there to have rib fractures, ETOH level 169 and hyponatremia 127, but initial CT scan of head/Cspine was without apparent injury. While there he developed RUE weakness and was transferred to [facility] for additional neurology evaluation. Upon arrival he went for MRI however study was suboptimal due to motion. CT 9/19 showed spinal stenosis with foraminal narrowing, MRI was repeated which showed cord injury C3-6. 9/20 he was taken to OR under Dr. Franky Macho for decompression. He remained on the ventilator post operatively. PCCM to assist with vent/medical management.   PAST MEDICAL HISTORY :   has a past medical history of Carotid artery occlusion; Hypertension;  Hyperlipidemia; Arthritis; Stroke (1998 and  1999); Ulcer; History of vagotomy; Chronic diarrhea; Fall against object (Oct. 16, 2015); and Diabetes mellitus (2006).  has past surgical history that includes Carotid endarterectomy (03/02/2000); Fracture surgery; and fracture right leg. Prior to Admission medications   Medication Sig Start Date End Date Taking? Authorizing Provider  allopurinol (ZYLOPRIM) 300 MG tablet Take 300 mg by mouth daily as needed (gout).    Yes Historical Provider, MD  latanoprost (XALATAN) 0.005 % ophthalmic solution Place 1 drop into the right eye at bedtime. 04/03/15  Yes Historical Provider, MD  lisinopril-hydrochlorothiazide (PRINZIDE,ZESTORETIC) 20-12.5 MG per tablet Take 1 tablet by mouth daily.   Yes Historical Provider, MD  pravastatin (PRAVACHOL) 40 MG tablet Take 40 mg by mouth daily.   Yes Historical Provider, MD  warfarin (COUMADIN) 5 MG tablet Take 5 mg by mouth daily.   Yes Historical Provider, MD   No Known Allergies  FAMILY HISTORY:  indicated that his mother is deceased. He indicated that his father is deceased.  SOCIAL HISTORY:  reports that he quit smoking about 15 years ago. His smoking use included Cigarettes. He has never used smokeless tobacco. He reports that he drinks alcohol. He reports that he does not use illicit drugs.  REVIEW OF SYSTEMS:  Unable as intubated  SUBJECTIVE:   VITAL SIGNS: Temp:  [97.1 F (36.2 C)-98.2 F (36.8 C)] 97.4 F (36.3 C) (09/21 0752) Pulse Rate:  [55-69] 64 (09/21 0800) Resp:  [14-28] 18 (09/21 0800) BP: (108-175)/(44-77) 120/45 mmHg (09/21 0733) SpO2:  [97 %-100 %] 100 % (09/21 0800) FiO2 (%):  [  30 %-50 %] 30 % (09/21 0733) HEMODYNAMICS:   VENTILATOR SETTINGS: Vent Mode:  [-] PSV;CPAP FiO2 (%):  [30 %-50 %] 30 % Set Rate:  [14 bmp] 14 bmp Vt Set:  [630 mL] 630 mL PEEP:  [5 cmH20] 5 cmH20 Pressure Support:  [5 cmH20] 5 cmH20 Plateau Pressure:  [16 cmH20-19 cmH20] 16 cmH20 INTAKE /  OUTPUT:  Intake/Output Summary (Last 24 hours) at 04/16/15 0823 Last data filed at 04/16/15 0800  Gross per 24 hour  Intake 3090.39 ml  Output   2475 ml  Net 615.39 ml    PHYSICAL EXAMINATION: General:  No distress. On vent Neuro:  Unresponsive.  HEENT:  PERRL, moist mucus membranes Cardiovascular:  RRR, no MRG Lungs: Clear antr Abdomen:  Soft, non-distended, + BS Musculoskeletal:  No acute deformity Skin:  Grossly intact  LABS:  CBC  Recent Labs Lab 04/13/15 1842 04/15/15 0555  WBC 9.7 7.9  HGB 12.8* 11.0*  HCT 35.4* 30.1*  PLT 160 140*   Coag's  Recent Labs Lab 04/14/15 0512 04/15/15 0555 04/16/15 0229  INR 1.55* 1.47 1.52*   BMET  Recent Labs Lab 04/15/15 0555 04/15/15 2243 04/16/15 0229  NA 129* 132* 131*  K 3.9 4.9 4.2  CL 100* 100* 98*  CO2 21* 25 25  BUN 19 23* 23*  CREATININE 1.12 1.30* 1.25*  GLUCOSE 135* 165* 163*   Electrolytes  Recent Labs Lab 04/15/15 0555 04/15/15 2243 04/16/15 0229  CALCIUM 8.4* 8.3* 8.2*   Sepsis Markers  Recent Labs Lab 04/15/15 2243 04/16/15 0229  PROCALCITON <0.10 <0.10   ABG  Recent Labs Lab 04/15/15 2300  PHART 7.428  PCO2ART 33.2*  PO2ART 138*   Liver Enzymes No results for input(s): AST, ALT, ALKPHOS, BILITOT, ALBUMIN in the last 168 hours. Cardiac Enzymes No results for input(s): TROPONINI, PROBNP in the last 168 hours. Glucose  Recent Labs Lab 04/14/15 0435 04/15/15 1522 04/15/15 2234 04/15/15 2356 04/16/15 0403 04/16/15 0751  GLUCAP 153* 136* 154* 148* 141* 145*    Imaging  ASSESSMENT / PLAN:  NEUROLOGIC A:   C4-C5 fracture, Rib fx Spinal cord compression/edema C3-C6 s/p decompression 9/20 Hx CVA ETOH abuse, monitor for withdrawal  P:   RASS goal: -1 D/C propofol. PRN dilaudid for analgesia Neurosurgery following  PULMONARY OETT 9/20 >>> A: Acute respiratory failure in post operative setting ?Aspiration  P:   Full vent support VAP bundle Wake up and  place on breathing trial.   CARDIOVASCULAR A:  HTN HLD  P:  Tele On lisinopril and lopressor for BP control Ensure adequate analgesia PRN hydralazine  RENAL A:   Hyponatremia, suspect beer potomania.  P:   Repeat Bmet  GASTROINTESTINAL A:   No acute issues  P:   NPO IV protonix for PPI  HEMATOLOGIC A:   Chronic coumadin  P:  Holding anticoagulation due to surgery SCD  INFECTIOUS A:   Concern aspiration PNA  P:   Zosyn 9/19 >  PCT algorithm   ENDOCRINE A:   DM  P:   CBG monitoring and SSI  FAMILY  - Updates:   - Inter-disciplinary family meet or Palliative Care meeting due by:  9/27  Chilton Greathouse MD [facility] Pager [phone] If no answer or after 3pm call: [phone] 04/16/2015, 8:24 AM

## 2015-04-17 ENCOUNTER — Inpatient Hospital Stay (HOSPITAL_COMMUNITY): Payer: Medicare Other

## 2015-04-17 LAB — BASIC METABOLIC PANEL
ANION GAP: 7 (ref 5–15)
BUN: 20 mg/dL (ref 6–20)
CHLORIDE: 101 mmol/L (ref 101–111)
CO2: 27 mmol/L (ref 22–32)
Calcium: 8.1 mg/dL — ABNORMAL LOW (ref 8.9–10.3)
Creatinine, Ser: 1.04 mg/dL (ref 0.61–1.24)
GFR calc Af Amer: >60 mL/min (ref >60)
GLUCOSE: 120 mg/dL — AB (ref 65–99)
POTASSIUM: 4.3 mmol/L (ref 3.5–5.1)
SODIUM: 135 mmol/L (ref 135–145)

## 2015-04-17 LAB — CBC
HCT: 28.6 % — ABNORMAL LOW (ref 39.0–52.0)
HEMOGLOBIN: 9.7 g/dL — AB (ref 13.0–17.0)
MCH: 33 pg (ref 26.0–34.0)
MCHC: 33.9 g/dL (ref 30.0–36.0)
MCV: 97.3 fL (ref 78.0–100.0)
Platelets: 150 10*3/uL (ref 150–400)
RBC: 2.94 MIL/uL — AB (ref 4.22–5.81)
RDW: 14.3 % (ref 11.5–15.5)
WBC: 8.5 10*3/uL (ref 4.0–10.5)

## 2015-04-17 LAB — BLOOD GAS, ARTERIAL
ACID-BASE EXCESS: 1 mmol/L (ref 0.0–2.0)
Bicarbonate: 24.7 mEq/L — ABNORMAL HIGH (ref 20.0–24.0)
O2 Content: 3 L/min
O2 SAT: 96.5 %
PO2 ART: 85.8 mmHg (ref 80.0–100.0)
Patient temperature: 98.6
TCO2: 25.9 mmol/L (ref 0–100)
pCO2 arterial: 37 mmHg (ref 35.0–45.0)
pH, Arterial: 7.44 (ref 7.350–7.450)

## 2015-04-17 LAB — PROTIME-INR
INR: 1.26 (ref 0.00–1.49)
Prothrombin Time: 16 seconds — ABNORMAL HIGH (ref 11.6–15.2)

## 2015-04-17 LAB — MAGNESIUM: MAGNESIUM: 2.3 mg/dL (ref 1.7–2.4)

## 2015-04-17 LAB — SODIUM, URINE, RANDOM: Sodium, Ur: 142 mmol/L

## 2015-04-17 LAB — GLUCOSE, CAPILLARY
Glucose-Capillary: 117 mg/dL — ABNORMAL HIGH (ref 65–99)
Glucose-Capillary: 107 mg/dL — ABNORMAL HIGH (ref 65–99)

## 2015-04-17 LAB — PROCALCITONIN: Procalcitonin: <0.1 ng/mL

## 2015-04-17 LAB — PHOSPHORUS: Phosphorus: 3.2 mg/dL (ref 2.5–4.6)

## 2015-04-17 NOTE — Progress Notes (Signed)
Patient ID: Charles Bridges, male   DOB: 16-Dec-1935, 79 y.o.   MRN: [id number] BP 117/40 mmHg  Pulse 79  Temp(Src) 98.3 F (36.8 C) (Oral)  Resp 18  Ht  (1.854 m)  Wt 103.148 kg (227 lb 6.4 oz)  BMI 30.01 kg/m2  SpO2 95% Alert and oriented x 4, speech is clear and fluent Certainly weaker now than preop. Left upper extremity 1/5grip, intrinsics, 2/5 biceps, 2/5 triceps Right grip 1/5, intrinsics 1/5, 2/5 right biceps, triceps Left lower extremity 3/4, right lower extremity 4/5 Have full expectation that he will improve with time.  Collar can be removed when eating

## 2015-04-17 NOTE — H&P (Signed)
PULMONARY / CRITICAL CARE MEDICINE   Name: Charles Bridges MRN: [id number] DOB: 1936/06/27    ADMISSION DATE:  04/13/2015 CONSULTATION DATE:  04/15/2015  REFERRING MD :  Rhona Leavens  CHIEF COMPLAINT:  Spinal cord injury s/p fall  INITIAL PRESENTATION: 79 year old male who fell at home presented to OSH initial Cspine CT clear. Developed upper extremity weakness and was transferred to [facility] CT spine and MRI showed C4-5 fracture with cord edema C3 -6. Taken to OR for decompression. Post op on vent. PCCM to see.   STUDIES:  9/19 CT C-spine > Fracture of the anterior C3-4 osteophyte spinal stenosis and foraminal narrowing most prominent C3-4 thru C5-6 where there is cord compression  9/19 MRI C-spine/Brain > Interval increase in retropharyngeal edema/hemorrhage maximal at the C2 through C4 level. Stenosis and cord injury C3-C6.   SIGNIFICANT EVENTS: 9/18 fall, admit to OSH, initial CT clear. Upper extremity weakness, Tx to cone 9/20 to OR for decompression, out on vent.  9/21 Extubated  HISTORY OF PRESENT ILLNESS:  79 year old male with PMH as below, which is significant for CVA (on coumadin), carotid artery occlusion, ETOH abuse, and DM. 9/18 he fell twice while attempting to get into truck. He presented to [facility]. He was noted there to have rib fractures, ETOH level 169 and hyponatremia 127, but initial CT scan of head/Cspine was without apparent injury. While there he developed RUE weakness and was transferred to [facility] for additional neurology evaluation. Upon arrival he went for MRI however study was suboptimal due to motion. CT 9/19 showed spinal stenosis with foraminal narrowing, MRI was repeated which showed cord injury C3-6. 9/20 he was taken to OR under Dr. Franky Macho for decompression. He remained on the ventilator post operatively. PCCM to assist with vent/medical management.   PAST MEDICAL HISTORY :   has a past medical history of Carotid artery occlusion;  Hypertension; Hyperlipidemia; Arthritis; Stroke (1998 and  1999); Ulcer; History of vagotomy; Chronic diarrhea; Fall against object (Oct. 16, 2015); and Diabetes mellitus (2006).  has past surgical history that includes Carotid endarterectomy (03/02/2000); Fracture surgery; fracture right leg; Radiology with anesthesia (N/A, 04/15/2015); and Posterior cervical fusion/foraminotomy (N/A, 04/15/2015). Prior to Admission medications   Medication Sig Start Date End Date Taking? Authorizing Provider  allopurinol (ZYLOPRIM) 300 MG tablet Take 300 mg by mouth daily as needed (gout).    Yes Historical Provider, MD  latanoprost (XALATAN) 0.005 % ophthalmic solution Place 1 drop into the right eye at bedtime. 04/03/15  Yes Historical Provider, MD  lisinopril-hydrochlorothiazide (PRINZIDE,ZESTORETIC) 20-12.5 MG per tablet Take 1 tablet by mouth daily.   Yes Historical Provider, MD  pravastatin (PRAVACHOL) 40 MG tablet Take 40 mg by mouth daily.   Yes Historical Provider, MD  warfarin (COUMADIN) 5 MG tablet Take 5 mg by mouth daily.   Yes Historical Provider, MD   No Known Allergies  FAMILY HISTORY:  indicated that his mother is deceased. He indicated that his father is deceased.  SOCIAL HISTORY:  reports that he quit smoking about 15 years ago. His smoking use included Cigarettes. He has never used smokeless tobacco. He reports that he drinks alcohol. He reports that he does not use illicit drugs.  REVIEW OF SYSTEMS:  Unable as intubated  SUBJECTIVE/OVERNIGHT EVENTS Extubated. More delirious. Started on CIWA protocol.  VITAL SIGNS: Temp:  [97.7 F (36.5 C)-98.7 F (37.1 C)] 97.9 F (36.6 C) (09/22 0800) Pulse Rate:  [62-97] 72 (09/22 0800) Resp:  [7-22] 12 (09/22  0800) BP: (116-149)/(37-62) 124/40 mmHg (09/22 0800) SpO2:  [96 %-100 %] 100 % (09/22 0800) FiO2 (%):  [30 %] 30 % (09/21 1158) HEMODYNAMICS:   VENTILATOR SETTINGS: Vent Mode:  [-] PSV;CPAP FiO2 (%):  [30 %] 30 % PEEP:  [5 cmH20] 5  cmH20 Pressure Support:  [5 cmH20] 5 cmH20 INTAKE / OUTPUT:  Intake/Output Summary (Last 24 hours) at 04/17/15 0840 Last data filed at 04/17/15 0800  Gross per 24 hour  Intake   2160 ml  Output   3165 ml  Net  -1005 ml    PHYSICAL EXAMINATION: General:  Elderly male. Neuro:  Sedated, RASS -2 HEENT:  PERRL, pinpoint Cardiovascular:  RRR, no MRG Lungs:  Clear bilateral breath sounds Abdomen:  Soft, non-distended, Hyperactive Musculoskeletal:  No acute deformity Skin:  Grossly intact  LABS:  CBC  Recent Labs Lab 04/13/15 1842 04/15/15 0555 04/17/15 0300  WBC 9.7 7.9 8.5  HGB 12.8* 11.0* 9.7*  HCT 35.4* 30.1* 28.6*  PLT 160 140* 150   Coag's  Recent Labs Lab 04/15/15 0555 04/16/15 0229 04/17/15 0300  INR 1.47 1.52* 1.26   BMET  Recent Labs Lab 04/15/15 2243 04/16/15 0229 04/17/15 0300  NA 132* 131* 135  K 4.9 4.2 4.3  CL 100* 98* 101  CO2 BUN 23* 23* 20  CREATININE 1.30* 1.25* 1.04  GLUCOSE 165* 163* 120*   Electrolytes  Recent Labs Lab 04/15/15 2243 04/16/15 0229 04/17/15 0300  CALCIUM 8.3* 8.2* 8.1*  MG  --   --  2.3  PHOS  --   --  3.2   Sepsis Markers  Recent Labs Lab 04/15/15 2243 04/16/15 0229  PROCALCITON <0.10 <0.10   ABG  Recent Labs Lab 04/15/15 2300 04/17/15 0435  PHART 7.428 7.440  PCO2ART 33.2* 37.0  PO2ART 138* 85.8   Liver Enzymes No results for input(s): AST, ALT, ALKPHOS, BILITOT, ALBUMIN in the last 168 hours. Cardiac Enzymes No results for input(s): TROPONINI, PROBNP in the last 168 hours. Glucose  Recent Labs Lab 04/16/15 1120 04/16/15 1558 04/16/15 1937 04/16/15 2315 04/17/15 0321 04/17/15 0821  GLUCAP 134* 124* 114* 102* 117* 107*    Imaging Dg Chest Port 1 View  04/17/2015   CLINICAL DATA:  Shortness of breath.  EXAM: PORTABLE CHEST - 1 VIEW  COMPARISON:  04/15/2015 .  FINDINGS: Interim extubation. Stable cardiomegaly. Persistent bibasilar atelectasis and/or interstitial  infiltrates. Basilar interstitial edema cannot be excluded. Small left pleural effusion . No pneumothorax. No acute bony abnormality .  IMPRESSION: 1. Interim extubation.  2.  Persistent cardiomegaly.  3. Persistent bibasilar atelectasis and interstitial prominence. Bibasilar pneumonia and or interstitial edema cannot be excluded. Persistent small left pleural effusion .   Electronically Signed   By: Maisie Fus  Register   On: 04/17/2015 08:08   ASSESSMENT / PLAN:  NEUROLOGIC A:   C4-C5 fracture, Rib fx Spinal cord compression/edema C3-C6 s/p decompression 9/20 Hx CVA ETOH abuse, monitor for withdrawal  P:   CIWA protocol Neurosurgery following  PULMONARY OETT 9/20 >>> A: Acute respiratory failure in post operative setting ?Aspiration  P:   Doing well after extubation. Wean down O2 as tolerated.   CARDIOVASCULAR A:  HTN HLD  P:  Tele Started on lisinopril, metoprolol, statin. PRN hydralazine  RENAL A:   Hyponatremia, suspect beer potomania.  P:   Repeat Bmet  GASTROINTESTINAL A:   No acute issues  P:   NPO Speech swallow. IV protonix for PPI  HEMATOLOGIC A:  Chronic coumadin for CVA  P:  Holding anticoagulation due to surgery. Will need to assess if we need to restart it on discharge given h/o alcohol abuse and falls.  SCD  INFECTIOUS A:   Concern aspiration PNA  P:   Zosyn 9/19 >  PCT algorithm   ENDOCRINE A:   DM  P:   CBG monitoring and SSI  FAMILY  - Updates:  - Inter-disciplinary family meet or Palliative Care meeting due by:  9/27  Chilton Greathouse MD [facility] Pager [phone] If no answer or after 3pm call: [phone] 04/17/2015, 8:46 AM

## 2015-04-17 NOTE — Progress Notes (Signed)
Patient arrived to 5M16 from 10M. He is alert at this time. Denied pain. Family present. Upper extremeties elevated. Safety precautions and orders reviewed with patient/family. Will continue to monitor.   Havy. RN.

## 2015-04-17 NOTE — Evaluation (Signed)
Clinical/Bedside Swallow Evaluation Patient Details  Name: Charles Bridges MRN: [id number] Date of Birth: 11-12-1935  Today's Date: 04/17/2015 Time: SLP Start Time (ACUTE ONLY): 1033 SLP Stop Time (ACUTE ONLY): 1051 SLP Time Calculation (min) (ACUTE ONLY): 18 min  Past Medical History:  Past Medical History  Diagnosis Date  . Carotid artery occlusion   . Hypertension   . Hyperlipidemia   . Arthritis   . Stroke 1998 and  1999  . Ulcer     Hiatal Hernia / Gastric ulcers  . History of vagotomy   . Chronic diarrhea   . Fall against object Oct. 16, 2015    Pt got tangled up with dog chain out side in the woods.  . Diabetes mellitus 2006    TYPE 2   Past Surgical History:  Past Surgical History  Procedure Laterality Date  . Carotid endarterectomy  03/02/2000    Right CEA  . Fracture surgery      Right Shoulder  . Fracture right leg    . Radiology with anesthesia N/A 04/15/2015    Procedure: MRI CERVICAL SPINE WITHOUT CONTRAST    (RADIOLOGY WITH ANESTHESIA);  Surgeon: Medication Radiologist, MD;  Location: [facility] OR;  Service: Radiology;  Laterality: N/A;  . Posterior cervical fusion/foraminotomy N/A 04/15/2015    Procedure: POSTERIOR CERVICAL FUSION/FORAMINOTOMY LEVEL 3;  Surgeon: Coletta Memos, MD;  Location: [facility] OR;  Service: Neurosurgery;  Laterality: N/A;  C3-6 posterior cervical arthrodesis with instrumentation   HPI:  79 year old male who fell at home presented to OSH initial Cspine CT clear. Developed upper extremity weakness and was transferred to [facility]. CT spine and MRI showed C4-5 fracture with cord edema C3 -6. Taken to OR for decompression. Post op on vent 9/20-9/21.   Assessment / Plan / Recommendation Clinical Impression  Pt has multiple swallows with thin liquids, although initially without overt signs of airway compromise. As trials progressed, he began to have immediate throat clearing post-swallows of thin liquids. SLP assistance for smaller sips and slower rate were not  effective at eliminating throat clearing, however there were no signs concerning for airway comrpomise with nectar thick liquids or solids. Given pt's altered mentation, difficulty tolerating a fully upright position due to pain, and need for assistance with feeding, would favor initiating Dys 3 textures and nectar thick liquids. Will f/u to progress as tolerated.    Aspiration Risk  Mild    Diet Recommendation Dysphagia 3 (Mech soft);Nectar   Medication Administration: Whole meds with puree Compensations: Minimize environmental distractions;Slow rate;Small sips/bites    Other  Recommendations Oral Care Recommendations: Oral care BID Other Recommendations: Order thickener from pharmacy;Remove water pitcher;Prohibited food (jello, ice cream, thin soups)   Follow Up Recommendations       Frequency and Duration min 2x/week  2 weeks   Pertinent Vitals/Pain Pt reports pain is 20/10, RN made aware. Pt repositioned for comfort.    SLP Swallow Goals     Swallow Study Prior Functional Status       General Other Pertinent Information: 79 year old male who fell at home presented to OSH initial Cspine CT clear. Developed upper extremity weakness and was transferred to [facility]. CT spine and MRI showed C4-5 fracture with cord edema C3 -6. Taken to OR for decompression. Post op on vent 9/20-9/21. Type of Study: Bedside swallow evaluation Previous Swallow Assessment: none in chart Diet Prior to this Study: NPO Temperature Spikes Noted: No Respiratory Status: Supplemental O2 delivered via (comment) ([place]) History of Recent  Intubation: Yes Length of Intubations (days): 2 days Date extubated: 04/16/15 Behavior/Cognition: Alert;Cooperative;Confused;Requires cueing Oral Cavity - Dentition: Other (Comment) (dentures) Self-Feeding Abilities: Total assist Patient Positioning: Upright in bed Baseline Vocal Quality: Normal    Oral/Motor/Sensory Function Overall Oral Motor/Sensory Function: Appears within  functional limits for tasks assessed   Ice Chips Ice chips: Not tested   Thin Liquid Thin Liquid: Impaired Presentation: Straw Pharyngeal  Phase Impairments: Suspected delayed Swallow;Throat Clearing - Immediate    Nectar Thick Nectar Thick Liquid: Impaired Presentation: Straw Pharyngeal Phase Impairments: Suspected delayed Swallow   Honey Thick Honey Thick Liquid: Not tested   Puree Puree: Within functional limits Presentation: Spoon   Solid   Solid: Within functional limits      Maxcine Ham, M.A. CCC-SLP [phone]  Maxcine Ham 04/17/2015,11:08 AM

## 2015-04-17 NOTE — Progress Notes (Signed)
PT Cancellation Note  Patient Details Name: Charles Bridges MRN: [id number] DOB: 16-Apr-1936   Cancelled Treatment:    Reason Eval/Treat Not Completed: Medical issues which prohibited therapy.  Attempted eval, Pt in a lot of shoulder pain bil, wife wishes rest this pm.  Will see as able 9/23. 04/17/2015  [name], PT [phone]  (pager)   [name], [name] 04/17/2015, 3:52 PM

## 2015-04-17 NOTE — Progress Notes (Signed)
OT Cancellation Note  Patient Details Name: Charles Bridges MRN: [id number] DOB: 07/27/1935   Cancelled Treatment:    Reason Eval/Treat Not Completed: Other (comment) Attempted to eval this pm. Unable to fully awaken pt. Nsg reports pt had pain meds around 12pm. On second attempt nsg reports wife requested pt rest this pm. Pt c/o "intense"pain in B shoulders. nsg aware. Will see in am. [facility], OTR/L  [phone] 04/17/2015 04/17/2015, 2:59 PM

## 2015-04-17 NOTE — Progress Notes (Signed)
ANTIBIOTIC CONSULT NOTE -   Pharmacy Consult for zosyn Indication: pneumonia  No Known Allergies  Patient Measurements: Height:  (185.4 cm) Weight: 227 lb 6.4 oz (103.148 kg) IBW/kg (Calculated) : 79.9   Vital Signs: Temp: 97.9 F (36.6 C) (09/22 0800) Temp Source: Oral (09/22 0800) BP: 124/40 mmHg (09/22 0800) Pulse Rate: 72 (09/22 0800) Intake/Output from previous day: 09/21 0701 - 09/22 0700 In: 2085.6 [I.V.:1845.6; NG/GT:90; IV Piggyback:150] Out: 3270 [Urine:3270] Intake/Output from this shift:    Labs: Creatinine from OSH is 0.89.  INR is 1.51.     Recent Labs  04/15/15 0555 04/15/15 2243 04/16/15 0229 04/17/15 0300  WBC 7.9  --   --  8.5  HGB 11.0*  --   --  9.7*  PLT 140*  --   --  150  CREATININE 1.12 1.30* 1.25* 1.04   Estimated Creatinine Clearance: 75 mL/min (by C-G formula based on Cr of 1.04). No results for input(s): VANCOTROUGH, VANCOPEAK, VANCORANDOM, GENTTROUGH, GENTPEAK, GENTRANDOM, TOBRATROUGH, TOBRAPEAK, TOBRARND, AMIKACINPEAK, AMIKACINTROU, AMIKACIN in the last 72 hours.   Microbiology: Recent Results (from the past 720 hour(s))  Surgical pcr screen     Status: Abnormal   Collection Time: 04/15/15  7:55 AM  Result Value Ref Range Status   MRSA, PCR NEGATIVE NEGATIVE Final   Staphylococcus aureus POSITIVE (A) NEGATIVE Final    Comment:        The Xpert SA Assay (FDA approved for NASAL specimens in patients over 31 years of age), is one component of a comprehensive surveillance program.  Test performance has been validated by [facility] for patients greater than or equal to 29 year old. It is not intended to diagnose infection nor to guide or monitor treatment.     Assessment: 79 year old man transferred to [facility] from [facility] for further evaluation. Zosyn continues for possible aspiration pneumonia. Dose remains appropriate. Pt is afebrile and WBC is WNL. No cultures done.   Goal of Therapy:  Eradication of  infection  Plan:  - Continue zosyn 3.375g IV Q8H (4 hr inf) - F/u renal fxn, C&S, clinical status  - MD - Please establish planned LOT  *Pharmacy will sign off as no dose adjustments are anticipated. Thank you for the consult!  Lysle Pearl, PharmD, BCPS Pager # [phone] 04/17/2015 8:36 AM

## 2015-04-17 NOTE — Clinical Social Work Note (Signed)
CSW Consult Acknowledged:   CSW received a consult for SNF placement. Pt transferred to [facility] today. CSW awaiting PT/OT evaluation to determine the appropriate level of care.      Dysheka Bibbs, MSW, LCSWA [phone]

## 2015-04-18 DIAGNOSIS — I10 Essential (primary) hypertension: Secondary | ICD-10-CM

## 2015-04-18 DIAGNOSIS — Z8673 Personal history of transient ischemic attack (TIA), and cerebral infarction without residual deficits: Secondary | ICD-10-CM

## 2015-04-18 DIAGNOSIS — E785 Hyperlipidemia, unspecified: Secondary | ICD-10-CM

## 2015-04-18 DIAGNOSIS — E871 Hypo-osmolality and hyponatremia: Secondary | ICD-10-CM

## 2015-04-18 DIAGNOSIS — S2231XA Fracture of one rib, right side, initial encounter for closed fracture: Secondary | ICD-10-CM

## 2015-04-18 LAB — BASIC METABOLIC PANEL
Anion gap: 9 (ref 5–15)
BUN: 25 mg/dL — AB (ref 6–20)
CHLORIDE: 107 mmol/L (ref 101–111)
CO2: 22 mmol/L (ref 22–32)
Calcium: 8.2 mg/dL — ABNORMAL LOW (ref 8.9–10.3)
Creatinine, Ser: 1.12 mg/dL (ref 0.61–1.24)
GFR calc Af Amer: >60 mL/min (ref >60)
GFR calc non Af Amer: >60 mL/min (ref >60)
GLUCOSE: 101 mg/dL — AB (ref 65–99)
POTASSIUM: 3.8 mmol/L (ref 3.5–5.1)
SODIUM: 138 mmol/L (ref 135–145)

## 2015-04-18 LAB — CBC
HEMATOCRIT: 27.1 % — AB (ref 39.0–52.0)
HEMOGLOBIN: 9.4 g/dL — AB (ref 13.0–17.0)
MCH: 34.4 pg — AB (ref 26.0–34.0)
MCHC: 34.7 g/dL (ref 30.0–36.0)
MCV: 99.3 fL (ref 78.0–100.0)
Platelets: 160 10*3/uL (ref 150–400)
RBC: 2.73 MIL/uL — AB (ref 4.22–5.81)
RDW: 15.2 % (ref 11.5–15.5)
WBC: 6.6 10*3/uL (ref 4.0–10.5)

## 2015-04-18 LAB — PROTIME-INR
INR: 1.27 (ref 0.00–1.49)
Prothrombin Time: 16 seconds — ABNORMAL HIGH (ref 11.6–15.2)

## 2015-04-18 MED ORDER — POLYETHYLENE GLYCOL 3350 17 G PO PACK
17.0000 g | PACK | Freq: Two times a day (BID) | ORAL | Status: DC
  Administered 2015-04-18 – 2015-04-22 (×7): 17 g via ORAL
  Filled 2015-04-18 (×7): qty 1

## 2015-04-18 MED FILL — Propofol IV Emul 200 MG/20ML (10 MG/ML): INTRAVENOUS | Qty: 20 | Status: AC

## 2015-04-18 MED FILL — Lidocaine HCl IV Inj 20 MG/ML: INTRAVENOUS | Qty: 5 | Status: AC

## 2015-04-18 MED FILL — Succinylcholine Chloride Inj 20 MG/ML: INTRAMUSCULAR | Qty: 6 | Status: AC

## 2015-04-18 NOTE — Progress Notes (Signed)
Several attempts made to get progress note from Dr. Sharman Crate Hamrick's office per MD's orders without success. No one will pick up the phone.

## 2015-04-18 NOTE — Evaluation (Signed)
Physical Therapy Evaluation Patient Details Name: Charles Bridges MRN: [id number] DOB: Apr 11, 1936 Today's Date: 04/18/2015   History of Present Illness  79 year old male who fell at home presented to OSH initial Cspine CT clear. Developed upper extremity weakness and was transferred to [facility]. CT spine and MRI showed C4-5 fracture with cord edema C3 -6. Taken to OR for decompression. Post op on vent 9/20-9/21.  Clinical Impression  Patient presents with decreased independence with mobility due to deficits listed in PT problem list.  He will benefit from skilled PT in the acute setting to allow decreased burden of care at next venue.  Recommend SNF level rehab at d/c.    Follow Up Recommendations SNF    Equipment Recommendations  Other (comment) (TBA)    Recommendations for Other Services       Precautions / Restrictions Precautions Precautions: Fall;Cervical Required Braces or Orthoses: Cervical Brace Cervical Brace: Hard collar;At all times (may remove for eating)      Mobility  Bed Mobility Overal bed mobility: Needs Assistance Bed Mobility: Rolling;Sidelying to Sit;Sit to Sidelying Rolling: +2 for physical assistance;Max assist Sidelying to sit: +2 for physical assistance     Sit to sidelying: +2 for physical assistance;Total assist General bed mobility comments: attempting to assist with knee bent on opposite side and trying to assist with lifting trunk, fatigued at EOB so total assist to supine  Transfers                 General transfer comment: NT due to limited tolerance to sitting and weakness/fatigue  Ambulation/Gait                Stairs            Wheelchair Mobility    Modified Rankin (Stroke Patients Only)       Balance Overall balance assessment: Needs assistance Sitting-balance support: Bilateral upper extremity supported;Feet supported Sitting balance-Leahy Scale: Poor Sitting balance - Comments: falling posteriorly but attempts  to right himself with great effort able to anterior lean, but cannot maintain                                     Pertinent Vitals/Pain Pain Assessment: Faces Faces Pain Scale: Hurts whole lot Pain Location: both shoulders Pain Intervention(s): Limited activity within patient's tolerance;Premedicated before session;Repositioned    Home Living Family/patient expects to be discharged to:: Skilled nursing facility Living Arrangements: Spouse/significant other Available Help at Discharge: Family                  Prior Function Level of Independence: Independent               Hand Dominance        Extremity/Trunk Assessment   Upper Extremity Assessment: Defer to OT evaluation           Lower Extremity Assessment: RLE deficits/detail;LLE deficits/detail RLE Deficits / Details: AAROM WFL, with increased tone noted throughout;  strength grossly 2-/5 LLE Deficits / Details: AAROM WFL, increased tone noted, strength grossly 3-/5     Communication   Communication: HOH  Cognition Arousal/Alertness: Awake/alert   Overall Cognitive Status: Impaired/Different from baseline Area of Impairment: Orientation               General Comments: waxing and waning orientation    General Comments General comments (skin integrity, edema, etc.): wife in the room throughout treatment providing  encouragement/support for patient    Exercises General Exercises - Lower Extremity Ankle Circles/Pumps: PROM;AAROM;Both;Supine;5 reps Heel Slides: PROM;AAROM;Both;Other (comment) (2 reps)      Assessment/Plan    PT Assessment Patient needs continued PT services  PT Diagnosis Acute pain;Quadraplegia   PT Problem List Decreased strength;Decreased knowledge of use of DME;Decreased activity tolerance;Decreased balance;Decreased mobility;Impaired tone;Impaired sensation;Pain  PT Treatment Interventions DME instruction;Patient/family education;Wheelchair mobility  training;Functional mobility training;Therapeutic activities;Therapeutic exercise;Balance training;Neuromuscular re-education   PT Goals (Current goals can be found in the Care Plan section) Acute Rehab PT Goals Patient Stated Goal: To control pain PT Goal Formulation: With patient/family Time For Goal Achievement: 05/02/15 Potential to Achieve Goals: Fair    Frequency Min 3X/week   Barriers to discharge        Co-evaluation PT/OT/SLP Co-Evaluation/Treatment: Yes Reason for Co-Treatment: Complexity of the patient's impairments (multi-system involvement);For patient/therapist safety PT goals addressed during session: Strengthening/ROM;Mobility/safety with mobility         End of Session Equipment Utilized During Treatment: Cervical collar Activity Tolerance: Patient limited by pain;Patient limited by fatigue Patient left: in bed;with call bell/phone within reach;with family/visitor present           Time: 1610-9604 PT Time Calculation (min) (ACUTE ONLY): 27 min   Charges:   PT Evaluation $Initial PT Evaluation Tier I: 1 Procedure     PT G Codes:        WYNN,Charles Bridges 2015-05-02, 2:57 PM  Sheran Lawless, PT [phone] 05-02-15

## 2015-04-18 NOTE — Clinical Social Work Note (Signed)
Clinical Social Work Assessment  Patient Details  Name: Charles Bridges MRN: [id number] Date of Birth: 1935-12-03  Date of referral:  04/17/15               Reason for consult:  Facility Placement                Permission sought to share information with:  Family Supports Permission granted to share information::  Yes, Verbal Permission Granted  Name::     Jamare Vanatta  Relationship::  wife   Housing/Transportation Living arrangements for the past 2 months:  [place] of Information:  Spouse Patient Interpreter Needed:  None Criminal Activity/Legal Involvement Pertinent to Current Situation/Hospitalization:  No - Comment as needed Significant Relationships:  Spouse Lives with:  Spouse Do you feel safe going back to the place where you live?  No Need for family participation in patient care:  Yes (Comment)  Care giving concerns:  Wife reported that she can not care for the pt at home.    Social Worker assessment / plan:  CSW met with the pt's wife Enid Derry at the bedside. CSW introduced self and purpose of the visit. CSW discussed SNF rehab. CSW explained the SNF process. CSW explained insurance and its relation to SNF placement. CSW provided Enid Derry with a SNF list. CSW answered all questions in which the [place] inquired about. CSW will continue to follow this pt and assist with discharge as needed.   Employment status:  Retired Nurse, adult PT Recommendations:  Not assessed at this time [place] / Referral to community resources:  [place]  Patient/Family's Response to care:  Enid Derry reported that the care in which the pt has received has been well.   Patient/Family's Understanding of and Emotional Response to Diagnosis, Current Treatment, and Prognosis: Enid Derry acknowledged the pt's condition and agree to rehab.   Emotional Assessment Appearance:  Appears stated age Attitude/Demeanor/Rapport:   (Sleepy  ) Affect (typically observed):  Calm Orientation:  Oriented to Self Alcohol / Substance use:  Not Applicable Psych involvement (Current and /or in the community):  No (Comment)  Discharge Needs  Concerns to be addressed:  Denies Needs/Concerns at this time Readmission within the last 30 days:  No Current discharge risk:  None Barriers to Discharge:  No Barriers Identified   Bibbs, Dysheka, LCSW 04/18/2015, 4:45 PM

## 2015-04-18 NOTE — Evaluation (Signed)
Occupational Therapy Evaluation Patient Details Name: Charles Bridges MRN: [id number] DOB: 11-05-1935 Today's Date: 04/18/2015    History of Present Illness 79 year old male who fell at home presented to OSH initial Cspine CT clear. Developed upper extremity weakness and was transferred to [facility]. CT spine and MRI showed C4-5 fracture with cord edema C3 -6. Taken to OR for decompression. Post op on vent 9/20-9/21.   Clinical Impression   Pt was independent prior to admission.  Presents with generalized weakness and B shoulder pain.  Pt requires total assist for ADL and +2 assist for bed level mobility.  His sitting balance is poor.  Pt also demonstrating impaired cognition with variable orientation.  Will follow acutely.  Recommending SNF upon discharge for further rehab.    Follow Up Recommendations  SNF;Supervision/Assistance - 24 hour    Equipment Recommendations       Recommendations for Other Services       Precautions / Restrictions Precautions Precautions: Fall;Cervical Required Braces or Orthoses: Cervical Brace Cervical Brace: Hard collar;At all times (may remove for eating)      Mobility Bed Mobility Overal bed mobility: Needs Assistance Bed Mobility: Rolling;Sidelying to Sit;Sit to Sidelying Rolling: +2 for physical assistance;Max assist Sidelying to sit: +2 for physical assistance     Sit to sidelying: +2 for physical assistance;Total assist General bed mobility comments: attempting to assist with knee bent on opposite side and trying to assist with lifting trunk, fatigued at EOB so total assist to supine  Transfers                 General transfer comment: NT due to limited tolerance to sitting and weakness/fatigue    Balance Overall balance assessment: Needs assistance Sitting-balance support: Bilateral upper extremity supported;Feet supported Sitting balance-Leahy Scale: Poor Sitting balance - Comments: falling posteriorly but attempts to right  himself with great effort able to anterior lean, but cannot maintain                                    ADL Overall ADL's : Needs assistance/impaired                                       General ADL Comments: Pt requiring total assist for all ADL.     Vision     Perception     Praxis      Pertinent Vitals/Pain Pain Assessment: Faces Pain Score: 8  Faces Pain Scale: Hurts whole lot Pain Location: B shoulders Pain Descriptors / Indicators: Sore Pain Intervention(s): Limited activity within patient's tolerance;Monitored during session     Hand Dominance Right   Extremity/Trunk Assessment Upper Extremity Assessment Upper Extremity Assessment: RUE deficits/detail;LUE deficits/detail RUE Deficits / Details: mild edema, strength 2-/5 shoulder, 2/5 elbow, 1/5 forearm and wrist, 2/5 grip RUE Sensation:  (reports tingling) RUE Coordination: decreased fine motor;decreased gross motor LUE Deficits / Details: mild edema, shoulder strength 1/5, elbow 2/5, forearm 2/5, 0/5 wrist and hand LUE Sensation:  (reports tingling) LUE Coordination: decreased fine motor;decreased gross motor   Lower Extremity Assessment Lower Extremity Assessment: Defer to PT evaluation RLE Deficits / Details: AAROM WFL, with increased tone noted throughout;  strength grossly 2-/5 RLE Sensation: decreased light touch LLE Deficits / Details: AAROM WFL, increased tone noted, strength grossly 3-/5 LLE Sensation: decreased light touch  Communication Communication Communication: HOH   Cognition Arousal/Alertness: Awake/alert   Overall Cognitive Status: Impaired/Different from baseline Area of Impairment: Orientation               General Comments: waxing and waning orientation, hallucinations   General Comments       Exercises       Shoulder Instructions      Home Living Family/patient expects to be discharged to:: Skilled nursing facility Living  Arrangements: Spouse/significant other Available Help at Discharge: Family                                    Prior Functioning/Environment Level of Independence: Independent             OT Diagnosis: Generalized weakness;Acute pain;Cognitive deficits;Paresis   OT Problem List: Decreased strength;Decreased activity tolerance;Impaired balance (sitting and/or standing);Decreased coordination;Decreased cognition;Decreased knowledge of precautions;Impaired sensation;Impaired UE functional use;Pain;Increased edema   OT Treatment/Interventions: Therapeutic exercise;Therapeutic activities;Cognitive remediation/compensation;Patient/family education;Balance training    OT Goals(Current goals can be found in the care plan section) Acute Rehab OT Goals Patient Stated Goal: To control pain OT Goal Formulation: With patient Time For Goal Achievement: 05/02/15 Potential to Achieve Goals: Fair ADL Goals Pt/caregiver will Perform Home Exercise Program: Both right and left upper extremity;With minimal assist (with wife assisting) Additional ADL Goal #1: Pt will perform bed mobility with moderate assistance in preparation for EOB activities. Additional ADL Goal #2: Pt will sit EOB x 5 minutes with UB support and min assist as a precursor to ADL. Additional ADL Goal #3: Pt will use environmental cues for orientation independently.  OT Frequency: Min 2X/week   Barriers to D/C:            Co-evaluation PT/OT/SLP Co-Evaluation/Treatment: Yes Reason for Co-Treatment: Complexity of the patient's impairments (multi-system involvement);For patient/therapist safety PT goals addressed during session: Strengthening/ROM;Mobility/safety with mobility OT goals addressed during session: ADL's and self-care      End of Session Equipment Utilized During Treatment: Cervical collar  Activity Tolerance: Patient limited by fatigue;Patient limited by pain Patient left: in bed;with call  bell/phone within reach;with family/visitor present   Time: 1315-1340 OT Time Calculation (min): 25 min Charges:  OT General Charges $OT Visit: 1 Procedure OT Evaluation $Initial OT Evaluation Tier I: 1 Procedure G-Codes:    Evern Bio 04/18/2015, 3:23 PM  [phone]

## 2015-04-18 NOTE — Progress Notes (Signed)
PATIENT DETAILS Name: Charles Bridges Age: 79 y.o. Sex: male Date of Birth: 1935-08-16 Admit Date: 04/13/2015 Admitting Physician Coletta Memos, MD ZOX:[id number],[id number] L, MD  Brief narrative:  79 year old male with history of prior CVA,on Coumadin, history of carotid artery occlusion status post CEA, EtOH use who fell at home and presented to OSH.Initial Cspine CT clear. Developed upper extremity weakness and was transferred to [facility]. CT spine and MRI showed C4-5 fracture with cord edema C3 -6. Taken to OR for decompression. Post op was placed on a ventilator and managed in the intensive care unit. Extubated on 9/21, and transferred to the [facility] on 9/23.  Subjective: Complains of pain in the neck, right shoulder, right arm. Somewhat confused as well.  Assessment/Plan: Principal Problem: Fracture of anterior osteophyte at C4-C5 level with Severe C4-5 spinal stenosis and cord compression: Underwent decompression on 9/20. Still with significant quadriparesis-bilateral upper extremity around 2/5, bilateral lower extremity around 3/5. Neurosurgery following, PT evaluation pending-suspect will likely require CIR.  Active Problems: Acute respiratory failure in post operative setting: Extubated on 9/21. Currently doing well on room air.  Suspected aspiration pneumonia: Continue Zosyn, afebrile, and without leukocytosis.  Acute encephalopathy: Suspect multifactorial etiology-EtOH withdrawal/narcotic/pneumonia all contributing. Continue supportive care.  Essential hypertension: Controlled, continue lisinopril, HCTZ and metoprolol. Follow and titrate accordingly  HLD (hyperlipidemia): Continue with statin.  Rib fracture: Incentive spirometry, supportive care.  Chronic Coumadin use:? Reason-family not aware of diagnoses of A. fib or DVT/PE. Will obtain records from PCPs office. For now given history of numerous falls, EtOH use-would continue to hold  Coumadin.  Anemia: Likely secondary to acute/critical illness. Follow. No indication for transfusion.  History of CVA/carotid artery disease-status post carotid endarterectomy: Nonfocal exam, appears to be stable. Will resume aspirin in the next few days.  History of peptic ulcer disease: Continue with PPI, is s/p vagotomy.  Hyponatremia: Resolved. Most likely secondary to beer potomania  Disposition: Remain inpatient-await PT recommendation  Antimicrobial agents  See below  Anti-infectives    Start     Dose/Rate Route Frequency Ordered Stop   04/14/15 0800  vancomycin (VANCOCIN) IVPB 1000 mg/200 mL premix  Status:  Discontinued     1,000 mg 200 mL/hr over 60 Minutes Intravenous Every 12 hours 04/13/15 1855 04/15/15 0928   04/13/15 1930  piperacillin-tazobactam (ZOSYN) IVPB 3.375 g     3.375 g 12.5 mL/hr over 240 Minutes Intravenous 3 times per day 04/13/15 1855     04/13/15 1900  vancomycin (VANCOCIN) 1,750 mg in sodium chloride 0.9 % 500 mL IVPB     1,750 mg 250 mL/hr over 120 Minutes Intravenous  Once 04/13/15 1855 04/13/15 2255      DVT Prophylaxis: SCD's  Code Status: Full code  Family Communication Spouse and son at bedside  Procedures: 9/20 to OR for decompression  CONSULTS:  pulmonary/intensive care and Neurosurgery  Time spent 30 minutes-Greater than 50% of this time was spent in counseling, explanation of diagnosis, planning of further management, and coordination of care.  MEDICATIONS: Scheduled Meds: . folic acid  1 mg Oral Daily  . lisinopril  20 mg Oral Daily   And  . hydrochlorothiazide  12.5 mg Oral Daily  . latanoprost  1 drop Right Eye QHS  . metoprolol tartrate  50 mg Oral BID  . multivitamin with minerals  1 tablet Oral Daily  . pantoprazole (PROTONIX) IV  40 mg Intravenous Daily  .  piperacillin-tazobactam (ZOSYN)  IV  3.375 g Intravenous 3 times per day  . pravastatin  40 mg Oral q1800  . senna  1 tablet Oral BID  . thiamine IV  100  mg Intravenous Daily   Continuous Infusions: . 0.9 % NaCl with KCl 20 mEq / L 80 mL/hr at 04/18/15 0109   PRN Meds:.acetaminophen **OR** acetaminophen, bisacodyl, diazepam, hydrALAZINE, HYDROcodone-acetaminophen, HYDROmorphone (DILAUDID) injection, magnesium citrate, menthol-cetylpyridinium **OR** phenol, ondansetron (ZOFRAN) IV, senna-docusate    PHYSICAL EXAM: Vital signs in last 24 hours: Filed Vitals:   04/17/15 2253 04/18/15 0120 04/18/15 0500 04/18/15 0943  BP: 135/47 129/39 134/43 132/47  Pulse: 71 64 69 65  Temp: 98.7 F (37.1 C) 99.2 F (37.3 C) 98.7 F (37.1 C) 98.9 F (37.2 C)  TempSrc: Oral Oral Oral Oral  Resp: 16 18 18 16   Height:      Weight:      SpO2: 90% 92% 92% 95%    Weight change:  Filed Weights   04/13/15 1630  Weight: 103.148 kg (227 lb 6.4 oz)   Body mass index is 30.01 kg/(m^2).   Gen Exam: Awake, not in any distress. Somewhat confused but answers some questions appropriately. Neck: Cervical collar in place Chest: Clear to auscultation anteriorly CVS: S1 S2 Regular Abdomen: soft, BS +, non tender, non distended.  Extremities: no edema, lower extremities warm to touch. Neurologic: See above Skin: No Rash.   Wounds: N/A.   Intake/Output from previous day:  Intake/Output Summary (Last 24 hours) at 04/18/15 1146 Last data filed at 04/18/15 0500  Gross per 24 hour  Intake   1010 ml  Output   1400 ml  Net   -390 ml     LAB RESULTS: CBC  Recent Labs Lab 04/13/15 1842 04/15/15 0555 04/17/15 0300 04/18/15 0515  WBC 9.7 7.9 8.5 6.6  HGB 12.8* 11.0* 9.7* 9.4*  HCT 35.4* 30.1* 28.6* 27.1*  PLT 160 140* 150 160  MCV 92.2 93.2 97.3 99.3  MCH 33.3 34.1* 33.0 34.4*  MCHC 36.2* 36.5* 33.9 34.7  RDW 13.3 13.6 14.3 15.2    Chemistries   Recent Labs Lab 04/15/15 0555 04/15/15 2243 04/16/15 0229 04/17/15 0300 04/18/15 0515  NA 129* 132* 131* 135 138  K 3.9 4.9 4.2 4.3 3.8  CL 100* 100* 98* 101 107  CO2 21* 25 25 27 22   GLUCOSE  135* 165* 163* 120* 101*  BUN 19 23* 23* 20 25*  CREATININE 1.12 1.30* 1.25* 1.04 1.12  CALCIUM 8.4* 8.3* 8.2* 8.1* 8.2*  MG  --   --   --  2.3  --     CBG:  Recent Labs Lab 04/16/15 1558 04/16/15 1937 04/16/15 2315 04/17/15 0321 04/17/15 0821  GLUCAP 124* 114* 102* 117* 107*    GFR Estimated Creatinine Clearance: 69.7 mL/min (by C-G formula based on Cr of 1.12).  Coagulation profile  Recent Labs Lab 04/14/15 0512 04/15/15 0555 04/16/15 0229 04/17/15 0300 04/18/15 0515  INR 1.55* 1.47 1.52* 1.26 1.27    Cardiac Enzymes No results for input(s): CKMB, TROPONINI, MYOGLOBIN in the last 168 hours.  Invalid input(s): CK  Invalid input(s): POCBNP No results for input(s): DDIMER in the last 72 hours. No results for input(s): HGBA1C in the last 72 hours.  Recent Labs  04/15/15 2330  TRIG 151*   No results for input(s): TSH, T4TOTAL, T3FREE, THYROIDAB in the last 72 hours.  Invalid input(s): FREET3 No results for input(s): VITAMINB12, FOLATE, FERRITIN, TIBC, IRON, RETICCTPCT in the  last 72 hours. No results for input(s): LIPASE, AMYLASE in the last 72 hours.  Urine Studies No results for input(s): UHGB, CRYS in the last 72 hours.  Invalid input(s): UACOL, UAPR, USPG, UPH, UTP, UGL, UKET, UBIL, UNIT, UROB, ULEU, UEPI, UWBC, URBC, UBAC, CAST, UCOM, BILUA  MICROBIOLOGY: Recent Results (from the past 240 hour(s))  Surgical pcr screen     Status: Abnormal   Collection Time: 04/15/15  7:55 AM  Result Value Ref Range Status   MRSA, PCR NEGATIVE NEGATIVE Final   Staphylococcus aureus POSITIVE (A) NEGATIVE Final    Comment:        The Xpert SA Assay (FDA approved for NASAL specimens in patients over 41 years of age), is one component of a comprehensive surveillance program.  Test performance has been validated by [employer] for patients greater than or equal to 75 year old. It is not intended to diagnose infection nor to guide or monitor treatment.      RADIOLOGY STUDIES/RESULTS: Ct Cervical Spine Wo Contrast  04/14/2015   CLINICAL DATA:  79 year old male post trauma. Weakness. Subsequent encounter.  EXAM: CT CERVICAL SPINE WITHOUT CONTRAST  TECHNIQUE: Multidetector CT imaging of the cervical spine was performed without intravenous contrast. Multiplanar CT image reconstructions were also generated.  COMPARISON:  04/14/2015 cervical spine MR.  FINDINGS: Flowing confluent osteophyte C6 through T1 with fusion across the disc spaces.  Prominent anterior osteophyte C5-6 with areas of fusion of the anterior osteophyte.  On the accompanying motion degraded MR, there is prevertebral edema/hemorrhage. Edema extends across the C4-5 anterior longitudinal ligament and disc space consistent with soft tissue injury. Possible mild edema right C4-5 interlaminar ligament. Fracture of the anterior osteophyte at the C4-5 level (although the borders appear fairly well defined) may have recurrent with hyperextension.  Fracture of the anterior C3-4 osteophyte suspected.  With the patient in a cervical collar, there is relatively normal alignment of the cervical spine.  Prominent cervical spondylotic changes with various degrees of spinal stenosis and foraminal narrowing most prominent C3-4 thru C5-6 where there is cord compression or cord edema extending from C3 through upper C6 on recent MR.  Patient will be scheduled for repeat MR.  IMPRESSION: On the accompanying motion degraded MR, there is prevertebral edema/hemorrhage. Edema extends across the C4-5 anterior longitudinal ligament and disc space consistent with soft tissue injury. Possible mild edema right C4-5 interlaminar ligament. Fracture of the anterior osteophyte at the C4-5 level (although the borders appear fairly well defined) may have recurrent with hyperextension.  Fracture of the anterior C3-4 osteophyte suspected.  Prominent cervical spondylotic changes with various degrees of spinal stenosis and foraminal  narrowing most prominent C3-4 thru C5-6 where there is cord compression or cord edema extending from C3 through upper C6 on recent MR.  These results were reviewed with Dr. Franky Macho 04/14/2015  at 8:29 pm.   Electronically Signed   By: Lacy Duverney M.D.   On: 04/14/2015 20:55   Mr Brain Wo Contrast  04/14/2015   CLINICAL DATA:  Recent fall, worsening of RIGHT arm paresthesias and weakness. History of carotid artery occlusion, hypertension, diabetes, stroke.  EXAM: MRI HEAD WITHOUT CONTRAST  TECHNIQUE: Multiplanar, multiecho pulse sequences of the brain and surrounding structures were obtained without intravenous contrast.  COMPARISON:  None.  FINDINGS: Multiple sequences are mild to moderately motion degraded.  No reduced diffusion to suggest acute ischemia. No susceptibility artifact to suggest hemorrhage. Moderate to severe ventriculomegaly with proportional enlargement of cerebral sulci and cerebellar folia.  Old RIGHT basal ganglia and bilateral thalamus lacunar infarcts. No midline shift, mass effect or mass lesions. RIGHT cerebral peduncle volume loss compatible with wallerian degeneration. A few scattered subcentimeter supratentorial white matter T2 hyperintensities, and T2 hyperintense signal within the pons consistent with mild chronic small vessel ischemic disease. No midline shift, mass effect or mass lesions.  No abnormal extra-axial fluid collections. Normal major intracranial vascular flow voids seen at the skull base.  Status post bilateral ocular lens implants. Moderate paranasal sinus mucosal thickening and probably inspissated mucus RIGHT maxillary sinus. The mastoid air cells are well aerated. No abnormal sellar expansion. No cerebellar tonsillar ectopia. No suspicious calvarial bone marrow signal. Patient is edentulous.  IMPRESSION: No acute intracranial process on this motion degraded examination.  Old RIGHT basal ganglia and bilateral thalamus infarcts.  Moderate to severe global brain  atrophy. Mild chronic small vessel ischemic disease.   Electronically Signed   By: Awilda Metro M.D.   On: 04/14/2015 01:44   Mr Cervical Spine Wo Contrast  04/15/2015   CLINICAL DATA:  79 year old post trauma. Weakness. Subsequent encounter.  EXAM: MRI CERVICAL SPINE WITHOUT CONTRAST  TECHNIQUE: Multiplanar, multisequence MR imaging of the cervical spine was performed. No intravenous contrast was administered.  COMPARISON:  04/14/2015 CT and MR.  FINDINGS: Exam was attempted without sedation and revealed increase in the degree of retropharyngeal edema/ hemorrhage. Axial images remain motion degraded. Preliminary results discussed with Dr. Franky Macho and it was elected to perform examination with anesthesia/moderate sedation.  On repeat imaging with sedation, retropharyngeal edema/hemorrhage is once again noted located anterior to the longus coli muscles extending from the clivus to the upper C5 level and maximal at the C2 through C4 level.  Injury through the C4-5 anterior longitudinal ligament, C4-5 disc space extending to involve the C4-5 facet joints and posterior paraspinal musculature consistent with extension injury. Buckling of the posterior ligaments which appear edematous. Short pedicles. Broad-based disc osteophyte complex. Facet joint bony overgrowth. Uncinate hypertrophy. Severe C4-5 spinal stenosis and cord compression greater on the left (cord flattening to 3 mm). Marked bilateral foraminal narrowing.  Spur posterior C5 vertebral body greater on left with severe cord compression greater on the left.  C5-6 broad-based disc osteophyte complex with slight cephalad extension. Moderate to marked left-sided and moderate right-sided spinal stenosis. Marked bilateral foraminal narrowing.  Edema within the cord extends from the C3 to the C6 level maximal at the C4 and C5 level. Gradient sequence without focal cord hemorrhage.  C2-3: Shallow broad-based disc osteophyte complex greater to right. Mild  narrowing ventral aspect of the thecal sac without cord compression.  C3-4: No edema involving the C3-4 anterior osteophyte to suggest acute fracture as questioned on recent CT. Broad-based disc osteophyte complex greater to left. Moderate spinal stenosis greater on left. Facet joint degenerative changes. Marked left-sided and moderate right-sided foraminal narrowing.  Flowing osteophyte with fusion C6 through T2.  C6-7: Bulge and osteophyte with mild narrowing ventral aspect of the thecal sac greater on the left. Mild bilateral foraminal narrowing.  C7-T1: Uncinate hypertrophy. Mild to moderate foraminal narrowing bilaterally.  Small amount of fluid within the C1 occipital condyle articulation bilaterally and C1-C2 lateral mass joint bilaterally without surrounding bone edema or soft tissue edema (i.e., separate from the retropharyngeal edema) to suggest significant osseous or ligamentous injury.  IMPRESSION: Interval increase in retropharyngeal edema/hemorrhage maximal at the C2 through C4 level.  Injury through the C4-5 anterior longitudinal ligament, C4-5 disc space extending to involve the C4-5 facet joints  and posterior paraspinal musculature consistent with extension injury. Buckling of the posterior ligaments which appear edematous. Short pedicles. Broad-based disc osteophyte complex. Facet joint bony overgrowth. Uncinate hypertrophy. Severe C4-5 spinal stenosis and cord compression greater on the left (cord flattening to 3 mm). Marked bilateral foraminal narrowing.  Spur posterior C5 vertebral body greater on left with severe cord compression greater on the left.  C5-6 broad-based disc osteophyte complex with slight cephalad extension. Moderate to marked left-sided and moderate right-sided spinal stenosis. Marked bilateral foraminal narrowing.  Edema within the cord extends from the C3 to the C6 level maximal at the C4 and C5 level. Gradient sequence without focal cord hemorrhage.  C2-3 shallow broad-based  disc osteophyte complex greater to right. Mild narrowing ventral aspect of the thecal sac without cord compression.  C3-4 broad-based disc osteophyte complex greater to left. Moderate spinal stenosis greater on left. Marked left-sided and moderate right-sided foraminal narrowing. No edema involving the C3-4 anterior osteophyte to suggest acute fracture as questioned on recent CT.  Flowing osteophyte with fusion C6 through T2.  C6-7 bulge and osteophyte with mild narrowing ventral aspect of the thecal sac greater on the left. Mild bilateral foraminal narrowing.  C7-T1 mild to moderate foraminal narrowing bilaterally.  Small amount of fluid within the C1 occipital condyle articulation bilaterally and C1-C2 lateral mass joint bilaterally without surrounding bone edema or soft tissue edema (i.e., separate from the retropharyngeal edema) to suggest significant osseous or ligamentous injury.  These results were called by telephone at the time of interpretation on 04/15/2015 at 1:44 pm to Dr. Coletta Memos , who verbally acknowledged these results.   Electronically Signed   By: Lacy Duverney M.D.   On: 04/15/2015 14:24   Mr Cervical Spine Wo Contrast  04/14/2015   ADDENDUM REPORT: 04/14/2015 11:02 ADDENDUM: Original report by Dr. Karie Kirks. Addendum by Dr. Mosetta Putt: Abnormal T2 hyperintensity within the spinal cord is most consistent with edema, with myelomalacia a less likely consideration. Study discussed in person with Dr. Franky Macho on 04/14/2015 at 10 a.m. Electronically Signed   By: Sebastian Ache M.D.   On: 04/14/2015 11:02  04/14/2015   CLINICAL DATA:  Worsening RIGHT arm paresthesias after recent fall. History of carotid artery occlusion, hypertension, diabetes and stroke.  EXAM: MRI CERVICAL SPINE WITHOUT CONTRAST  TECHNIQUE: Multiplanar, multisequence MR imaging of the cervical spine was performed. No intravenous contrast was administered.  COMPARISON:  None.  FINDINGS: Motion degraded examination, per technologist  note, patient had difficulty remaining still even with sedation. Axial T2 and T2 gradient sequences nearly nondiagnostic, severely degraded sagittal STIR.  Cervical vertebral bodies and posterior elements appear intact and aligned with maintenance of cervical lordosis. However, there is mild widening of the C4-5 disc space ventrally, associated with bright STIR signal concerning for fracture, likely throughout osteophyte. Severe C5-6 disc height loss. Bridging bone marrow signal C6-7 and T1-2 consistent with auto interbody arthrodesis. No malalignment. Maintenance of cervical lordosis. Moderate to severe chronic discogenic endplate changes C4-5, C5-6. Disc desiccation C2-3 through C5-6 and, C7-T1.  Bright T2 signal within the spinal cord from C3 - C5-6, most consistent with cord edema or pre syrinx without frank syrinx. Craniocervical junction intact. Probable disruption of the anterior longitudinal ligament which is likely calcified at C4-5. Small apparent retropharyngeal versus prevertebral effusion, difficult to evaluate due to patient motion.  Severely limited axial gradient sequences. 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 Superimposed degenerative change resulting in severe canal stenosis C3-4 thru C5-6, corroborated  by sagittal imaging, AP dimension the canal is approximately 6 mm. Probable neural foraminal narrowing midcervical spine, limited assessment.  IMPRESSION: Motion degraded examination, nearly nondiagnostic axial sequences.  Acute fracture through the C4-5 anterior disc space which is likely calcified, including suspected focal disruption of the anterior longitudinal ligament, concerning for hyperextension injury. Probable prevertebral versus retropharyngeal effusion.  Spinal cord edema/pre syrinx C3 through C5-6 without frank syrinx.  Degenerative change of the cervical spine superimposed on congenital canal stenosis. Severe canal stenosis C3-4 through C5-6.  Probable neural foraminal narrowing.  Recommend CT cervical spine to assess bony changes.  Electronically Signed: By: Awilda Metro M.D. On: 04/14/2015 02:33   Dg Chest Port 1 View  04/17/2015   CLINICAL DATA:  Shortness of breath.  EXAM: PORTABLE CHEST - 1 VIEW  COMPARISON:  04/15/2015 .  FINDINGS: Interim extubation. Stable cardiomegaly. Persistent bibasilar atelectasis and/or interstitial infiltrates. Basilar interstitial edema cannot be excluded. Small left pleural effusion . No pneumothorax. No acute bony abnormality .  IMPRESSION: 1. Interim extubation.  2.  Persistent cardiomegaly.  3. Persistent bibasilar atelectasis and interstitial prominence. Bibasilar pneumonia and or interstitial edema cannot be excluded. Persistent small left pleural effusion .   Electronically Signed   By: Maisie Fus  Register   On: 04/17/2015 08:08   Portable Chest Xray  04/15/2015   CLINICAL DATA:  79 year old male status post intubation.  EXAM: PORTABLE CHEST - 1 VIEW  COMPARISON:  Radiograph dated 04/13/2015 and CT dated 04/13/2015.  FINDINGS: An endotracheal tube is seen with tip approximately 5.5 cm above the carina. There is a small left pleural effusion. Bibasilar atelectatic changes again noted. Superimposed pneumonia is not excluded. There is no pneumothorax. Stable cardiac silhouette. The osseous structures appear grossly unremarkable. Right humeral head fixation pin.  IMPRESSION: Endotracheal tube above the carina.   Electronically Signed   By: Elgie Collard M.D.   On: 04/15/2015 23:22   Dg C-arm 1-60 Min-no Report  04/15/2015   CLINICAL DATA: for posterior cervical fusion,   C-ARM 1-60 MINUTES  Fluoroscopy was utilized by the requesting physician.  No radiographic  interpretation.     Jeoffrey Massed, MD  [facility] Pager:[phone]  If 7PM-7AM, please contact night-coverage [email or web address] Password [email or web address] 04/18/2015, 11:46 AM   LOS: 5 days

## 2015-04-18 NOTE — Care Management Important Message (Signed)
Important Message  Patient Details  Name: WILMAN TUCKER MRN: [id number] Date of Birth: Aug 07, 1935   Medicare Important Message Given:  Yes-second notification given    Bernadette Hoit 04/18/2015, 11:10 AM

## 2015-04-18 NOTE — Clinical Documentation Improvement (Signed)
Internal Medicine Critical Care  Abnormal Lab/Test Results:  BUN/CR/GFR  Possible Clinical Conditions associated with below indicators  Acute renal failure  Other Condition  Cannot Clinically Determine   Supporting Information:  S/P Cervical posterior arthrodesis & laminectomies  Component     Latest Ref Rng 04/14/2015 04/15/2015 04/15/2015 04/16/2015          5:55 AM 10:43 PM   BUN     6 - 20 mg/dL _0 (H) 23 (H)  Creatinine     0.61 - 1.24 mg/dL 1.02 1.12 1.30 (H) 1.25 (H)        EGFR (Non-African Amer.)     >60 mL/min >60 >60 51 (L) 54 (L)  EGFR (African American)     >60 mL/min >60 >60 59 (L) >60   Component     Latest Ref Rng 04/17/2015 04/18/2015           BUN     6 - 20 mg/dL 20 25 (H)  Creatinine     0.61 - 1.24 mg/dL 1.04 1.12    8.2 (L)  EGFR (Non-African Amer.)     >60 mL/min >60 >60  EGFR (African American)     >60 mL/min >60 >60   Treatment Provided: IV NS w/20 mEq KCL @ 61m/h Monitoring I&O and BMPs  Please exercise your independent, professional judgment when responding. A specific answer is not anticipated or expected.   Thank You,  [phone]

## 2015-04-18 NOTE — Progress Notes (Signed)
Patient ID: Charles Bridges, male   DOB: Aug 17, 1935, 79 y.o.   MRN: [id number] BP 132/47 mmHg  Pulse 65  Temp(Src) 98.9 F (37.2 C) (Oral)  Resp 16  Ht  (1.854 m)  Wt 103.148 kg (227 lb 6.4 oz)  BMI 30.01 kg/m2  SpO2 95% Alert , following all commands 1/5 deltoids, grip, intrinsics. 2/5 biceps, triceps Weak in lower extremities 3/5 hip flexors 3/5 dorsiflexors, plantar flexors Wound is clean, and dry.

## 2015-04-18 NOTE — Clinical Social Work Placement (Signed)
   CLINICAL SOCIAL WORK PLACEMENT  NOTE  Date:  04/18/2015  Patient Details  Name: Charles Bridges MRN: [id number] Date of Birth: 17-Jan-1936  Clinical Social Work is seeking post-discharge placement for this patient at the Skilled  Nursing Facility level of care (*CSW will initial, date and re-position this form in  chart as items are completed):  Yes   Patient/family provided with [facility] Department's list of facilities offering this level of care within the geographic area requested by the patient (or if unable, by the patient's family).  Yes   Patient/family informed of their freedom to choose among providers that offer the needed level of care, that participate in Medicare, Medicaid or managed care program needed by the patient, have an available bed and are willing to accept the patient.  Yes   Patient/family informed of [facility]'s ownership interest in [facility] and [facility], as well as of the fact that they are under no obligation to receive care at these facilities.  PASRR submitted to EDS on 04/18/15     PASRR number received on 04/18/15     Existing PASRR number confirmed on       FL2 transmitted to all facilities in geographic area requested by pt/family on 04/18/15     FL2 transmitted to all facilities within larger geographic area on       Patient informed that his/her managed care company has contracts with or will negotiate with certain facilities, including the following:            Patient/family informed of bed offers received.  Patient chooses bed at       Physician recommends and patient chooses bed at      Patient to be transferred to   on  .  Patient to be transferred to facility by       Patient family notified on   of transfer.  Name of family member notified:        PHYSICIAN       Additional Comment:    _______________________________________________ Gwynne Edinger, LCSW 04/18/2015, 4:48 PM

## 2015-04-18 NOTE — Progress Notes (Signed)
Speech Language Pathology Treatment: Dysphagia  Patient Details Name: Charles Bridges MRN: [id number] DOB: 1936-04-30 Today's Date: 04/18/2015 Time: 7829-5621 SLP Time Calculation (min) (ACUTE ONLY): 19 min  Assessment / Plan / Recommendation Clinical Impression  SLP assisted pt with lunch tray consisting of Dys 3 diet and nectar thick liquids. Immediate throat clear was noted x1 with initial straw sip, with no further signs concerning for airway protection noted. Intake was somewhat limited due to pt discomfort and reports of pain in right shoulder - RN present to administer medication and pt was left to rest. Will continue to follow.   HPI Other Pertinent Information: 79 year old male who fell at home presented to OSH initial Cspine CT clear. Developed upper extremity weakness and was transferred [facility]. CT spine and MRI showed C4-5 fracture with cord edema C3 -6. Taken to OR for decompression. Post op on vent 9/20-9/21.   Pertinent Vitals Pain Assessment: Faces Faces Pain Scale: Hurts even more Pain Location: right shoulder Pain Intervention(s): RN gave pain meds during session  SLP Plan  Continue with current plan of care    Recommendations Diet recommendations: Dysphagia 3 (mechanical soft);Nectar-thick liquid Liquids provided via: Cup;Straw Medication Administration: Whole meds with puree Supervision: Full supervision/cueing for compensatory strategies;Staff to assist with self feeding Compensations: Minimize environmental distractions;Slow rate;Small sips/bites Postural Changes and/or Swallow Maneuvers: Seated upright 90 degrees;Upright 30-60 min after meal       Oral Care Recommendations: Oral care BID Follow up Recommendations:  (tba) Plan: Continue with current plan of care    Maxcine Ham, M.A. CCC-SLP [phone]  Maxcine Ham 04/18/2015, 1:41 PM

## 2015-04-19 LAB — PROTIME-INR
INR: 1.23 (ref 0.00–1.49)
PROTHROMBIN TIME: 15.6 s — AB (ref 11.6–15.2)

## 2015-04-19 MED ORDER — IPRATROPIUM-ALBUTEROL 0.5-2.5 (3) MG/3ML IN SOLN
3.0000 mL | Freq: Two times a day (BID) | RESPIRATORY_TRACT | Status: DC
  Filled 2015-04-19: qty 3

## 2015-04-19 MED ORDER — VITAMIN B-1 100 MG PO TABS
100.0000 mg | ORAL_TABLET | Freq: Every day | ORAL | Status: DC
  Administered 2015-04-20 – 2015-04-22 (×3): 100 mg via ORAL
  Filled 2015-04-19 (×3): qty 1

## 2015-04-19 MED ORDER — OXYCODONE HCL 5 MG PO TABS
10.0000 mg | ORAL_TABLET | ORAL | Status: DC | PRN
  Administered 2015-04-19 – 2015-04-22 (×11): 10 mg via ORAL
  Filled 2015-04-19 (×12): qty 2

## 2015-04-19 MED ORDER — ENOXAPARIN SODIUM 40 MG/0.4ML ~~LOC~~ SOLN
40.0000 mg | SUBCUTANEOUS | Status: DC
  Administered 2015-04-19 – 2015-04-22 (×4): 40 mg via SUBCUTANEOUS
  Filled 2015-04-19 (×4): qty 0.4

## 2015-04-19 MED ORDER — PANTOPRAZOLE SODIUM 40 MG PO TBEC
40.0000 mg | DELAYED_RELEASE_TABLET | Freq: Every day | ORAL | Status: DC
  Administered 2015-04-20 – 2015-04-22 (×3): 40 mg via ORAL
  Filled 2015-04-19 (×3): qty 1

## 2015-04-19 MED ORDER — ALBUTEROL SULFATE (2.5 MG/3ML) 0.083% IN NEBU: 2.5000 mg | INHALATION_SOLUTION | RESPIRATORY_TRACT | Status: DC | PRN

## 2015-04-19 MED ORDER — ASPIRIN 81 MG PO CHEW
81.0000 mg | CHEWABLE_TABLET | Freq: Every day | ORAL | Status: DC
  Administered 2015-04-19 – 2015-04-22 (×4): 81 mg via ORAL
  Filled 2015-04-19 (×4): qty 1

## 2015-04-19 MED ORDER — LORAZEPAM 2 MG/ML IJ SOLN
1.0000 mg | INTRAMUSCULAR | Status: DC | PRN
  Administered 2015-04-20: 1 mg via INTRAVENOUS
  Filled 2015-04-19: qty 1

## 2015-04-19 MED ORDER — HYDROMORPHONE HCL 1 MG/ML IJ SOLN
0.5000 mg | INTRAMUSCULAR | Status: DC | PRN
  Administered 2015-04-19 – 2015-04-20 (×3): 0.5 mg via INTRAVENOUS
  Filled 2015-04-19 (×4): qty 1

## 2015-04-19 NOTE — Progress Notes (Signed)
PATIENT DETAILS Name: Charles Bridges Age: 79 y.o. Sex: male Date of Birth: 10-Aug-1935 Admit Date: 04/13/2015 Admitting Physician Coletta Memos, MD JXB:[id number],AOZHY L, MD  Brief narrative:  79 year old male with history of prior CVA,on Coumadin, history of carotid artery occlusion status post CEA, EtOH use who fell at home and presented to OSH.Initial Cspine CT clear. Developed upper extremity weakness and was transferred to [facility]. CT spine and MRI showed C4-5 fracture with cord edema C3 -6. Taken to OR for decompression. Post op was placed on a ventilator and managed in the intensive care unit. Extubated on 9/21, and transferred to the [facility] on 9/23.  Subjective: Appeared confused this am-but with further conversation-easily redirected and making more sense.Still complains of pain in neck and right shoulder  Assessment/Plan: Principal Problem: Fracture of anterior osteophyte at C4-C5 level with Severe C4-5 spinal stenosis and cord compression: Underwent decompression on 9/20. Still with significant quadriparesis-bilateral upper extremity around 2/5, bilateral lower extremity around 3-4/5. Neurosurgery following, PT evaluation recommending SNF  Active Problems: Acute respiratory failure in post operative setting: Extubated on 9/21. Currently doing well on room air.  Suspected aspiration pneumonia: Will dicontinue Zosyn after today's dose-as has completed 7 days of therapy.Remains afebrile, and without leukocytosis.  Acute encephalopathy: Suspect multifactorial etiology-EtOH withdrawal/narcotics/pneumonia all contributing. Continue supportive care.  QMV:HQIONG pre-renal azotemia, resolved with supportive care  Essential hypertension: Controlled, continue lisinopril, HCTZ and metoprolol. Follow and titrate accordingly  HLD (hyperlipidemia): Continue with statin.  Left 11th Rib fracture: Incentive spirometry, supportive care.  Chronic Coumadin use:?  Reason-family not aware of diagnoses of A. fib or DVT/PE.Spoke with son/spouse on 9/24-given numerous recent falls recently-they are agreeable to continue to hold coumadin. They are aware of risk of CVA. Will start ASA. Awaiting records from PCPs office.  Anemia: Likely secondary to acute/critical illness. Follow. No indication for transfusion.  History of CVA/carotid artery disease-status post carotid endarterectomy: Nonfocal exam, appears to be stable. Will resume aspirin in the next few days.  History of peptic ulcer disease: Continue with PPI, is s/p vagotomy.  Hyponatremia: Resolved. Most likely secondary to beer potomania  Disposition: Remain inpatient-SNF early next week  Antimicrobial agents  See below  Anti-infectives    Start     Dose/Rate Route Frequency Ordered Stop   04/14/15 0800  vancomycin (VANCOCIN) IVPB 1000 mg/200 mL premix  Status:  Discontinued     1,000 mg 200 mL/hr over 60 Minutes Intravenous Every 12 hours 04/13/15 1855 04/15/15 0928   04/13/15 1930  piperacillin-tazobactam (ZOSYN) IVPB 3.375 g     3.375 g 12.5 mL/hr over 240 Minutes Intravenous 3 times per day 04/13/15 1855     04/13/15 1900  vancomycin (VANCOCIN) 1,750 mg in sodium chloride 0.9 % 500 mL IVPB     1,750 mg 250 mL/hr over 120 Minutes Intravenous  Once 04/13/15 1855 04/13/15 2255      DVT Prophylaxis: SCD's  Code Status: Full code  Family Communication None at bedside this am  Procedures: 9/20 to OR for decompression  CONSULTS:  pulmonary/intensive care and Neurosurgery  Time spent 30 minutes-Greater than 50% of this time was spent in counseling, explanation of diagnosis, planning of further management, and coordination of care.  MEDICATIONS: Scheduled Meds: . folic acid  1 mg Oral Daily  . lisinopril  20 mg Oral Daily   And  . hydrochlorothiazide  12.5 mg Oral Daily  . latanoprost  1 drop Right Eye QHS  . metoprolol tartrate  50 mg Oral BID  . multivitamin with minerals   1 tablet Oral Daily  . pantoprazole (PROTONIX) IV  40 mg Intravenous Daily  . piperacillin-tazobactam (ZOSYN)  IV  3.375 g Intravenous 3 times per day  . polyethylene glycol  17 g Oral BID  . pravastatin  40 mg Oral q1800  . senna  1 tablet Oral BID  . thiamine IV  100 mg Intravenous Daily   Continuous Infusions:   PRN Meds:.acetaminophen **OR** acetaminophen, albuterol, bisacodyl, diazepam, hydrALAZINE, HYDROmorphone (DILAUDID) injection, magnesium citrate, menthol-cetylpyridinium **OR** phenol, ondansetron (ZOFRAN) IV, oxyCODONE, senna-docusate    PHYSICAL EXAM: Vital signs in last 24 hours: Filed Vitals:   04/18/15 0943 04/18/15 2318 04/19/15 0215 04/19/15 0713  BP: 132/47 151/52 145/94 152/54  Pulse: 65 67 64 65  Temp: 98.9 F (37.2 C) 97.7 F (36.5 C) 98.6 F (37 C) 98.1 F (36.7 C)  TempSrc: Oral Oral Oral Oral  Resp: Height:      Weight:      SpO2: 95% 92% 97% 95%    Weight change:  Filed Weights   04/13/15 1630  Weight: 103.148 kg (227 lb 6.4 oz)   Body mass index is 30.01 kg/(m^2).   Gen Exam: Awake, not in any distress. Somewhat confused but answers some questions appropriately. Neck: Cervical collar in place Chest: Clear to auscultation anteriorly CVS: S1 S2 Regular Abdomen: soft, BS +, non tender, non distended.  Extremities: no edema, lower extremities warm to touch. Neurologic: See above Skin: No Rash.   Wounds: N/A.   Intake/Output from previous day:  Intake/Output Summary (Last 24 hours) at 04/19/15 0912 Last data filed at 04/19/15 0715  Gross per 24 hour  Intake      0 ml  Output   1750 ml  Net  -1750 ml     LAB RESULTS: CBC  Recent Labs Lab 04/13/15 1842 04/15/15 0555 04/17/15 0300 04/18/15 0515  WBC 9.7 7.9 8.5 6.6  HGB 12.8* 11.0* 9.7* 9.4*  HCT 35.4* 30.1* 28.6* 27.1*  PLT 160 140* 150 160  MCV 92.2 93.2 97.3 99.3  MCH 33.3 34.1* 33.0 34.4*  MCHC 36.2* 36.5* 33.9 34.7  RDW 13.3 13.6 14.3 15.2     Chemistries   Recent Labs Lab 04/15/15 0555 04/15/15 2243 04/16/15 0229 04/17/15 0300 04/18/15 0515  NA 129* 132* 131* 135 138  K 3.9 4.9 4.2 4.3 3.8  CL 100* 100* 98* 101 107  CO2 21* GLUCOSE 135* 165* 163* 120* 101*  BUN 19 23* 23* 20 25*  CREATININE 1.12 1.30* 1.25* 1.04 1.12  CALCIUM 8.4* 8.3* 8.2* 8.1* 8.2*  MG  --   --   --  2.3  --     CBG:  Recent Labs Lab 04/16/15 1558 04/16/15 1937 04/16/15 2315 04/17/15 0321 04/17/15 0821  GLUCAP 124* 114* 102* 117* 107*    GFR Estimated Creatinine Clearance: 69.7 mL/min (by C-G formula based on Cr of 1.12).  Coagulation profile  Recent Labs Lab 04/15/15 0555 04/16/15 0229 04/17/15 0300 04/18/15 0515 04/19/15 0700  INR 1.47 1.52* 1.26 1.27 1.23    Cardiac Enzymes No results for input(s): CKMB, TROPONINI, MYOGLOBIN in the last 168 hours.  Invalid input(s): CK  Invalid input(s): POCBNP No results for input(s): DDIMER in the last 72 hours. No results for input(s): HGBA1C in the last 72 hours. No results for input(s): CHOL, HDL, LDLCALC,  TRIG, CHOLHDL, LDLDIRECT in the last 72 hours. No results for input(s): TSH, T4TOTAL, T3FREE, THYROIDAB in the last 72 hours.  Invalid input(s): FREET3 No results for input(s): VITAMINB12, FOLATE, FERRITIN, TIBC, IRON, RETICCTPCT in the last 72 hours. No results for input(s): LIPASE, AMYLASE in the last 72 hours.  Urine Studies No results for input(s): UHGB, CRYS in the last 72 hours.  Invalid input(s): UACOL, UAPR, USPG, UPH, UTP, UGL, UKET, UBIL, UNIT, UROB, ULEU, UEPI, UWBC, URBC, UBAC, CAST, UCOM, BILUA  MICROBIOLOGY: Recent Results (from the past 240 hour(s))  Surgical pcr screen     Status: Abnormal   Collection Time: 04/15/15  7:55 AM  Result Value Ref Range Status   MRSA, PCR NEGATIVE NEGATIVE Final   Staphylococcus aureus POSITIVE (A) NEGATIVE Final    Comment:        The Xpert SA Assay (FDA approved for NASAL specimens in patients over 110  years of age), is one component of a comprehensive surveillance program.  Test performance has been validated by [facility] for patients greater than or equal to 72 year old. It is not intended to diagnose infection nor to guide or monitor treatment.     RADIOLOGY STUDIES/RESULTS: Ct Cervical Spine Wo Contrast  04/14/2015   CLINICAL DATA:  79 year old male post trauma. Weakness. Subsequent encounter.  EXAM: CT CERVICAL SPINE WITHOUT CONTRAST  TECHNIQUE: Multidetector CT imaging of the cervical spine was performed without intravenous contrast. Multiplanar CT image reconstructions were also generated.  COMPARISON:  04/14/2015 cervical spine MR.  FINDINGS: Flowing confluent osteophyte C6 through T1 with fusion across the disc spaces.  Prominent anterior osteophyte C5-6 with areas of fusion of the anterior osteophyte.  On the accompanying motion degraded MR, there is prevertebral edema/hemorrhage. Edema extends across the C4-5 anterior longitudinal ligament and disc space consistent with soft tissue injury. Possible mild edema right C4-5 interlaminar ligament. Fracture of the anterior osteophyte at the C4-5 level (although the borders appear fairly well defined) may have recurrent with hyperextension.  Fracture of the anterior C3-4 osteophyte suspected.  With the patient in a cervical collar, there is relatively normal alignment of the cervical spine.  Prominent cervical spondylotic changes with various degrees of spinal stenosis and foraminal narrowing most prominent C3-4 thru C5-6 where there is cord compression or cord edema extending from C3 through upper C6 on recent MR.  Patient will be scheduled for repeat MR.  IMPRESSION: On the accompanying motion degraded MR, there is prevertebral edema/hemorrhage. Edema extends across the C4-5 anterior longitudinal ligament and disc space consistent with soft tissue injury. Possible mild edema right C4-5 interlaminar ligament. Fracture of the anterior  osteophyte at the C4-5 level (although the borders appear fairly well defined) may have recurrent with hyperextension.  Fracture of the anterior C3-4 osteophyte suspected.  Prominent cervical spondylotic changes with various degrees of spinal stenosis and foraminal narrowing most prominent C3-4 thru C5-6 where there is cord compression or cord edema extending from C3 through upper C6 on recent MR.  These results were reviewed with Dr. Franky Macho 04/14/2015  at 8:29 pm.   Electronically Signed   By: Lacy Duverney M.D.   On: 04/14/2015 20:55   Mr Brain Wo Contrast  04/14/2015   CLINICAL DATA:  Recent fall, worsening of RIGHT arm paresthesias and weakness. History of carotid artery occlusion, hypertension, diabetes, stroke.  EXAM: MRI HEAD WITHOUT CONTRAST  TECHNIQUE: Multiplanar, multiecho pulse sequences of the brain and surrounding structures were obtained without intravenous contrast.  COMPARISON:  None.  FINDINGS: Multiple sequences are mild to moderately motion degraded.  No reduced diffusion to suggest acute ischemia. No susceptibility artifact to suggest hemorrhage. Moderate to severe ventriculomegaly with proportional enlargement of cerebral sulci and cerebellar folia. Old RIGHT basal ganglia and bilateral thalamus lacunar infarcts. No midline shift, mass effect or mass lesions. RIGHT cerebral peduncle volume loss compatible with wallerian degeneration. A few scattered subcentimeter supratentorial white matter T2 hyperintensities, and T2 hyperintense signal within the pons consistent with mild chronic small vessel ischemic disease. No midline shift, mass effect or mass lesions.  No abnormal extra-axial fluid collections. Normal major intracranial vascular flow voids seen at the skull base.  Status post bilateral ocular lens implants. Moderate paranasal sinus mucosal thickening and probably inspissated mucus RIGHT maxillary sinus. The mastoid air cells are well aerated. No abnormal sellar expansion. No  cerebellar tonsillar ectopia. No suspicious calvarial bone marrow signal. Patient is edentulous.  IMPRESSION: No acute intracranial process on this motion degraded examination.  Old RIGHT basal ganglia and bilateral thalamus infarcts.  Moderate to severe global brain atrophy. Mild chronic small vessel ischemic disease.   Electronically Signed   By: Awilda Metro M.D.   On: 04/14/2015 01:44   Mr Cervical Spine Wo Contrast  04/15/2015   CLINICAL DATA:  79 year old post trauma. Weakness. Subsequent encounter.  EXAM: MRI CERVICAL SPINE WITHOUT CONTRAST  TECHNIQUE: Multiplanar, multisequence MR imaging of the cervical spine was performed. No intravenous contrast was administered.  COMPARISON:  04/14/2015 CT and MR.  FINDINGS: Exam was attempted without sedation and revealed increase in the degree of retropharyngeal edema/ hemorrhage. Axial images remain motion degraded. Preliminary results discussed with Dr. Franky Macho and it was elected to perform examination with anesthesia/moderate sedation.  On repeat imaging with sedation, retropharyngeal edema/hemorrhage is once again noted located anterior to the longus coli muscles extending from the clivus to the upper C5 level and maximal at the C2 through C4 level.  Injury through the C4-5 anterior longitudinal ligament, C4-5 disc space extending to involve the C4-5 facet joints and posterior paraspinal musculature consistent with extension injury. Buckling of the posterior ligaments which appear edematous. Short pedicles. Broad-based disc osteophyte complex. Facet joint bony overgrowth. Uncinate hypertrophy. Severe C4-5 spinal stenosis and cord compression greater on the left (cord flattening to 3 mm). Marked bilateral foraminal narrowing.  Spur posterior C5 vertebral body greater on left with severe cord compression greater on the left.  C5-6 broad-based disc osteophyte complex with slight cephalad extension. Moderate to marked left-sided and moderate right-sided spinal  stenosis. Marked bilateral foraminal narrowing.  Edema within the cord extends from the C3 to the C6 level maximal at the C4 and C5 level. Gradient sequence without focal cord hemorrhage.  C2-3: Shallow broad-based disc osteophyte complex greater to right. Mild narrowing ventral aspect of the thecal sac without cord compression.  C3-4: No edema involving the C3-4 anterior osteophyte to suggest acute fracture as questioned on recent CT. Broad-based disc osteophyte complex greater to left. Moderate spinal stenosis greater on left. Facet joint degenerative changes. Marked left-sided and moderate right-sided foraminal narrowing.  Flowing osteophyte with fusion C6 through T2.  C6-7: Bulge and osteophyte with mild narrowing ventral aspect of the thecal sac greater on the left. Mild bilateral foraminal narrowing.  C7-T1: Uncinate hypertrophy. Mild to moderate foraminal narrowing bilaterally.  Small amount of fluid within the C1 occipital condyle articulation bilaterally and C1-C2 lateral mass joint bilaterally without surrounding bone edema or soft tissue edema (i.e., separate from the retropharyngeal edema) to suggest significant  osseous or ligamentous injury.  IMPRESSION: Interval increase in retropharyngeal edema/hemorrhage maximal at the C2 through C4 level.  Injury through the C4-5 anterior longitudinal ligament, C4-5 disc space extending to involve the C4-5 facet joints and posterior paraspinal musculature consistent with extension injury. Buckling of the posterior ligaments which appear edematous. Short pedicles. Broad-based disc osteophyte complex. Facet joint bony overgrowth. Uncinate hypertrophy. Severe C4-5 spinal stenosis and cord compression greater on the left (cord flattening to 3 mm). Marked bilateral foraminal narrowing.  Spur posterior C5 vertebral body greater on left with severe cord compression greater on the left.  C5-6 broad-based disc osteophyte complex with slight cephalad extension. Moderate to  marked left-sided and moderate right-sided spinal stenosis. Marked bilateral foraminal narrowing.  Edema within the cord extends from the C3 to the C6 level maximal at the C4 and C5 level. Gradient sequence without focal cord hemorrhage.  C2-3 shallow broad-based disc osteophyte complex greater to right. Mild narrowing ventral aspect of the thecal sac without cord compression.  C3-4 broad-based disc osteophyte complex greater to left. Moderate spinal stenosis greater on left. Marked left-sided and moderate right-sided foraminal narrowing. No edema involving the C3-4 anterior osteophyte to suggest acute fracture as questioned on recent CT.  Flowing osteophyte with fusion C6 through T2.  C6-7 bulge and osteophyte with mild narrowing ventral aspect of the thecal sac greater on the left. Mild bilateral foraminal narrowing.  C7-T1 mild to moderate foraminal narrowing bilaterally.  Small amount of fluid within the C1 occipital condyle articulation bilaterally and C1-C2 lateral mass joint bilaterally without surrounding bone edema or soft tissue edema (i.e., separate from the retropharyngeal edema) to suggest significant osseous or ligamentous injury.  These results were called by telephone at the time of interpretation on 04/15/2015 at 1:44 pm to Dr. Coletta Memos , who verbally acknowledged these results.   Electronically Signed   By: Lacy Duverney M.D.   On: 04/15/2015 14:24   Mr Cervical Spine Wo Contrast  04/14/2015   ADDENDUM REPORT: 04/14/2015 11:02 ADDENDUM: Original report by Dr. Karie Kirks. Addendum by Dr. Mosetta Putt: Abnormal T2 hyperintensity within the spinal cord is most consistent with edema, with myelomalacia a less likely consideration. Study discussed in person with Dr. Franky Macho on 04/14/2015 at 10 a.m. Electronically Signed   By: Sebastian Ache M.D.   On: 04/14/2015 11:02  04/14/2015   CLINICAL DATA:  Worsening RIGHT arm paresthesias after recent fall. History of carotid artery occlusion, hypertension, diabetes  and stroke.  EXAM: MRI CERVICAL SPINE WITHOUT CONTRAST  TECHNIQUE: Multiplanar, multisequence MR imaging of the cervical spine was performed. No intravenous contrast was administered.  COMPARISON:  None.  FINDINGS: Motion degraded examination, per technologist note, patient had difficulty remaining still even with sedation. Axial T2 and T2 gradient sequences nearly nondiagnostic, severely degraded sagittal STIR.  Cervical vertebral bodies and posterior elements appear intact and aligned with maintenance of cervical lordosis. However, there is mild widening of the C4-5 disc space ventrally, associated with bright STIR signal concerning for fracture, likely throughout osteophyte. Severe C5-6 disc height loss. Bridging bone marrow signal C6-7 and T1-2 consistent with auto interbody arthrodesis. No malalignment. Maintenance of cervical lordosis. Moderate to severe chronic discogenic endplate changes C4-5, C5-6. Disc desiccation C2-3 through C5-6 and, C7-T1.  Bright T2 signal within the spinal cord from C3 - C5-6, most consistent with cord edema or pre syrinx without frank syrinx. Craniocervical junction intact. Probable disruption of the anterior longitudinal ligament which is likely calcified at C4-5. Small apparent retropharyngeal versus prevertebral  effusion, difficult to evaluate due to patient motion.  Severely limited axial gradient sequences. 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 Superimposed degenerative change resulting in severe canal stenosis C3-4 thru C5-6, corroborated by sagittal imaging, AP dimension the canal is approximately 6 mm. Probable neural foraminal narrowing midcervical spine, limited assessment.  IMPRESSION: Motion degraded examination, nearly nondiagnostic axial sequences.  Acute fracture through the C4-5 anterior disc space which is likely calcified, including suspected focal disruption of the anterior longitudinal ligament, concerning for  hyperextension injury. Probable prevertebral versus retropharyngeal effusion.  Spinal cord edema/pre syrinx C3 through C5-6 without frank syrinx.  Degenerative change of the cervical spine superimposed on congenital canal stenosis. Severe canal stenosis C3-4 through C5-6. Probable neural foraminal narrowing.  Recommend CT cervical spine to assess bony changes.  Electronically Signed: By: Awilda Metro M.D. On: 04/14/2015 02:33   Dg Chest Port 1 View  04/17/2015   CLINICAL DATA:  Shortness of breath.  EXAM: PORTABLE CHEST - 1 VIEW  COMPARISON:  04/15/2015 .  FINDINGS: Interim extubation. Stable cardiomegaly. Persistent bibasilar atelectasis and/or interstitial infiltrates. Basilar interstitial edema cannot be excluded. Small left pleural effusion . No pneumothorax. No acute bony abnormality .  IMPRESSION: 1. Interim extubation.  2.  Persistent cardiomegaly.  3. Persistent bibasilar atelectasis and interstitial prominence. Bibasilar pneumonia and or interstitial edema cannot be excluded. Persistent small left pleural effusion .   Electronically Signed   By: Maisie Fus  Register   On: 04/17/2015 08:08   Portable Chest Xray  04/15/2015   CLINICAL DATA:  79 year old male status post intubation.  EXAM: PORTABLE CHEST - 1 VIEW  COMPARISON:  Radiograph dated 04/13/2015 and CT dated 04/13/2015.  FINDINGS: An endotracheal tube is seen with tip approximately 5.5 cm above the carina. There is a small left pleural effusion. Bibasilar atelectatic changes again noted. Superimposed pneumonia is not excluded. There is no pneumothorax. Stable cardiac silhouette. The osseous structures appear grossly unremarkable. Right humeral head fixation pin.  IMPRESSION: Endotracheal tube above the carina.   Electronically Signed   By: Elgie Collard M.D.   On: 04/15/2015 23:22   Dg C-arm 1-60 Min-no Report  04/15/2015   CLINICAL DATA: for posterior cervical fusion,   C-ARM 1-60 MINUTES  Fluoroscopy was utilized by the requesting  physician.  No radiographic  interpretation.     Jeoffrey Massed, MD  [facility] Pager:[phone]  If 7PM-7AM, please contact night-coverage [email or web address] Password [email or web address] 04/19/2015, 9:12 AM   LOS: 6 days

## 2015-04-19 NOTE — Progress Notes (Signed)
Patient ID: Charles Bridges, male   DOB: 08-23-1935, 79 y.o.   MRN: [id number] Patient confused felt like it was raining in the room fell acute been transferred to this room for another building that was as he described "a torture chamber"  Significant upper extremity weakness consistent with a central cord syndrome and myelopathy. Lower sherry strength seems to be 4+ out of 5. Neurologic exam appears to be stable.  Continue physical occupational therapy maintains on the withdrawal protocols confusion certainly could be a sequela of withdrawal.

## 2015-04-20 ENCOUNTER — Inpatient Hospital Stay (HOSPITAL_COMMUNITY): Payer: Medicare Other

## 2015-04-20 NOTE — Progress Notes (Addendum)
PATIENT DETAILS Name: Charles Bridges Age: 79 y.o. Sex: male Date of Birth: 03-18-1936 Admit Date: 04/13/2015 Admitting Physician Coletta Memos, MD OZH:[id number],ONGEX L, MD  Brief narrative:  79 year old male with history of prior CVA,on Coumadin, history of carotid artery occlusion status post CEA, EtOH use who fell at home and presented to OSH.Initial Cspine CT clear. Developed upper extremity weakness and was transferred to [facility]. CT spine and MRI showed C4-5 fracture with cord edema C3 -6. Taken to OR for decompression. Post op was placed on a ventilator and managed in the intensive care unit. Extubated on 9/21, and transferred to the [facility] on 9/23.  Subjective: Confused this am-but with further conversation-easily redirected and makes more sense.   Assessment/Plan: Principal Problem: Fracture of anterior osteophyte at C4-C5 level with Severe C4-5 spinal stenosis and cord compression: Underwent decompression on 9/20. Still with significant quadriparesis-bilateral upper extremity around 2/5, bilateral lower extremity around 3-4/5. Neurosurgery following, PT evaluation recommending SNF  Active Problems: Acute respiratory failure in post operative setting: Extubated on 9/21. Currently doing well on room air.  Suspected aspiration pneumonia: Completed 7 days of Zosyn on 9/24.Remains afebrile, and without leukocytosis.  Acute encephalopathy: Suspect multifactorial etiology-EtOH withdrawal/narcotics/pneumonia all contributing. Continue supportive care-stop IV Dilaudid-and minimize narcotics as much as possible.Pleasantly confused-easily redirectable.  BMW:UXLKGM pre-renal azotemia, resolved with supportive care  Essential hypertension: Controlled, continue lisinopril, HCTZ and metoprolol. Follow and titrate accordingly  HLD (hyperlipidemia): Continue with statin.  Left 11th Rib fracture: Incentive spirometry, supportive care.  Chronic Coumadin use:?  Reason-family not aware of diagnoses of A. fib or DVT/PE.Spoke with son/spouse on 9/24-given numerous recent falls recently-they are agreeable to continue to hold coumadin. They are aware of risk of CVA. Will start ASA. Awaiting records from PCPs office.  Anemia: Likely secondary to acute/critical illness. Follow. No indication for transfusion.  History of CVA/carotid artery disease-status post carotid endarterectomy: Nonfocal exam, appears to be stable. Will resume aspirin in the next few days.  History of peptic ulcer disease: Continue with PPI, is s/p vagotomy.  Hyponatremia: Resolved. Most likely secondary to beer potomania  Disposition: Remain inpatient-SNF in next next 1-2 days  Antimicrobial agents  See below  Anti-infectives    Start     Dose/Rate Route Frequency Ordered Stop   04/14/15 0800  vancomycin (VANCOCIN) IVPB 1000 mg/200 mL premix  Status:  Discontinued     1,000 mg 200 mL/hr over 60 Minutes Intravenous Every 12 hours 04/13/15 1855 04/15/15 0928   04/13/15 1930  piperacillin-tazobactam (ZOSYN) IVPB 3.375 g     3.375 g 12.5 mL/hr over 240 Minutes Intravenous 3 times per day 04/13/15 1855 04/20/15 0559   04/13/15 1900  vancomycin (VANCOCIN) 1,750 mg in sodium chloride 0.9 % 500 mL IVPB     1,750 mg 250 mL/hr over 120 Minutes Intravenous  Once 04/13/15 1855 04/13/15 2255      DVT Prophylaxis: SCD's  Code Status: Full code  Family Communication None at bedside this am  Procedures: 9/20 OR for decompression  CONSULTS:  pulmonary/intensive care and Neurosurgery  Time spent 30 minutes-Greater than 50% of this time was spent in counseling, explanation of diagnosis, planning of further management, and coordination of care.  MEDICATIONS: Scheduled Meds: . aspirin  81 mg Oral Daily  . enoxaparin (LOVENOX) injection  40 mg Subcutaneous Q24H  . folic acid  1 mg Oral Daily  . lisinopril  20 mg Oral  Daily   And  . hydrochlorothiazide  12.5 mg Oral Daily  .  latanoprost  1 drop Right Eye QHS  . metoprolol tartrate  50 mg Oral BID  . multivitamin with minerals  1 tablet Oral Daily  . pantoprazole  40 mg Oral Daily  . polyethylene glycol  17 g Oral BID  . pravastatin  40 mg Oral q1800  . senna  1 tablet Oral BID  . thiamine  100 mg Oral Daily   Continuous Infusions:   PRN Meds:.acetaminophen **OR** acetaminophen, albuterol, bisacodyl, hydrALAZINE, LORazepam, magnesium citrate, menthol-cetylpyridinium **OR** phenol, ondansetron (ZOFRAN) IV, oxyCODONE, senna-docusate    PHYSICAL EXAM: Vital signs in last 24 hours: Filed Vitals:   04/20/15 0000 04/20/15 0133 04/20/15 0520 04/20/15 0949  BP: 155/60 156/50 158/54 140/48  Pulse: 72 75 68 63  Temp:  98.6 F (37 C) 98 F (36.7 C) 97.7 F (36.5 C)  TempSrc:  Oral Oral Oral  Resp:  17 18 16   Height:      Weight:      SpO2:  95% 94% 93%    Weight change:  Filed Weights   04/13/15 1630  Weight: 103.148 kg (227 lb 6.4 oz)   Body mass index is 30.01 kg/(m^2).   Gen Exam: Awake, not in any distress. Somewhat confused but answers some questions appropriately. Neck: Cervical collar in place Chest: Clear to auscultation anteriorly CVS: S1 S2 Regular Abdomen: soft, BS +, non tender, non distended.  Extremities: no edema, lower extremities warm to touch. Neurologic: See above Skin: No Rash.   Wounds: N/A.   Intake/Output from previous day:  Intake/Output Summary (Last 24 hours) at 04/20/15 1149 Last data filed at 04/20/15 0528  Gross per 24 hour  Intake      0 ml  Output   1750 ml  Net  -1750 ml     LAB RESULTS: CBC  Recent Labs Lab 04/13/15 1842 04/15/15 0555 04/17/15 0300 04/18/15 0515  WBC 9.7 7.9 8.5 6.6  HGB 12.8* 11.0* 9.7* 9.4*  HCT 35.4* 30.1* 28.6* 27.1*  PLT 160 140* 150 160  MCV 92.2 93.2 97.3 99.3  MCH 33.3 34.1* 33.0 34.4*  MCHC 36.2* 36.5* 33.9 34.7  RDW 13.3 13.6 14.3 15.2    Chemistries   Recent Labs Lab 04/15/15 0555 04/15/15 2243  04/16/15 0229 04/17/15 0300 04/18/15 0515  NA 129* 132* 131* 135 138  K 3.9 4.9 4.2 4.3 3.8  CL 100* 100* 98* 101 107  CO2 21* 25 25 27 22   GLUCOSE 135* 165* 163* 120* 101*  BUN 19 23* 23* 20 25*  CREATININE 1.12 1.30* 1.25* 1.04 1.12  CALCIUM 8.4* 8.3* 8.2* 8.1* 8.2*  MG  --   --   --  2.3  --     CBG:  Recent Labs Lab 04/16/15 1558 04/16/15 1937 04/16/15 2315 04/17/15 0321 04/17/15 0821  GLUCAP 124* 114* 102* 117* 107*    GFR Estimated Creatinine Clearance: 69.7 mL/min (by C-G formula based on Cr of 1.12).  Coagulation profile  Recent Labs Lab 04/15/15 0555 04/16/15 0229 04/17/15 0300 04/18/15 0515 04/19/15 0700  INR 1.47 1.52* 1.26 1.27 1.23    Cardiac Enzymes No results for input(s): CKMB, TROPONINI, MYOGLOBIN in the last 168 hours.  Invalid input(s): CK  Invalid input(s): POCBNP No results for input(s): DDIMER in the last 72 hours. No results for input(s): HGBA1C in the last 72 hours. No results for input(s): CHOL, HDL, LDLCALC, TRIG, CHOLHDL, LDLDIRECT in the last 72  hours. No results for input(s): TSH, T4TOTAL, T3FREE, THYROIDAB in the last 72 hours.  Invalid input(s): FREET3 No results for input(s): VITAMINB12, FOLATE, FERRITIN, TIBC, IRON, RETICCTPCT in the last 72 hours. No results for input(s): LIPASE, AMYLASE in the last 72 hours.  Urine Studies No results for input(s): UHGB, CRYS in the last 72 hours.  Invalid input(s): UACOL, UAPR, USPG, UPH, UTP, UGL, UKET, UBIL, UNIT, UROB, ULEU, UEPI, UWBC, URBC, UBAC, CAST, UCOM, BILUA  MICROBIOLOGY: Recent Results (from the past 240 hour(s))  Surgical pcr screen     Status: Abnormal   Collection Time: 04/15/15  7:55 AM  Result Value Ref Range Status   MRSA, PCR NEGATIVE NEGATIVE Final   Staphylococcus aureus POSITIVE (A) NEGATIVE Final    Comment:        The Xpert SA Assay (FDA approved for NASAL specimens in patients over 71 years of age), is one component of a comprehensive  surveillance program.  Test performance has been validated by [facility] for patients greater than or equal to 2 year old. It is not intended to diagnose infection nor to guide or monitor treatment.     RADIOLOGY STUDIES/RESULTS: Ct Cervical Spine Wo Contrast  04/14/2015   CLINICAL DATA:  79 year old male post trauma. Weakness. Subsequent encounter.  EXAM: CT CERVICAL SPINE WITHOUT CONTRAST  TECHNIQUE: Multidetector CT imaging of the cervical spine was performed without intravenous contrast. Multiplanar CT image reconstructions were also generated.  COMPARISON:  04/14/2015 cervical spine MR.  FINDINGS: Flowing confluent osteophyte C6 through T1 with fusion across the disc spaces.  Prominent anterior osteophyte C5-6 with areas of fusion of the anterior osteophyte.  On the accompanying motion degraded MR, there is prevertebral edema/hemorrhage. Edema extends across the C4-5 anterior longitudinal ligament and disc space consistent with soft tissue injury. Possible mild edema right C4-5 interlaminar ligament. Fracture of the anterior osteophyte at the C4-5 level (although the borders appear fairly well defined) may have recurrent with hyperextension.  Fracture of the anterior C3-4 osteophyte suspected.  With the patient in a cervical collar, there is relatively normal alignment of the cervical spine.  Prominent cervical spondylotic changes with various degrees of spinal stenosis and foraminal narrowing most prominent C3-4 thru C5-6 where there is cord compression or cord edema extending from C3 through upper C6 on recent MR.  Patient will be scheduled for repeat MR.  IMPRESSION: On the accompanying motion degraded MR, there is prevertebral edema/hemorrhage. Edema extends across the C4-5 anterior longitudinal ligament and disc space consistent with soft tissue injury. Possible mild edema right C4-5 interlaminar ligament. Fracture of the anterior osteophyte at the C4-5 level (although the borders appear  fairly well defined) may have recurrent with hyperextension.  Fracture of the anterior C3-4 osteophyte suspected.  Prominent cervical spondylotic changes with various degrees of spinal stenosis and foraminal narrowing most prominent C3-4 thru C5-6 where there is cord compression or cord edema extending from C3 through upper C6 on recent MR.  These results were reviewed with Dr. Franky Macho 04/14/2015  at 8:29 pm.   Electronically Signed   By: Lacy Duverney M.D.   On: 04/14/2015 20:55   Mr Brain Wo Contrast  04/14/2015   CLINICAL DATA:  Recent fall, worsening of RIGHT arm paresthesias and weakness. History of carotid artery occlusion, hypertension, diabetes, stroke.  EXAM: MRI HEAD WITHOUT CONTRAST  TECHNIQUE: Multiplanar, multiecho pulse sequences of the brain and surrounding structures were obtained without intravenous contrast.  COMPARISON:  None.  FINDINGS: Multiple sequences are mild to  moderately motion degraded.  No reduced diffusion to suggest acute ischemia. No susceptibility artifact to suggest hemorrhage. Moderate to severe ventriculomegaly with proportional enlargement of cerebral sulci and cerebellar folia. Old RIGHT basal ganglia and bilateral thalamus lacunar infarcts. No midline shift, mass effect or mass lesions. RIGHT cerebral peduncle volume loss compatible with wallerian degeneration. A few scattered subcentimeter supratentorial white matter T2 hyperintensities, and T2 hyperintense signal within the pons consistent with mild chronic small vessel ischemic disease. No midline shift, mass effect or mass lesions.  No abnormal extra-axial fluid collections. Normal major intracranial vascular flow voids seen at the skull base.  Status post bilateral ocular lens implants. Moderate paranasal sinus mucosal thickening and probably inspissated mucus RIGHT maxillary sinus. The mastoid air cells are well aerated. No abnormal sellar expansion. No cerebellar tonsillar ectopia. No suspicious calvarial bone marrow  signal. Patient is edentulous.  IMPRESSION: No acute intracranial process on this motion degraded examination.  Old RIGHT basal ganglia and bilateral thalamus infarcts.  Moderate to severe global brain atrophy. Mild chronic small vessel ischemic disease.   Electronically Signed   By: Awilda Metro M.D.   On: 04/14/2015 01:44   Mr Cervical Spine Wo Contrast  04/15/2015   CLINICAL DATA:  79 year old post trauma. Weakness. Subsequent encounter.  EXAM: MRI CERVICAL SPINE WITHOUT CONTRAST  TECHNIQUE: Multiplanar, multisequence MR imaging of the cervical spine was performed. No intravenous contrast was administered.  COMPARISON:  04/14/2015 CT and MR.  FINDINGS: Exam was attempted without sedation and revealed increase in the degree of retropharyngeal edema/ hemorrhage. Axial images remain motion degraded. Preliminary results discussed with Dr. Franky Macho and it was elected to perform examination with anesthesia/moderate sedation.  On repeat imaging with sedation, retropharyngeal edema/hemorrhage is once again noted located anterior to the longus coli muscles extending from the clivus to the upper C5 level and maximal at the C2 through C4 level.  Injury through the C4-5 anterior longitudinal ligament, C4-5 disc space extending to involve the C4-5 facet joints and posterior paraspinal musculature consistent with extension injury. Buckling of the posterior ligaments which appear edematous. Short pedicles. Broad-based disc osteophyte complex. Facet joint bony overgrowth. Uncinate hypertrophy. Severe C4-5 spinal stenosis and cord compression greater on the left (cord flattening to 3 mm). Marked bilateral foraminal narrowing.  Spur posterior C5 vertebral body greater on left with severe cord compression greater on the left.  C5-6 broad-based disc osteophyte complex with slight cephalad extension. Moderate to marked left-sided and moderate right-sided spinal stenosis. Marked bilateral foraminal narrowing.  Edema within the  cord extends from the C3 to the C6 level maximal at the C4 and C5 level. Gradient sequence without focal cord hemorrhage.  C2-3: Shallow broad-based disc osteophyte complex greater to right. Mild narrowing ventral aspect of the thecal sac without cord compression.  C3-4: No edema involving the C3-4 anterior osteophyte to suggest acute fracture as questioned on recent CT. Broad-based disc osteophyte complex greater to left. Moderate spinal stenosis greater on left. Facet joint degenerative changes. Marked left-sided and moderate right-sided foraminal narrowing.  Flowing osteophyte with fusion C6 through T2.  C6-7: Bulge and osteophyte with mild narrowing ventral aspect of the thecal sac greater on the left. Mild bilateral foraminal narrowing.  C7-T1: Uncinate hypertrophy. Mild to moderate foraminal narrowing bilaterally.  Small amount of fluid within the C1 occipital condyle articulation bilaterally and C1-C2 lateral mass joint bilaterally without surrounding bone edema or soft tissue edema (i.e., separate from the retropharyngeal edema) to suggest significant osseous or ligamentous injury.  IMPRESSION:  Interval increase in retropharyngeal edema/hemorrhage maximal at the C2 through C4 level.  Injury through the C4-5 anterior longitudinal ligament, C4-5 disc space extending to involve the C4-5 facet joints and posterior paraspinal musculature consistent with extension injury. Buckling of the posterior ligaments which appear edematous. Short pedicles. Broad-based disc osteophyte complex. Facet joint bony overgrowth. Uncinate hypertrophy. Severe C4-5 spinal stenosis and cord compression greater on the left (cord flattening to 3 mm). Marked bilateral foraminal narrowing.  Spur posterior C5 vertebral body greater on left with severe cord compression greater on the left.  C5-6 broad-based disc osteophyte complex with slight cephalad extension. Moderate to marked left-sided and moderate right-sided spinal stenosis. Marked  bilateral foraminal narrowing.  Edema within the cord extends from the C3 to the C6 level maximal at the C4 and C5 level. Gradient sequence without focal cord hemorrhage.  C2-3 shallow broad-based disc osteophyte complex greater to right. Mild narrowing ventral aspect of the thecal sac without cord compression.  C3-4 broad-based disc osteophyte complex greater to left. Moderate spinal stenosis greater on left. Marked left-sided and moderate right-sided foraminal narrowing. No edema involving the C3-4 anterior osteophyte to suggest acute fracture as questioned on recent CT.  Flowing osteophyte with fusion C6 through T2.  C6-7 bulge and osteophyte with mild narrowing ventral aspect of the thecal sac greater on the left. Mild bilateral foraminal narrowing.  C7-T1 mild to moderate foraminal narrowing bilaterally.  Small amount of fluid within the C1 occipital condyle articulation bilaterally and C1-C2 lateral mass joint bilaterally without surrounding bone edema or soft tissue edema (i.e., separate from the retropharyngeal edema) to suggest significant osseous or ligamentous injury.  These results were called by telephone at the time of interpretation on 04/15/2015 at 1:44 pm to Dr. Coletta Memos , who verbally acknowledged these results.   Electronically Signed   By: Lacy Duverney M.D.   On: 04/15/2015 14:24   Mr Cervical Spine Wo Contrast  04/14/2015   ADDENDUM REPORT: 04/14/2015 11:02 ADDENDUM: Original report by Dr. Karie Kirks. Addendum by Dr. Mosetta Putt: Abnormal T2 hyperintensity within the spinal cord is most consistent with edema, with myelomalacia a less likely consideration. Study discussed in person with Dr. Franky Macho on 04/14/2015 at 10 a.m. Electronically Signed   By: Sebastian Ache M.D.   On: 04/14/2015 11:02  04/14/2015   CLINICAL DATA:  Worsening RIGHT arm paresthesias after recent fall. History of carotid artery occlusion, hypertension, diabetes and stroke.  EXAM: MRI CERVICAL SPINE WITHOUT CONTRAST  TECHNIQUE:  Multiplanar, multisequence MR imaging of the cervical spine was performed. No intravenous contrast was administered.  COMPARISON:  None.  FINDINGS: Motion degraded examination, per technologist note, patient had difficulty remaining still even with sedation. Axial T2 and T2 gradient sequences nearly nondiagnostic, severely degraded sagittal STIR.  Cervical vertebral bodies and posterior elements appear intact and aligned with maintenance of cervical lordosis. However, there is mild widening of the C4-5 disc space ventrally, associated with bright STIR signal concerning for fracture, likely throughout osteophyte. Severe C5-6 disc height loss. Bridging bone marrow signal C6-7 and T1-2 consistent with auto interbody arthrodesis. No malalignment. Maintenance of cervical lordosis. Moderate to severe chronic discogenic endplate changes C4-5, C5-6. Disc desiccation C2-3 through C5-6 and, C7-T1.  Bright T2 signal within the spinal cord from C3 - C5-6, most consistent with cord edema or pre syrinx without frank syrinx. Craniocervical junction intact. Probable disruption of the anterior longitudinal ligament which is likely calcified at C4-5. Small apparent retropharyngeal versus prevertebral effusion, difficult to evaluate due to  patient motion.  Severely limited axial gradient sequences. 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 Superimposed degenerative change resulting in severe canal stenosis C3-4 thru C5-6, corroborated by sagittal imaging, AP dimension the canal is approximately 6 mm. Probable neural foraminal narrowing midcervical spine, limited assessment.  IMPRESSION: Motion degraded examination, nearly nondiagnostic axial sequences.  Acute fracture through the C4-5 anterior disc space which is likely calcified, including suspected focal disruption of the anterior longitudinal ligament, concerning for hyperextension injury. Probable prevertebral versus retropharyngeal effusion.   Spinal cord edema/pre syrinx C3 through C5-6 without frank syrinx.  Degenerative change of the cervical spine superimposed on congenital canal stenosis. Severe canal stenosis C3-4 through C5-6. Probable neural foraminal narrowing.  Recommend CT cervical spine to assess bony changes.  Electronically Signed: By: Awilda Metro M.D. On: 04/14/2015 02:33   Dg Chest Port 1 View  04/17/2015   CLINICAL DATA:  Shortness of breath.  EXAM: PORTABLE CHEST - 1 VIEW  COMPARISON:  04/15/2015 .  FINDINGS: Interim extubation. Stable cardiomegaly. Persistent bibasilar atelectasis and/or interstitial infiltrates. Basilar interstitial edema cannot be excluded. Small left pleural effusion . No pneumothorax. No acute bony abnormality .  IMPRESSION: 1. Interim extubation.  2.  Persistent cardiomegaly.  3. Persistent bibasilar atelectasis and interstitial prominence. Bibasilar pneumonia and or interstitial edema cannot be excluded. Persistent small left pleural effusion .   Electronically Signed   By: Maisie Fus  Register   On: 04/17/2015 08:08   Portable Chest Xray  04/15/2015   CLINICAL DATA:  79 year old male status post intubation.  EXAM: PORTABLE CHEST - 1 VIEW  COMPARISON:  Radiograph dated 04/13/2015 and CT dated 04/13/2015.  FINDINGS: An endotracheal tube is seen with tip approximately 5.5 cm above the carina. There is a small left pleural effusion. Bibasilar atelectatic changes again noted. Superimposed pneumonia is not excluded. There is no pneumothorax. Stable cardiac silhouette. The osseous structures appear grossly unremarkable. Right humeral head fixation pin.  IMPRESSION: Endotracheal tube above the carina.   Electronically Signed   By: Elgie Collard M.D.   On: 04/15/2015 23:22   Dg C-arm 1-60 Min-no Report  04/15/2015   CLINICAL DATA: for posterior cervical fusion,   C-ARM 1-60 MINUTES  Fluoroscopy was utilized by the requesting physician.  No radiographic  interpretation.     Jeoffrey Massed, MD  [facility] Pager:[phone]  If 7PM-7AM, please contact night-coverage [email or web address] Password [email or web address] 04/20/2015, 11:49 AM   LOS: 7 days

## 2015-04-21 ENCOUNTER — Inpatient Hospital Stay (HOSPITAL_COMMUNITY): Payer: Medicare Other

## 2015-04-21 DIAGNOSIS — M25519 Pain in unspecified shoulder: Secondary | ICD-10-CM

## 2015-04-21 LAB — CBC
HCT: 29.2 % — ABNORMAL LOW (ref 39.0–52.0)
HEMOGLOBIN: 9.7 g/dL — AB (ref 13.0–17.0)
MCH: 32.9 pg (ref 26.0–34.0)
MCHC: 33.2 g/dL (ref 30.0–36.0)
MCV: 99 fL (ref 78.0–100.0)
PLATELETS: 193 10*3/uL (ref 150–400)
RBC: 2.95 MIL/uL — AB (ref 4.22–5.81)
RDW: 15.1 % (ref 11.5–15.5)
WBC: 9 10*3/uL (ref 4.0–10.5)

## 2015-04-21 LAB — BASIC METABOLIC PANEL
ANION GAP: 7 (ref 5–15)
BUN: 25 mg/dL — ABNORMAL HIGH (ref 6–20)
CALCIUM: 8.4 mg/dL — AB (ref 8.9–10.3)
CO2: 23 mmol/L (ref 22–32)
CREATININE: 1.01 mg/dL (ref 0.61–1.24)
Chloride: 108 mmol/L (ref 101–111)
Glucose, Bld: 128 mg/dL — ABNORMAL HIGH (ref 65–99)
Potassium: 3.6 mmol/L (ref 3.5–5.1)
SODIUM: 138 mmol/L (ref 135–145)

## 2015-04-21 MED ORDER — RESOURCE THICKENUP CLEAR PO POWD
ORAL | Status: DC | PRN
  Filled 2015-04-21: qty 125

## 2015-04-21 NOTE — Clinical Social Work Note (Signed)
CSW met the pt and the family at the bedside. CSW presented the family with bed offer. CSW will follow up with the family regarding their SNF choice.   [place], MSW, [place]

## 2015-04-21 NOTE — Progress Notes (Signed)
Speech Language Pathology Treatment: Dysphagia  Patient Details Name: Charles Bridges MRN: [id number] DOB: 01-12-1936 Today's Date: 04/21/2015 Time: 1400-1410 SLP Time Calculation (min) (ACUTE ONLY): 10 min  Assessment / Plan / Recommendation Clinical Impression  Pt continues to have immediate throat clearing and coughing after advanced trials of thin liquids, despite SLP assistance for safe pacing. He is not able to sit fully upright due to c/o shoulder pain with more upright posture. He appears to have greater tolerance of nectar thick liquids, and remains afebrile. Will continue to follow for possible advancement versus need for objective testing.   HPI Other Pertinent Information: 79 year old male who fell at home presented to OSH initial Cspine CT clear. Developed upper extremity weakness and was transferred to [facility]. CT spine and MRI showed C4-5 fracture with cord edema C3 -6. Taken to OR for decompression. Post op on vent 9/20-9/21.   Pertinent Vitals Pain Assessment: Faces Pain Score: 7  Faces Pain Scale: Hurts even more Pain Location: shoulders Pain Descriptors / Indicators: Grimacing Pain Intervention(s): Limited activity within patient's tolerance;Monitored during session;Repositioned  SLP Plan  Continue with current plan of care    Recommendations Diet recommendations: Dysphagia 3 (mechanical soft);Nectar-thick liquid Liquids provided via: Cup;Straw Medication Administration: Whole meds with puree Supervision: Full supervision/cueing for compensatory strategies;Staff to assist with self feeding Compensations: Minimize environmental distractions;Slow rate;Small sips/bites Postural Changes and/or Swallow Maneuvers: Seated upright 90 degrees;Upright 30-60 min after meal       Oral Care Recommendations: Oral care BID Follow up Recommendations: Skilled Nursing facility Plan: Continue with current plan of care    Maxcine Ham, M.A. CCC-SLP [phone]  Maxcine Ham 04/21/2015, 2:31 PM

## 2015-04-21 NOTE — Care Management Important Message (Signed)
Important Message  Patient Details  Name: Charles Bridges MRN: [id number] Date of Birth: 1936/02/22   Medicare Important Message Given:  Yes-third notification given    Orson Aloe 04/21/2015, 3:19 PM

## 2015-04-21 NOTE — Progress Notes (Signed)
[facility] PROGRESS NOTE  JYQUAN KENLEY ZOX:[id number] DOB: 02-25-1936 DOA: 04/13/2015 PCP: Ailene Ravel, MD  Brief narrative:  79 year old male with history of prior CVA,on Coumadin, history of carotid artery occlusion status post CEA, EtOH use who fell at home and presented to OSH.Initial Cspine CT clear. Developed upper extremity weakness and was transferred to [facility]. CT spine and MRI showed C4-5 fracture with cord edema C3 -6. Taken to OR for decompression. Post op was placed on a ventilator and managed in the intensive care unit. Extubated on 9/21, and transferred to the [facility] hospitalist service on 9/23.  Subjective:  Still mildly confused, but much better than days prior. C/o burning pain in right arm and still with pain in right shoulder - unchanged.   Assessment/Plan: Principal Problem: Fracture of anterior osteophyte at C4-C5 level with Severe C4-5 spinal stenosis and cord compression: Underwent decompression on 9/20. Still with significant quadriparesis-bilateral upper extremity around 2/5, bilateral lower extremity around 3-4/5. Neurosurgery following, PT evaluation recommending SNF. Continue oxycodone prn for pain. Consider neurontin.   Active Problems: Acute respiratory failure in post operative setting: Extubated on 9/21. Currently breathing comfortably on room air.  Suspected aspiration pneumonia: Completed 7 days of Zosyn on 9/24.Remains afebrile, and without leukocytosis.  Acute encephalopathy: Suspect multifactorial etiology - EtOH withdrawal/narcotics/pneumonia all contributing. Improving after discontinuation of IV narcotics, much clearer today than the past few days. Continue to minimize narcotics as much as possible.   ARF: Likely pre-renal azotemia, resolved - Cr continues to improve with supportive care and is back to usual baseline.   Essential hypertension: Controlled, continue lisinopril, HCTZ and metoprolol. Follow and titrate accordingly  HLD  (hyperlipidemia): Continue pravastatin.  Left 11th Rib fracture: Incentive spirometry, supportive care.  Chronic Coumadin use: ? Reason-family not aware of diagnoses of A. fib or DVT/PE. Spoke with son/spouse on 9/24-given numerous recent falls recently-they are agreeable to continue to hold coumadin. They are aware of risk of CVA. Will start ASA.   Anemia: Likely secondary to acute/critical illness. Improving, continue to follow. No indication for transfusion at this time.  History of CVA/carotid artery disease-status post carotid endarterectomy: Nonfocal exam, appears to be stable. Resume aspirin regimen  History of peptic ulcer disease: Continue with PPI, is s/p vagotomy.  Hyponatremia: Resolved. Most likely secondary to beer potomania.  DVT Prophylaxis: SCD's Code Status: Full Family Communication: None at bedside  Disposition Plan: D/C to SNF in next day or two  Consultants:  Neurosurgery  Pulmonary/Intensive Care  Procedures:  None  Antibiotics:  Vancomycin 04/13/2015 >> 04/15/2015  Zosyn 04/13/2015 >> 04/20/2015  Objective: Filed Vitals:   04/21/15 0616  BP: 125/43  Pulse: 60  Temp: 98.1 F (36.7 C)  Resp: 18    Intake/Output Summary (Last 24 hours) at 04/21/15 1027 Last data filed at 04/21/15 8119  Gross per 24 hour  Intake      0 ml  Output   1250 ml  Net  -1250 ml   Filed Weights   04/13/15 1630  Weight: 103.148 kg (227 lb 6.4 oz)    Exam:   General:  Awake, NAD. Answers most questions appropriately, only mild confusion at times  Neck: Cervical collar in place  Cardiovascular: RRR, no murmurs  Respiratory: CTAB anteriorly, limited exam due to positional restrictions  Abdomen: Soft, +BS, non tender  Musculoskeletal: No edema, well perfused  Neurologic: Strength 2/5 in b/l UEs, 3/5 in b/l LEs   Skin: No rashes or cyanosis  Data Reviewed: Basic Metabolic Panel:  Recent Labs Lab 04/15/15 2243 04/16/15 0229 04/17/15 0300  04/18/15 0515 04/21/15 0529  NA 132* 131* 135 138 138  K 4.9 4.2 4.3 3.8 3.6  CL 100* 98* 101 107 108  CO2 GLUCOSE 165* 163* 120* 101* 128*  BUN 23* 23* 20 25* 25*  CREATININE 1.30* 1.25* 1.04 1.12 1.01  CALCIUM 8.3* 8.2* 8.1* 8.2* 8.4*  MG  --   --  2.3  --   --   PHOS  --   --  3.2  --   --    Liver Function Tests: No results for input(s): AST, ALT, ALKPHOS, BILITOT, PROT, ALBUMIN in the last 168 hours. No results for input(s): LIPASE, AMYLASE in the last 168 hours. No results for input(s): AMMONIA in the last 168 hours. CBC:  Recent Labs Lab 04/15/15 0555 04/17/15 0300 04/18/15 0515 04/21/15 0529  WBC 7.9 8.5 6.6 9.0  HGB 11.0* 9.7* 9.4* 9.7*  HCT 30.1* 28.6* 27.1* 29.2*  MCV 93.2 97.3 99.3 99.0  PLT 140* 150 160 193   Cardiac Enzymes: No results for input(s): CKTOTAL, CKMB, CKMBINDEX, TROPONINI in the last 168 hours. BNP (last 3 results) No results for input(s): BNP in the last 8760 hours.  ProBNP (last 3 results) No results for input(s): PROBNP in the last 8760 hours.  CBG:  Recent Labs Lab 04/16/15 1558 04/16/15 1937 04/16/15 2315 04/17/15 0321 04/17/15 0821  GLUCAP 124* 114* 102* 117* 107*    Recent Results (from the past 240 hour(s))  Surgical pcr screen     Status: Abnormal   Collection Time: 04/15/15  7:55 AM  Result Value Ref Range Status   MRSA, PCR NEGATIVE NEGATIVE Final   Staphylococcus aureus POSITIVE (A) NEGATIVE Final    Comment:        The Xpert SA Assay (FDA approved for NASAL specimens in patients over 64 years of age), is one component of a comprehensive surveillance program.  Test performance has been validated by [facility] for patients greater than or equal to 59 year old. It is not intended to diagnose infection nor to guide or monitor treatment.     Studies: Dg Shoulder Right Port  05/13/15   CLINICAL DATA:  Recent shoulder pain  EXAM: PORTABLE RIGHT SHOULDER - 2+ VIEW  COMPARISON:  None.   FINDINGS: Degenerative changes of the acromioclavicular joint are seen. Postoperative changes in the humeral head are noted. No acute fracture or dislocation is noted. Postsurgical changes are noted in the cervical spine.  IMPRESSION: No acute abnormality noted.   Electronically Signed   By: Alcide Clever M.D.   On: 05-13-2015 18:11   Scheduled Meds: . aspirin  81 mg Oral Daily  . enoxaparin (LOVENOX) injection  40 mg Subcutaneous Q24H  . folic acid  1 mg Oral Daily  . lisinopril  20 mg Oral Daily   And  . hydrochlorothiazide  12.5 mg Oral Daily  . latanoprost  1 drop Right Eye QHS  . metoprolol tartrate  50 mg Oral BID  . multivitamin with minerals  1 tablet Oral Daily  . pantoprazole  40 mg Oral Daily  . polyethylene glycol  17 g Oral BID  . pravastatin  40 mg Oral q1800  . senna  1 tablet Oral BID  . thiamine  100 mg Oral Daily   Continuous Infusions:   Principal Problem:   Sensory loss Active Problems:   Essential hypertension   HLD (hyperlipidemia)   Rib fracture  History of CVA (cerebrovascular accident)   Falls   Alcohol abuse   Hyponatremia   TIA (transient ischemic attack)   Cervical spinal cord injury without evidence of spinal bone injury   Acute respiratory failure, unspecified whether with hypoxia or hypercapnia   Aspiration pneumonia  Time spent: [address], Student-PA  [facility] If 7PM-7AM, please contact night-coverage at [email or web address], password [facility] 04/21/2015, 10:27 AM  LOS: 8 days    Attending MD note  Patient was seen, examined,treatment plan was discussed with the PA-S.  I have personally reviewed the clinical findings, lab, imaging studies and management of this patient in detail. I agree with the documentation, as recorded by the PA-S.   Mental status much improved, continue to minimize narcotics. X-ray of the right shoulder negative, right upper extremity Doppler negative. Suspect SNF on 9/27. Discussed with spouse at  bedside.   [facility]

## 2015-04-21 NOTE — Progress Notes (Signed)
VASCULAR LAB PRELIMINARY  PRELIMINARY  PRELIMINARY  PRELIMINARY  Right upper extremity venous duplex completed.    Preliminary report:  Right:  No evidence of DVT or superficial thrombosis.    Cestone,Helene, RVT 04/21/2015, 1:13 PM

## 2015-04-21 NOTE — Progress Notes (Signed)
Physical Therapy Treatment Patient Details Name: Charles Bridges MRN: [id number] DOB: Nov 29, 1935 Today's Date: 04/21/2015    History of Present Illness 79 year old male who fell at home presented to OSH initial Cspine CT clear. Developed upper extremity weakness and was transferred to [facility]. CT spine and MRI showed C4-5 fracture with cord edema C3 -6. Taken to OR for decompression. Post op on vent 9/20-9/21.    PT Comments    Pt with continued poor activity tolerance limited by bil shoulder pain. Decreased shoulder pain in chair position in bed with bil UE supported and propped on pillows. Pt educated for need to move, increase sitting tolerance to progress mobility and activity. Will continue to follow with family present throughout session.   Follow Up Recommendations  SNF     Equipment Recommendations       Recommendations for Other Services       Precautions / Restrictions Precautions Precautions: Fall;Cervical Required Braces or Orthoses: Cervical Brace Cervical Brace: Hard collar;At all times    Mobility  Bed Mobility Overal bed mobility: Needs Assistance Bed Mobility: Rolling;Sidelying to Sit;Sit to Sidelying Rolling: +2 for physical assistance;Total assist Sidelying to sit: Total assist;+2 for physical assistance     Sit to sidelying: +2 for physical assistance;Total assist General bed mobility comments: pt slightly assisting with left knee to bend knee and roll to side with max cues and total assist to roll to side, bring legs off of bed and elevate trunk from surface as well as return to side and supine  Transfers                 General transfer comment: unable due to poor balance and sitting tolerance  Ambulation/Gait                 Stairs            Wheelchair Mobility    Modified Rankin (Stroke Patients Only)       Balance Overall balance assessment: Needs assistance   Sitting balance-Leahy Scale: Poor Sitting balance -  Comments: pt with EOB sitting 12 min with min assist throughout and able to sit 5 sec with minguard assist but cannot maintain further or correct with LOB posteriorly and anteriorly                            Cognition Arousal/Alertness: Awake/alert Behavior During Therapy: WFL for tasks assessed/performed Overall Cognitive Status: Impaired/Different from baseline Area of Impairment: Orientation               General Comments: pt stating he has cups in both hands, pt was not holding anything    Exercises General Exercises - Upper Extremity Shoulder Flexion: PROM;10 reps;Seated Shoulder ABduction: PROM;Seated;10 reps General Exercises - Lower Extremity Long Arc Quad: AAROM;Seated;Both;10 reps Hip Flexion/Marching: AAROM;Seated;Both;10 reps    General Comments        Pertinent Vitals/Pain Pain Score: 7  Pain Location: bil shoulders Pain Descriptors / Indicators: Aching;Sore Pain Intervention(s): Limited activity within patient's tolerance;Premedicated before session;Repositioned    Home Living                      Prior Function            PT Goals (current goals can now be found in the care plan section) Progress towards PT goals: Progressing toward goals    Frequency       PT Plan  Current plan remains appropriate    Co-evaluation             End of Session Equipment Utilized During Treatment: Cervical collar Activity Tolerance: Patient limited by pain;Patient limited by fatigue Patient left: in bed;with call bell/phone within reach;with family/visitor present     Time: 1114-1140 PT Time Calculation (min) (ACUTE ONLY): 26 min  Charges:  $Therapeutic Exercise: 8-22 mins $Therapeutic Activity: 8-22 mins                    G Codes:      Delorse Lek 05-11-15, 12:30 PM Delaney Meigs, PT [phone]

## 2015-04-22 DIAGNOSIS — S2232XS Fracture of one rib, left side, sequela: Secondary | ICD-10-CM

## 2015-04-22 DIAGNOSIS — W19XXXA Unspecified fall, initial encounter: Secondary | ICD-10-CM

## 2015-04-22 MED ORDER — ASPIRIN 81 MG PO CHEW: 81.0000 mg | CHEWABLE_TABLET | Freq: Every day | ORAL | Status: AC

## 2015-04-22 MED ORDER — PANTOPRAZOLE SODIUM 40 MG PO TBEC: 40.0000 mg | DELAYED_RELEASE_TABLET | Freq: Every day | ORAL | Status: AC

## 2015-04-22 MED ORDER — THIAMINE HCL 100 MG PO TABS: 100.0000 mg | ORAL_TABLET | Freq: Every day | ORAL | Status: AC

## 2015-04-22 MED ORDER — FOLIC ACID 1 MG PO TABS: 1.0000 mg | ORAL_TABLET | Freq: Every day | ORAL | Status: AC

## 2015-04-22 MED ORDER — OXYCODONE HCL 10 MG PO TABS: 15.0000 mg | ORAL_TABLET | ORAL | Status: DC | PRN

## 2015-04-22 MED ORDER — OXYCODONE HCL 10 MG PO TABS: 10.0000 mg | ORAL_TABLET | ORAL | Status: DC | PRN

## 2015-04-22 MED ORDER — MORPHINE SULFATE (PF) 2 MG/ML IV SOLN
1.0000 mg | Freq: Once | INTRAVENOUS | Status: AC
  Administered 2015-04-22: 1 mg via INTRAVENOUS
  Filled 2015-04-22: qty 1

## 2015-04-22 MED ORDER — ADULT MULTIVITAMIN W/MINERALS CH: 1.0000 | ORAL_TABLET | Freq: Every day | ORAL | Status: AC

## 2015-04-22 MED ORDER — METOPROLOL TARTRATE 50 MG PO TABS: 50.0000 mg | ORAL_TABLET | Freq: Two times a day (BID) | ORAL | Status: DC

## 2015-04-22 MED ORDER — SENNA 8.6 MG PO TABS: 1.0000 | ORAL_TABLET | Freq: Two times a day (BID) | ORAL | Status: AC

## 2015-04-22 NOTE — Progress Notes (Signed)
This was a follow up visit. Unit chaplain tried to visit with patient early morning but patient was sleeping. I visited with patient and wife at bedside. Wife indicated that patient was still in pain but better than before. Patient is being discharged to [facility] for rehab. Provided ministry of presence, listening and emotional support.

## 2015-04-22 NOTE — Progress Notes (Signed)
Occupational Therapy Treatment Patient Details Name: GRIFFON HERBERG MRN: [id number] DOB: August 19, 1935 Today's Date: 04/22/2015    History of present illness 79 year old male who fell at home presented to OSH initial Cspine CT clear. Developed upper extremity weakness and was transferred to [facility]. CT spine and MRI showed C4-5 fracture with cord edema C3 -6. Taken to OR for decompression. Post op on vent 9/20-9/21.   OT comments  Pt with much improved cognition this visit. Oriented and asking questions about prognosis, wants to deer hunt.  Pt with generalized pain, but tolerating therapeutic exercises B UEs and repositioning for comfort and skin integrity.  Follow Up Recommendations  SNF;Supervision/Assistance - 24 hour    Equipment Recommendations       Recommendations for Other Services      Precautions / Restrictions Precautions Precautions: Fall;Cervical Required Braces or Orthoses: Cervical Brace Cervical Brace: Hard collar;At all times Restrictions Weight Bearing Restrictions: No       Mobility Bed Mobility Overal bed mobility: +2 for physical assistance Bed Mobility: Rolling Rolling: +2 for physical assistance;Total assist         General bed mobility comments: rolled pt for positioning, skin integrity  Transfers                      Balance                                   ADL                                                Vision                     Perception     Praxis      Cognition   Behavior During Therapy: WFL for tasks assessed/performed Overall Cognitive Status: Impaired/Different from baseline                  General Comments: pt able to describe his accident this visit, oriented to place and month, pt asking many questions about his prognosis    Extremity/Trunk Assessment               Exercises General Exercises - Upper Extremity Shoulder Flexion: PROM;Both;10 reps;Supine  (to 90 degrees) Shoulder ABduction: PROM;Both;10 reps;Supine (to 90 degrees) Elbow Flexion: AAROM;Both;10 reps;Supine Elbow Extension: AAROM;Both;10 reps;Supine Wrist Flexion: PROM;Both;10 reps;Supine Wrist Extension: PROM;Both;10 reps;Supine Digit Composite Flexion: AROM;Both;10 reps;Supine Composite Extension: AAROM;Both;10 reps;Supine   Shoulder Instructions       General Comments      Pertinent Vitals/ Pain       Pain Assessment: Faces Faces Pain Scale: Hurts whole lot Pain Location: all over Pain Descriptors / Indicators: Grimacing Pain Intervention(s): Limited activity within patient's tolerance;Monitored during session;Patient requesting pain meds-RN notified;Repositioned  Home Living                                          Prior Functioning/Environment              Frequency Min 2X/week     Progress Toward Goals  OT Goals(current goals can now be found in the care plan  section)  Progress towards OT goals: Progressing toward goals  Acute Rehab OT Goals Patient Stated Goal: To control pain  Plan Discharge plan remains appropriate    Co-evaluation                 End of Session Equipment Utilized During Treatment: Cervical collar   Activity Tolerance Patient limited by fatigue;Patient limited by pain   Patient Left in bed;with call bell/phone within reach;with nursing/sitter in room   Nurse Communication  (pt needs mattress overlay if does not discharge today)        Time: 1610-9604 OT Time Calculation (min): 25 min  Charges: OT General Charges $OT Visit: 1 Procedure OT Treatments $Therapeutic Exercise: 23-37 mins  Evern Bio 04/22/2015, 11:08 AM  [phone]

## 2015-04-22 NOTE — Progress Notes (Signed)
Patient discharged to SNF. Patient and family aware of d/c. Transported via [facility]AR. Neuro assessment unchanged.

## 2015-04-22 NOTE — Progress Notes (Signed)
Speech Language Pathology Treatment: Dysphagia  Patient Details Name: Charles Bridges MRN: [id number] DOB: 1935/09/30 Today's Date: 04/22/2015 Time: 4098-1191 SLP Time Calculation (min) (ACUTE ONLY): 21 min  Assessment / Plan / Recommendation Clinical Impression  Skilled treatment session focused on addressing dysphagia goals.  SLP provided ice chip trials and water via teaspoon trials, which patient consumed without overt s/s of aspiration.  Trials via cup and straw resulted in consistent s/s of inadequate airway protection with multiple swallows and either delayed throat clears or coughs.  Given ongoing s/s resulting in inability to upgrade at bedside an objective assessment is warranted.  FEES preferred assessment given diagnosis, positing and body habitus.     HPI Other Pertinent Information: 79 year old male who fell at home presented to OSH initial Cspine CT clear. Developed upper extremity weakness and was transferred to [facility]. CT spine and MRI showed C4-5 fracture with cord edema C3 -6. Taken to OR for decompression. Post op on vent 9/20-9/21.   Pertinent Vitals Pain Assessment: 0-10 Pain Score: 10-Worst pain ever Faces Pain Scale: Hurts whole lot Pain Location: all over Pain Descriptors / Indicators: Grimacing Pain Intervention(s): Limited activity within patient's tolerance;Monitored during session;Patient requesting pain meds-RN notified;Repositioned  Of note, patient also requested a visit from chaplin, RN was notified    SLP Plan  Other (Comment) (FEES)    Recommendations Diet recommendations: Dysphagia 3 (mechanical soft);Nectar-thick liquid Liquids provided via: Cup;Straw Medication Administration: Whole meds with puree Supervision: Full supervision/cueing for compensatory strategies;Staff to assist with self feeding Compensations: Minimize environmental distractions;Slow rate;Small sips/bites Postural Changes and/or Swallow Maneuvers: Seated upright 90 degrees;Upright 30-60  min after meal              Oral Care Recommendations: Oral care BID Follow up Recommendations: 24 hour supervision/assistance;Skilled Nursing facility Plan: Other (Comment) (FEES)    GO    Fae Pippin, M.A., CCC-SLP [phone]  Briscoe Daniello 04/22/2015, 11:12 AM

## 2015-04-22 NOTE — Procedures (Signed)
Objective Swallowing Evaluation: Other (Comment) (FEES)  Patient Details  Name: Charles Bridges MRN: [id number] Date of Birth: [birth year]  Today's Date: 04/22/2015 Time: SLP Start Time (ACUTE ONLY): 1251-SLP Stop Time (ACUTE ONLY): 1312 SLP Time Calculation (min) (ACUTE ONLY): 21 min  Past Medical History:  Past Medical History  Diagnosis Date  . Carotid artery occlusion   . Hypertension   . Hyperlipidemia   . Arthritis   . Stroke 1998 and  1999  . Ulcer     Hiatal Hernia / Gastric ulcers  . History of vagotomy   . Chronic diarrhea   . Fall against object Oct. 16, 2015    Pt got tangled up with dog chain out side in the woods.  . Diabetes mellitus 2006    TYPE 2   Past Surgical History:  Past Surgical History  Procedure Laterality Date  . Carotid endarterectomy  03/02/2000    Right CEA  . Fracture surgery      Right Shoulder  . Fracture right leg    . Radiology with anesthesia N/A 04/15/2015    Procedure: MRI CERVICAL SPINE WITHOUT CONTRAST    (RADIOLOGY WITH ANESTHESIA);  Surgeon: Medication Radiologist, MD;  Location: [place];  Service: Radiology;  Laterality: N/A;  . Posterior cervical fusion/foraminotomy N/A 04/15/2015    Procedure: POSTERIOR CERVICAL FUSION/FORAMINOTOMY LEVEL 3;  Surgeon: Ashok Pall, MD;  Location: [place];  Service: Neurosurgery;  Laterality: N/A;  C3-6 posterior cervical arthrodesis with instrumentation   HPI:  Other Pertinent Information: 79 year old male who fell at home presented to OSH initial Cspine CT clear. Developed upper extremity weakness and was transferred to [facility]. CT spine and MRI showed C4-5 fracture with cord edema C3 -6. Taken to OR for decompression. Post op on vent 9/20-9/21.  No Data Recorded  Assessment / Plan / Recommendation [facility] IP CLINICAL IMPRESSIONS 04/22/2015  Therapy Diagnosis Mild oral phase dysphagia;Mild pharyngeal phase dysphagia  Clinical Impression Patient present with an intact oral phase; however, fatigue and endurance  appear to at times impact ability to masticate Dys.3 textures.  Swallow initation was observed at the level of the valleculea with penentration post swallow with nectar-thick liquids x1 due to secreations mixing trace residue.  Pentration and aspiration were observed with thin liquids post swallow, at the interarytnoid space.  Question etiology of backflow versus residue due to minimal amounts and more ocurances with thinner consistencies.  Given good strength and clearance with other consistencies backflow is most likely.  Patietn was able to clear aspirates with a hrd reflexive cough and cues for mulitple swallows.  Recommend continueation of current diet orders with SLP at the next level of care to address advancment with ice chips/Water Protocol prior to advancement given limited mobility and cognition.         [facility] IP TREATMENT RECOMMENDATION 04/22/2015  Treatment Recommendations Defer treatment plan to SLP at SNF     [facility] IP DIET RECOMMENDATION 04/22/2015  SLP Diet Recommendations Dysphagia 3 (Mech soft);Nectar  Liquid Administration via Straw  Medication Administration Whole meds with puree  Compensations Minimize environmental distractions;Slow rate;Small sips/bites  Postural Changes and/or Swallow Maneuvers Up right      CH[facility] IP OTHER RECOMMENDATIONS 04/22/2015  Recommended Consults ? GI consult if s/s persist   Oral Care Recommendations Oral care BID  Other Recommendations Order thickener from pharmacy;Remove water pitcher;Prohibited food (jello, ice cream, thin soups)     [facility] IP FOLLOW UP RECOMMENDATIONS 04/22/2015  Follow up Recommendations 24 hour supervision/assistance;Skilled Nursing  facility     [facility] IP FREQUENCY AND DURATION 04/17/2015  Speech Therapy Frequency (ACUTE ONLY) min 2x/week  Treatment Duration 2 weeks     Pertinent Vitals/Pain 10/10 shoulder pain, RN aware and medicated patient prior to assessment       [facility] IP REASON FOR REFERRAL 04/22/2015  Reason for Referral  Objectively evaluate swallowing function     [facility] IP ORAL PHASE 04/22/2015  Lips (None)  Tongue (None)  Mucous membranes (None)  Nutritional status (None)  Other (None)  Oxygen therapy (None)  Oral Phase WFL  Oral - Pudding Teaspoon (None)  Oral - Pudding Cup (None)  Oral - Honey Teaspoon (None)  Oral - Honey Cup (None)  Oral - Honey Syringe (None)  Oral - Nectar Teaspoon (None)  Oral - Nectar Cup (None)  Oral - Nectar Straw (None)  Oral - Nectar Syringe (None)  Oral - Ice Chips (None)  Oral - Thin Teaspoon (None)  Oral - Thin Cup (None)  Oral - Thin Straw (None)  Oral - Thin Syringe (None)  Oral - Puree (None)  Oral - Mechanical Soft (None)  Oral - Regular (None)  Oral - Multi-consistency (None)  Oral - Pill (None)  Oral Phase - Comment (None)      CHL IP PHARYNGEAL PHASE 04/22/2015  Pharyngeal Phase Impaired  Pharyngeal - Pudding Teaspoon (None)  Penetration/Aspiration details (pudding teaspoon) (None)  Pharyngeal - Pudding Cup (None)  Penetration/Aspiration details (pudding cup) (None)  Pharyngeal - Honey Teaspoon (None)  Penetration/Aspiration details (honey teaspoon) (None)  Pharyngeal - Honey Cup (None)  Penetration/Aspiration details (honey cup) (None)  Pharyngeal - Honey Syringe (None)  Penetration/Aspiration details (honey syringe) (None)  Pharyngeal - Nectar Teaspoon (None)  Penetration/Aspiration details (nectar teaspoon) (None)  Pharyngeal - Nectar Cup (None)  Penetration/Aspiration details (nectar cup) (None)  Pharyngeal - Nectar Straw (None)  Penetration/Aspiration details (nectar straw) (None)  Pharyngeal - Nectar Syringe (None)  Penetration/Aspiration details (nectar syringe) (None)  Pharyngeal - Ice Chips (None)  Penetration/Aspiration details (ice chips) (None)  Pharyngeal - Thin Teaspoon (None)  Penetration/Aspiration details (thin teaspoon) (None)  Pharyngeal - Thin Cup (None)  Penetration/Aspiration details (thin cup) (None)  Pharyngeal -  Thin Straw (None)  Penetration/Aspiration details (thin straw) (None)  Pharyngeal - Thin Syringe (None)  Penetration/Aspiration details (thin syringe') (None)  Pharyngeal - Puree (None)  Penetration/Aspiration details (puree) (None)  Pharyngeal - Mechanical Soft (None)  Penetration/Aspiration details (mechanical soft) (None)  Pharyngeal - Regular (None)  Penetration/Aspiration details (regular) (None)  Pharyngeal - Multi-consistency (None)  Penetration/Aspiration details (multi-consistency) (None)  Pharyngeal - Pill (None)  Penetration/Aspiration details (pill) (None)  Pharyngeal Comment (None)      CHL IP CERVICAL ESOPHAGEAL PHASE 04/22/2015  Cervical Esophageal Phase Impaired  Pudding Teaspoon (None)  Pudding Cup (None)  Honey Teaspoon (None)  Honey Cup (None)  Honey Straw (None)  Nectar Teaspoon (None)  Nectar Cup (None)  Nectar Straw (None)  Nectar Sippy Cup (None)  Thin Teaspoon (None)  Thin Cup (None)  Thin Straw Esophageal backflow into the pharynx;Esophageal backflow into the larynx  Thin Sippy Cup (None)  Cervical Esophageal Comment from the interarytnoid space which was then aspirated, sensation intact and cough cleared aspirates with multple swallows clearing residual     Carmelia Roller., CCC-SLP [phone]       BOWIE,MELISSA 04/22/2015, 3:53 PM

## 2015-04-22 NOTE — Progress Notes (Signed)
No issues overnight. Pt continues to complain of pain.   EXAM:  BP 116/49 mmHg  Pulse 69  Temp(Src) 98.4 F (36.9 C) (Oral)  Resp 20  Ht  (1.854 m)  Wt 103.148 kg (227 lb 6.4 oz)  BMI 30.01 kg/m2  SpO2 93%  Awake, alert Speech fluent, appropriate  CN grossly intact  ~2/5 BUE, 4/5 BLE Wound dressing c/d/i  IMPRESSION:  79 y.o. male s/p posterior cervical decompression/stabilization, central cord syndrome. Remains neurologically unchanged  PLAN: - Stable for transfer to SNF - F/U with Dr. Franky Macho in a few weeks - Can remove dressing in 2 days - Cont to mobilize with PT/OT as tolerated

## 2015-04-22 NOTE — Progress Notes (Signed)
   04/22/15 1226  Clinical Encounter Type  Visited With Health care provider  Visit Type Initial  Referral From Nurse   Chaplain responded to a request from a nurse to visit with patient, but upon arriving to the unit the patient was asleep. Chaplain support available as needed.   Alda Ponder, Chaplain 04/22/2015 12:27 PM

## 2015-04-22 NOTE — Discharge Summary (Addendum)
PATIENT DETAILS Name: Charles Bridges Age: 79 y.o. Sex: male Date of Birth: 02-Dec-1935 MRN: [id number]. Admitting Physician: Coletta Memos, MD WUJ:[id number],WGNFA L, MD  Admit Date: 04/13/2015 Discharge date: 04/22/2015  Recommendations for Outpatient Follow-up:  1. Please repeat BMET/CBC in 1 week 2. Coumadin discontinued-given multiple falls-now on ASA-please reassess this issue as outpatient.  3. Ensure follow up with Dr Cabbell-Neurosurgery 4. Ensure physical therapy, speech therapy follow-up at SNF  PRIMARY DISCHARGE DIAGNOSIS:  Principal Problem:   Sensory loss Active Problems:   Essential hypertension   HLD (hyperlipidemia)   Rib fracture   History of CVA (cerebrovascular accident)   Falls   Alcohol abuse   Hyponatremia   TIA (transient ischemic attack)   Cervical spinal cord injury without evidence of spinal bone injury   Acute respiratory failure, unspecified whether with hypoxia or hypercapnia   Aspiration pneumonia      PAST MEDICAL HISTORY: Past Medical History  Diagnosis Date  . Carotid artery occlusion   . Hypertension   . Hyperlipidemia   . Arthritis   . Stroke 1998 and  1999  . Ulcer     Hiatal Hernia / Gastric ulcers  . History of vagotomy   . Chronic diarrhea   . Fall against object Oct. 16, 2015    Pt got tangled up with dog chain out side in the woods.  . Diabetes mellitus 2006    TYPE 2    DISCHARGE MEDICATIONS: Current Discharge Medication List    START taking these medications   Details  aspirin 81 MG chewable tablet Chew 1 tablet (81 mg total) by mouth daily.    folic acid (FOLVITE) 1 MG tablet Take 1 tablet (1 mg total) by mouth daily.    metoprolol (LOPRESSOR) 50 MG tablet Take 1 tablet (50 mg total) by mouth 2 (two) times daily.    Multiple Vitamin (MULTIVITAMIN WITH MINERALS) TABS tablet Take 1 tablet by mouth daily.    Oxycodone HCl 10 MG TABS Take 1.5 tablets (15 mg total) by mouth every 4 (four) hours as needed. Qty: 20  tablet, Refills: 0    pantoprazole (PROTONIX) 40 MG tablet Take 1 tablet (40 mg total) by mouth daily.    senna (SENOKOT) 8.6 MG TABS tablet Take 1 tablet (8.6 mg total) by mouth 2 (two) times daily. Refills: 0    thiamine 100 MG tablet Take 1 tablet (100 mg total) by mouth daily.      CONTINUE these medications which have NOT CHANGED   Details  latanoprost (XALATAN) 0.005 % ophthalmic solution Place 1 drop into the right eye at bedtime. Refills: 3    lisinopril-hydrochlorothiazide (PRINZIDE,ZESTORETIC) 20-12.5 MG per tablet Take 1 tablet by mouth daily.    pravastatin (PRAVACHOL) 40 MG tablet Take 40 mg by mouth daily.      STOP taking these medications     allopurinol (ZYLOPRIM) 300 MG tablet      warfarin (COUMADIN) 5 MG tablet         ALLERGIES:  No Known Allergies  BRIEF HPI:  See H&P, Labs, Consult and Test reports for all details in brief, patient 79 year old male with history of prior CVA,on Coumadin, history of carotid artery occlusion status post CEA, EtOH use who fell at home and presented to OSH.Initial Cspine CT clear. Developed upper extremity weakness and was transferred to [facility]. CT spine and MRI showed C4-5 fracture with cord edema C3 -6. Taken to OR for decompression  CONSULTATIONS:   pulmonary/intensive care and Neurosurgery  PERTINENT RADIOLOGIC STUDIES: Ct Cervical Spine Wo Contrast  04/14/2015   CLINICAL DATA:  79 year old male post trauma. Weakness. Subsequent encounter.  EXAM: CT CERVICAL SPINE WITHOUT CONTRAST  TECHNIQUE: Multidetector CT imaging of the cervical spine was performed without intravenous contrast. Multiplanar CT image reconstructions were also generated.  COMPARISON:  04/14/2015 cervical spine MR.  FINDINGS: Flowing confluent osteophyte C6 through T1 with fusion across the disc spaces.  Prominent anterior osteophyte C5-6 with areas of fusion of the anterior osteophyte.  On the accompanying motion degraded MR, there is prevertebral  edema/hemorrhage. Edema extends across the C4-5 anterior longitudinal ligament and disc space consistent with soft tissue injury. Possible mild edema right C4-5 interlaminar ligament. Fracture of the anterior osteophyte at the C4-5 level (although the borders appear fairly well defined) may have recurrent with hyperextension.  Fracture of the anterior C3-4 osteophyte suspected.  With the patient in a cervical collar, there is relatively normal alignment of the cervical spine.  Prominent cervical spondylotic changes with various degrees of spinal stenosis and foraminal narrowing most prominent C3-4 thru C5-6 where there is cord compression or cord edema extending from C3 through upper C6 on recent MR.  Patient will be scheduled for repeat MR.  IMPRESSION: On the accompanying motion degraded MR, there is prevertebral edema/hemorrhage. Edema extends across the C4-5 anterior longitudinal ligament and disc space consistent with soft tissue injury. Possible mild edema right C4-5 interlaminar ligament. Fracture of the anterior osteophyte at the C4-5 level (although the borders appear fairly well defined) may have recurrent with hyperextension.  Fracture of the anterior C3-4 osteophyte suspected.  Prominent cervical spondylotic changes with various degrees of spinal stenosis and foraminal narrowing most prominent C3-4 thru C5-6 where there is cord compression or cord edema extending from C3 through upper C6 on recent MR.  These results were reviewed with Dr. Franky Macho 04/14/2015  at 8:29 pm.   Electronically Signed   By: Lacy Duverney M.D.   On: 04/14/2015 20:55   Mr Brain Wo Contrast  04/14/2015   CLINICAL DATA:  Recent fall, worsening of RIGHT arm paresthesias and weakness. History of carotid artery occlusion, hypertension, diabetes, stroke.  EXAM: MRI HEAD WITHOUT CONTRAST  TECHNIQUE: Multiplanar, multiecho pulse sequences of the brain and surrounding structures were obtained without intravenous contrast.  COMPARISON:   None.  FINDINGS: Multiple sequences are mild to moderately motion degraded.  No reduced diffusion to suggest acute ischemia. No susceptibility artifact to suggest hemorrhage. Moderate to severe ventriculomegaly with proportional enlargement of cerebral sulci and cerebellar folia. Old RIGHT basal ganglia and bilateral thalamus lacunar infarcts. No midline shift, mass effect or mass lesions. RIGHT cerebral peduncle volume loss compatible with wallerian degeneration. A few scattered subcentimeter supratentorial white matter T2 hyperintensities, and T2 hyperintense signal within the pons consistent with mild chronic small vessel ischemic disease. No midline shift, mass effect or mass lesions.  No abnormal extra-axial fluid collections. Normal major intracranial vascular flow voids seen at the skull base.  Status post bilateral ocular lens implants. Moderate paranasal sinus mucosal thickening and probably inspissated mucus RIGHT maxillary sinus. The mastoid air cells are well aerated. No abnormal sellar expansion. No cerebellar tonsillar ectopia. No suspicious calvarial bone marrow signal. Patient is edentulous.  IMPRESSION: No acute intracranial process on this motion degraded examination.  Old RIGHT basal ganglia and bilateral thalamus infarcts.  Moderate to severe global brain atrophy. Mild chronic small vessel ischemic disease.   Electronically Signed   By: Awilda Metro M.D.   On: 04/14/2015 01:44  Mr Cervical Spine Wo Contrast  04/15/2015   CLINICAL DATA:  79 year old post trauma. Weakness. Subsequent encounter.  EXAM: MRI CERVICAL SPINE WITHOUT CONTRAST  TECHNIQUE: Multiplanar, multisequence MR imaging of the cervical spine was performed. No intravenous contrast was administered.  COMPARISON:  04/14/2015 CT and MR.  FINDINGS: Exam was attempted without sedation and revealed increase in the degree of retropharyngeal edema/ hemorrhage. Axial images remain motion degraded. Preliminary results discussed with  Dr. Franky Macho and it was elected to perform examination with anesthesia/moderate sedation.  On repeat imaging with sedation, retropharyngeal edema/hemorrhage is once again noted located anterior to the longus coli muscles extending from the clivus to the upper C5 level and maximal at the C2 through C4 level.  Injury through the C4-5 anterior longitudinal ligament, C4-5 disc space extending to involve the C4-5 facet joints and posterior paraspinal musculature consistent with extension injury. Buckling of the posterior ligaments which appear edematous. Short pedicles. Broad-based disc osteophyte complex. Facet joint bony overgrowth. Uncinate hypertrophy. Severe C4-5 spinal stenosis and cord compression greater on the left (cord flattening to 3 mm). Marked bilateral foraminal narrowing.  Spur posterior C5 vertebral body greater on left with severe cord compression greater on the left.  C5-6 broad-based disc osteophyte complex with slight cephalad extension. Moderate to marked left-sided and moderate right-sided spinal stenosis. Marked bilateral foraminal narrowing.  Edema within the cord extends from the C3 to the C6 level maximal at the C4 and C5 level. Gradient sequence without focal cord hemorrhage.  C2-3: Shallow broad-based disc osteophyte complex greater to right. Mild narrowing ventral aspect of the thecal sac without cord compression.  C3-4: No edema involving the C3-4 anterior osteophyte to suggest acute fracture as questioned on recent CT. Broad-based disc osteophyte complex greater to left. Moderate spinal stenosis greater on left. Facet joint degenerative changes. Marked left-sided and moderate right-sided foraminal narrowing.  Flowing osteophyte with fusion C6 through T2.  C6-7: Bulge and osteophyte with mild narrowing ventral aspect of the thecal sac greater on the left. Mild bilateral foraminal narrowing.  C7-T1: Uncinate hypertrophy. Mild to moderate foraminal narrowing bilaterally.  Small amount of fluid  within the C1 occipital condyle articulation bilaterally and C1-C2 lateral mass joint bilaterally without surrounding bone edema or soft tissue edema (i.e., separate from the retropharyngeal edema) to suggest significant osseous or ligamentous injury.  IMPRESSION: Interval increase in retropharyngeal edema/hemorrhage maximal at the C2 through C4 level.  Injury through the C4-5 anterior longitudinal ligament, C4-5 disc space extending to involve the C4-5 facet joints and posterior paraspinal musculature consistent with extension injury. Buckling of the posterior ligaments which appear edematous. Short pedicles. Broad-based disc osteophyte complex. Facet joint bony overgrowth. Uncinate hypertrophy. Severe C4-5 spinal stenosis and cord compression greater on the left (cord flattening to 3 mm). Marked bilateral foraminal narrowing.  Spur posterior C5 vertebral body greater on left with severe cord compression greater on the left.  C5-6 broad-based disc osteophyte complex with slight cephalad extension. Moderate to marked left-sided and moderate right-sided spinal stenosis. Marked bilateral foraminal narrowing.  Edema within the cord extends from the C3 to the C6 level maximal at the C4 and C5 level. Gradient sequence without focal cord hemorrhage.  C2-3 shallow broad-based disc osteophyte complex greater to right. Mild narrowing ventral aspect of the thecal sac without cord compression.  C3-4 broad-based disc osteophyte complex greater to left. Moderate spinal stenosis greater on left. Marked left-sided and moderate right-sided foraminal narrowing. No edema involving the C3-4 anterior osteophyte to suggest acute fracture as  questioned on recent CT.  Flowing osteophyte with fusion C6 through T2.  C6-7 bulge and osteophyte with mild narrowing ventral aspect of the thecal sac greater on the left. Mild bilateral foraminal narrowing.  C7-T1 mild to moderate foraminal narrowing bilaterally.  Small amount of fluid within the  C1 occipital condyle articulation bilaterally and C1-C2 lateral mass joint bilaterally without surrounding bone edema or soft tissue edema (i.e., separate from the retropharyngeal edema) to suggest significant osseous or ligamentous injury.  These results were called by telephone at the time of interpretation on 04/15/2015 at 1:44 pm to Dr. Coletta Memos , who verbally acknowledged these results.   Electronically Signed   By: Lacy Duverney M.D.   On: 04/15/2015 14:24   Mr Cervical Spine Wo Contrast  04/14/2015   ADDENDUM REPORT: 04/14/2015 11:02 ADDENDUM: Original report by Dr. Karie Kirks. Addendum by Dr. Mosetta Putt: Abnormal T2 hyperintensity within the spinal cord is most consistent with edema, with myelomalacia a less likely consideration. Study discussed in person with Dr. Franky Macho on 04/14/2015 at 10 a.m. Electronically Signed   By: Sebastian Ache M.D.   On: 04/14/2015 11:02  04/14/2015   CLINICAL DATA:  Worsening RIGHT arm paresthesias after recent fall. History of carotid artery occlusion, hypertension, diabetes and stroke.  EXAM: MRI CERVICAL SPINE WITHOUT CONTRAST  TECHNIQUE: Multiplanar, multisequence MR imaging of the cervical spine was performed. No intravenous contrast was administered.  COMPARISON:  None.  FINDINGS: Motion degraded examination, per technologist note, patient had difficulty remaining still even with sedation. Axial T2 and T2 gradient sequences nearly nondiagnostic, severely degraded sagittal STIR.  Cervical vertebral bodies and posterior elements appear intact and aligned with maintenance of cervical lordosis. However, there is mild widening of the C4-5 disc space ventrally, associated with bright STIR signal concerning for fracture, likely throughout osteophyte. Severe C5-6 disc height loss. Bridging bone marrow signal C6-7 and T1-2 consistent with auto interbody arthrodesis. No malalignment. Maintenance of cervical lordosis. Moderate to severe chronic discogenic endplate changes C4-5, C5-6.  Disc desiccation C2-3 through C5-6 and, C7-T1.  Bright T2 signal within the spinal cord from C3 - C5-6, most consistent with cord edema or pre syrinx without frank syrinx. Craniocervical junction intact. Probable disruption of the anterior longitudinal ligament which is likely calcified at C4-5. Small apparent retropharyngeal versus prevertebral effusion, difficult to evaluate due to patient motion.  Severely limited axial gradient sequences. 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 Superimposed degenerative change resulting in severe canal stenosis C3-4 thru C5-6, corroborated by sagittal imaging, AP dimension the canal is approximately 6 mm. Probable neural foraminal narrowing midcervical spine, limited assessment.  IMPRESSION: Motion degraded examination, nearly nondiagnostic axial sequences.  Acute fracture through the C4-5 anterior disc space which is likely calcified, including suspected focal disruption of the anterior longitudinal ligament, concerning for hyperextension injury. Probable prevertebral versus retropharyngeal effusion.  Spinal cord edema/pre syrinx C3 through C5-6 without frank syrinx.  Degenerative change of the cervical spine superimposed on congenital canal stenosis. Severe canal stenosis C3-4 through C5-6. Probable neural foraminal narrowing.  Recommend CT cervical spine to assess bony changes.  Electronically Signed: By: Awilda Metro M.D. On: 04/14/2015 02:33   Dg Chest Port 1 View  04/17/2015   CLINICAL DATA:  Shortness of breath.  EXAM: PORTABLE CHEST - 1 VIEW  COMPARISON:  04/15/2015 .  FINDINGS: Interim extubation. Stable cardiomegaly. Persistent bibasilar atelectasis and/or interstitial infiltrates. Basilar interstitial edema cannot be excluded. Small left pleural effusion . No pneumothorax. No acute bony  abnormality .  IMPRESSION: 1. Interim extubation.  2.  Persistent cardiomegaly.  3. Persistent bibasilar atelectasis and interstitial  prominence. Bibasilar pneumonia and or interstitial edema cannot be excluded. Persistent small left pleural effusion .   Electronically Signed   By: Maisie Fus  Register   On: 04/17/2015 08:08   Portable Chest Xray  04/15/2015   CLINICAL DATA:  79 year old male status post intubation.  EXAM: PORTABLE CHEST - 1 VIEW  COMPARISON:  Radiograph dated 04/13/2015 and CT dated 04/13/2015.  FINDINGS: An endotracheal tube is seen with tip approximately 5.5 cm above the carina. There is a small left pleural effusion. Bibasilar atelectatic changes again noted. Superimposed pneumonia is not excluded. There is no pneumothorax. Stable cardiac silhouette. The osseous structures appear grossly unremarkable. Right humeral head fixation pin.  IMPRESSION: Endotracheal tube above the carina.   Electronically Signed   By: Elgie Collard M.D.   On: 04/15/2015 23:22   Dg Shoulder Right Port  04/20/2015   CLINICAL DATA:  Recent shoulder pain  EXAM: PORTABLE RIGHT SHOULDER - 2+ VIEW  COMPARISON:  None.  FINDINGS: Degenerative changes of the acromioclavicular joint are seen. Postoperative changes in the humeral head are noted. No acute fracture or dislocation is noted. Postsurgical changes are noted in the cervical spine.  IMPRESSION: No acute abnormality noted.   Electronically Signed   By: Alcide Clever M.D.   On: 04/20/2015 18:11   Dg C-arm 1-60 Min-no Report  04/15/2015   CLINICAL DATA: for posterior cervical fusion,   C-ARM 1-60 MINUTES  Fluoroscopy was utilized by the requesting physician.  No radiographic  interpretation.      PERTINENT LAB RESULTS: CBC:  Recent Labs  04/21/15 0529  WBC 9.0  HGB 9.7*  HCT 29.2*  PLT 193   CMET CMP     Component Value Date/Time   NA 138 04/21/2015 0529   K 3.6 04/21/2015 0529   CL 108 04/21/2015 0529   CO2 23 04/21/2015 0529   GLUCOSE 128* 04/21/2015 0529   BUN 25* 04/21/2015 0529   CREATININE 1.01 04/21/2015 0529   CALCIUM 8.4* 04/21/2015 0529   GFRNONAA >60 04/21/2015  0529   GFRAA >60 04/21/2015 0529    GFR Estimated Creatinine Clearance: 77.3 mL/min (by C-G formula based on Cr of 1.01). No results for input(s): LIPASE, AMYLASE in the last 72 hours. No results for input(s): CKTOTAL, CKMB, CKMBINDEX, TROPONINI in the last 72 hours. Invalid input(s): POCBNP No results for input(s): DDIMER in the last 72 hours. No results for input(s): HGBA1C in the last 72 hours. No results for input(s): CHOL, HDL, LDLCALC, TRIG, CHOLHDL, LDLDIRECT in the last 72 hours. No results for input(s): TSH, T4TOTAL, T3FREE, THYROIDAB in the last 72 hours.  Invalid input(s): FREET3 No results for input(s): VITAMINB12, FOLATE, FERRITIN, TIBC, IRON, RETICCTPCT in the last 72 hours. Coags: No results for input(s): INR in the last 72 hours.  Invalid input(s): PT Microbiology: Recent Results (from the past 240 hour(s))  Surgical pcr screen     Status: Abnormal   Collection Time: 04/15/15  7:55 AM  Result Value Ref Range Status   MRSA, PCR NEGATIVE NEGATIVE Final   Staphylococcus aureus POSITIVE (A) NEGATIVE Final    Comment:        The Xpert SA Assay (FDA approved for NASAL specimens in patients over 63 years of age), is one component of a comprehensive surveillance program.  Test performance has been validated by [facility] for patients greater than or equal to 1  year old. It is not intended to diagnose infection nor to guide or monitor treatment.      BRIEF HOSPITAL COURSE:  Brief narrative:  79 year old male with history of prior CVA,on Coumadin, history of carotid artery occlusion status post CEA, EtOH use who fell at home and presented to OSH.Initial Cspine CT clear. Developed upper extremity weakness and was transferred to [facility]. CT spine and MRI showed C4-5 fracture with cord edema C3 -6. Taken to OR for decompression. Post op was placed on a ventilator and managed in the intensive care unit. Extubated on 9/21, and transferred to the medical surgical unit. Seen  by physical therapy services, recommendations are to transfer to SNF. Hospital course has been completed by development of acute encephalopathy, respiratory failure and presumed aspiration pneumonia.  Hospital course by problem list: Fracture of anterior osteophyte at C4-C5 level with Severe C4-5 spinal stenosis and cord compression: Underwent decompression on 9/20. Still with significant quadriparesis-bilateral upper extremity around 3/5, bilateral lower extremity around 3-4/5. Neurosurgery followed patient during this hospital stay, PT evaluation recommending SNF. Continue oxycodone prn for pain.  Acute respiratory failure in post operative setting: Extubated on 9/21. Currently breathing comfortably on room air.  Suspected aspiration pneumonia: Completed 7 days of Zosyn on 9/24.Remains afebrile, and without leukocytosis.  Dysphagia: Suspect secondary to acute illness, C-spine fracture, seen by SLP-recommendations are for a dysphagia 3 diet. This M.D. spoke with spouse on numerous occasions, family aware of aspiration risk. Please make sure patient has follow-up with SLP while at SNF.  Acute encephalopathy: Suspect multifactorial etiology - EtOH withdrawal/narcotics/pneumonia all contributing. Improving after discontinuation of IV narcotics, much clearer on day of discahrge than the past few days. Continue to minimize narcotics as much as possible.   ARF: Likely pre-renal azotemia, resolved - Cr continues to improve with supportive care and is back to usual baseline. Please check electrolytes in 1 week  Essential hypertension: Controlled, continue lisinopril, HCTZ and metoprolol. Follow and titrate accordingly 1 at SNF  HLD (hyperlipidemia): Continue pravastatin.  Left 11th Rib fracture: Continue with Incentive spirometry, supportive care.  Chronic Coumadin use: ? Reason-family not aware of diagnoses of A. fib or DVT/PE. Spoke with son/spouse on 9/24-given numerous recent falls recently-they  are agreeable to continue to hold coumadin. They are aware of risk of CVA. Will start ASA. Please readdress this issue when patient follows up with PCP.  Anemia: Likely secondary to acute/critical illness. Improving, continue to follow. No indication for transfusion at this time.  History of CVA/carotid artery disease-status post carotid endarterectomy: Nonfocal exam, appears to be stable. Continue aspirin/metoprolol and statin  History of peptic ulcer disease: Continue with PPI, is s/p vagotomy.  Hyponatremia: Resolved. Most likely secondary to beer potomania.  TODAY-DAY OF DISCHARGE:  Subjective:   Arul Farabee today has no headache,no chest abdominal pain,no new weakness tingling or numbness, feels much better. He continues to have significantly improved mental status over the past few days.  Objective:   Blood pressure 116/49, pulse 69, temperature 98.4 F (36.9 C), temperature source Oral, resp. rate 20, height 6\' 1"  (1.854 m), weight 103.148 kg (227 lb 6.4 oz), SpO2 93 %.  Intake/Output Summary (Last 24 hours) at 04/22/15 1120 Last data filed at 04/22/15 0830  Gross per 24 hour  Intake      0 ml  Output   1125 ml  Net  -1125 ml   Filed Weights   04/13/15 1630  Weight: 103.148 kg (227 lb 6.4 oz)    Exam  Awake Alert, Oriented *3, No new F.N deficits, Normal affect [place].AT,PERRAL Supple Neck,No JVD, No cervical lymphadenopathy appriciated.  Symmetrical Chest wall movement, Good air movement bilaterally, CTAB RRR,No Gallops,Rubs or new Murmurs, No Parasternal Heave +ve B.Sounds, Abd Soft, Non tender, No organomegaly appriciated, No rebound -guarding or rigidity. No Cyanosis, Clubbing or edema, No new Rash or bruise  DISCHARGE CONDITION: Stable  DISPOSITION: SNF  DISCHARGE INSTRUCTIONS:    Activity:  As tolerated with Full fall precautions use walker/cane & assistance as needed Fall precautions  Get Medicines reviewed and adjusted: Please take all your medications  with you for your next visit with your Primary MD  Please request your Primary MD to go over all hospital tests and procedure/radiological results at the follow up, please ask your Primary MD to get all Hospital records sent to his/her office.  If you experience worsening of your admission symptoms, develop shortness of breath, life threatening emergency, suicidal or homicidal thoughts you must seek medical attention immediately by calling 911 or calling your MD immediately  if symptoms less severe.  You must read complete instructions/literature along with all the possible adverse reactions/side effects for all the Medicines you take and that have been prescribed to you. Take any new Medicines after you have completely understood and accpet all the possible adverse reactions/side effects.   Do not drive when taking Pain medications.   Do not take more than prescribed Pain, Sleep and Anxiety Medications  Special Instructions: If you have smoked or chewed Tobacco  in the last 2 yrs please stop smoking, stop any regular Alcohol  and or any Recreational drug use.  Wear Seat belts while driving.  Please note  You were cared for by a hospitalist during your hospital stay. Once you are discharged, your primary care physician will handle any further medical issues. Please note that NO REFILLS for any discharge medications will be authorized once you are discharged, as it is imperative that you return to your primary care physician (or establish a relationship with a primary care physician if you do not have one) for your aftercare needs so that they can reassess your need for medications and monitor your lab values.   Diet recommendation: Dysphagia 3 diet with nectar thick fluids Meds in Puree Aspiration precautions:yes Full assistance with feeding, head end up of 60-90while feeding   Discharge Instructions    Call MD for:  redness, tenderness, or signs of infection (pain, swelling, redness,  odor or green/yellow discharge around incision site)    Complete by:  As directed      Diet - low sodium heart healthy    Complete by:  As directed      Increase activity slowly    Complete by:  As directed            Follow-up Information    Follow up with [facility], MD. Schedule an appointment as soon as possible for a visit in 2 weeks.   Specialty:  Family Medicine   Contact information:   Dr. Burnell Blanks [address] [place] [address] [phone]       Follow up with CABBELL,KYLE L, MD. Schedule an appointment as soon as possible for a visit in 2 weeks.   Specialty:  Neurosurgery   Contact information:   [address] [place] [address] [phone]       Total Time spent on discharge equals 45 minutes.  SignedJeoffrey Massed 04/22/2015 11:20 AM

## 2015-04-22 NOTE — Clinical Social Work Placement (Signed)
   CLINICAL SOCIAL WORK PLACEMENT  NOTE  Date:  04/22/2015  Patient Details  Name: Charles Bridges MRN: [id number] Date of Birth: [birth year]  Clinical Social Work is seeking post-discharge placement for this patient at the Skilled  Nursing Facility level of care (*CSW will initial, date and re-position this form in  chart as items are completed):  Yes   Patient/family provided with [place] Clinical Social Work Department's list of facilities offering this level of care within the geographic area requested by the patient (or if unable, by the patient's family).  Yes   Patient/family informed of their freedom to choose among providers that offer the needed level of care, that participate in Medicare, Medicaid or managed care program needed by the patient, have an available bed and are willing to accept the patient.  Yes   Patient/family informed of 's ownership interest in [facility] and [facility], as well as of the fact that they are under no obligation to receive care at these facilities.  PASRR submitted to EDS on 04/18/15     PASRR number received on 04/18/15     Existing PASRR number confirmed on       FL2 transmitted to all facilities in geographic area requested by pt/family on 04/18/15     FL2 transmitted to all facilities within larger geographic area on       Patient informed that his/her managed care company has contracts with or will negotiate with certain facilities, including the following:        Yes   Patient/family informed of bed offers received.  Patient chooses bed at [facility] [place]     Physician recommends and patient chooses bed at      Patient to be transferred to [facility] [place] on 04/22/15.  Patient to be transferred to facility by [facility]      Patient family notified on 04/22/15 of transfer.  Name of family member notified:  Kirsten Mckone, wife      PHYSICIAN       Additional Comment:     _______________________________________________ Gwynne Edinger, LCSW 04/22/2015, 11:10 AM

## 2015-04-22 NOTE — Progress Notes (Signed)
1 mg morphine wasted with Berneta Levins, RN

## 2015-04-24 ENCOUNTER — Encounter (HOSPITAL_COMMUNITY): Payer: Self-pay | Admitting: Neurosurgery

## 2015-04-27 ENCOUNTER — Emergency Department (HOSPITAL_COMMUNITY): Payer: Medicare Other

## 2015-04-27 ENCOUNTER — Inpatient Hospital Stay (HOSPITAL_COMMUNITY)
Admission: EM | Admit: 2015-04-27 | Discharge: 2015-05-05 | DRG: 682 | Disposition: A | Discharge status: Skilled Nursing Facility | Payer: Medicare Other | Attending: Internal Medicine | Admitting: Internal Medicine

## 2015-04-27 ENCOUNTER — Encounter (HOSPITAL_COMMUNITY): Payer: Self-pay | Admitting: Emergency Medicine

## 2015-04-27 DIAGNOSIS — H9190 Unspecified hearing loss, unspecified ear: Secondary | ICD-10-CM | POA: Diagnosis present

## 2015-04-27 DIAGNOSIS — D649 Anemia, unspecified: Secondary | ICD-10-CM | POA: Diagnosis present

## 2015-04-27 DIAGNOSIS — I6523 Occlusion and stenosis of bilateral carotid arteries: Secondary | ICD-10-CM | POA: Diagnosis not present

## 2015-04-27 DIAGNOSIS — S14123A Central cord syndrome at C3 level of cervical spinal cord, initial encounter: Secondary | ICD-10-CM | POA: Diagnosis present

## 2015-04-27 DIAGNOSIS — L97429 Non-pressure chronic ulcer of left heel and midfoot with unspecified severity: Secondary | ICD-10-CM | POA: Diagnosis not present

## 2015-04-27 DIAGNOSIS — Z7982 Long term (current) use of aspirin: Secondary | ICD-10-CM

## 2015-04-27 DIAGNOSIS — R441 Visual hallucinations: Secondary | ICD-10-CM | POA: Diagnosis present

## 2015-04-27 DIAGNOSIS — Z9289 Personal history of other medical treatment: Secondary | ICD-10-CM

## 2015-04-27 DIAGNOSIS — I10 Essential (primary) hypertension: Secondary | ICD-10-CM | POA: Diagnosis present

## 2015-04-27 DIAGNOSIS — N390 Urinary tract infection, site not specified: Secondary | ICD-10-CM | POA: Diagnosis present

## 2015-04-27 DIAGNOSIS — M25519 Pain in unspecified shoulder: Secondary | ICD-10-CM | POA: Diagnosis not present

## 2015-04-27 DIAGNOSIS — L97419 Non-pressure chronic ulcer of right heel and midfoot with unspecified severity: Secondary | ICD-10-CM | POA: Diagnosis not present

## 2015-04-27 DIAGNOSIS — R7881 Bacteremia: Secondary | ICD-10-CM | POA: Diagnosis present

## 2015-04-27 DIAGNOSIS — E860 Dehydration: Secondary | ICD-10-CM | POA: Diagnosis present

## 2015-04-27 DIAGNOSIS — Z981 Arthrodesis status: Secondary | ICD-10-CM

## 2015-04-27 DIAGNOSIS — M6289 Other specified disorders of muscle: Secondary | ICD-10-CM | POA: Diagnosis not present

## 2015-04-27 DIAGNOSIS — M1990 Unspecified osteoarthritis, unspecified site: Secondary | ICD-10-CM | POA: Diagnosis present

## 2015-04-27 DIAGNOSIS — I6529 Occlusion and stenosis of unspecified carotid artery: Secondary | ICD-10-CM | POA: Diagnosis present

## 2015-04-27 DIAGNOSIS — E870 Hyperosmolality and hypernatremia: Secondary | ICD-10-CM | POA: Diagnosis not present

## 2015-04-27 DIAGNOSIS — Z8673 Personal history of transient ischemic attack (TIA), and cerebral infarction without residual deficits: Secondary | ICD-10-CM | POA: Diagnosis not present

## 2015-04-27 DIAGNOSIS — S14123S Central cord syndrome at C3 level of cervical spinal cord, sequela: Secondary | ICD-10-CM | POA: Diagnosis not present

## 2015-04-27 DIAGNOSIS — B957 Other staphylococcus as the cause of diseases classified elsewhere: Secondary | ICD-10-CM | POA: Diagnosis present

## 2015-04-27 DIAGNOSIS — F1010 Alcohol abuse, uncomplicated: Secondary | ICD-10-CM | POA: Diagnosis present

## 2015-04-27 DIAGNOSIS — K449 Diaphragmatic hernia without obstruction or gangrene: Secondary | ICD-10-CM | POA: Diagnosis present

## 2015-04-27 DIAGNOSIS — Z7289 Other problems related to lifestyle: Secondary | ICD-10-CM

## 2015-04-27 DIAGNOSIS — E11621 Type 2 diabetes mellitus with foot ulcer: Secondary | ICD-10-CM | POA: Diagnosis present

## 2015-04-27 DIAGNOSIS — E785 Hyperlipidemia, unspecified: Secondary | ICD-10-CM | POA: Diagnosis present

## 2015-04-27 DIAGNOSIS — R0781 Pleurodynia: Secondary | ICD-10-CM | POA: Diagnosis not present

## 2015-04-27 DIAGNOSIS — Z978 Presence of other specified devices: Secondary | ICD-10-CM

## 2015-04-27 DIAGNOSIS — Z87891 Personal history of nicotine dependence: Secondary | ICD-10-CM | POA: Diagnosis not present

## 2015-04-27 DIAGNOSIS — T83511A Infection and inflammatory reaction due to indwelling urethral catheter, initial encounter: Secondary | ICD-10-CM

## 2015-04-27 DIAGNOSIS — R531 Weakness: Secondary | ICD-10-CM | POA: Diagnosis present

## 2015-04-27 DIAGNOSIS — R7989 Other specified abnormal findings of blood chemistry: Secondary | ICD-10-CM | POA: Diagnosis present

## 2015-04-27 DIAGNOSIS — R4182 Altered mental status, unspecified: Secondary | ICD-10-CM | POA: Insufficient documentation

## 2015-04-27 DIAGNOSIS — G9763 Postprocedural seroma of a nervous system organ or structure following a nervous system procedure: Secondary | ICD-10-CM | POA: Diagnosis present

## 2015-04-27 DIAGNOSIS — Z79899 Other long term (current) drug therapy: Secondary | ICD-10-CM

## 2015-04-27 DIAGNOSIS — R410 Disorientation, unspecified: Secondary | ICD-10-CM | POA: Diagnosis present

## 2015-04-27 DIAGNOSIS — N179 Acute kidney failure, unspecified: Principal | ICD-10-CM | POA: Diagnosis present

## 2015-04-27 DIAGNOSIS — G9340 Encephalopathy, unspecified: Secondary | ICD-10-CM | POA: Diagnosis present

## 2015-04-27 DIAGNOSIS — D539 Nutritional anemia, unspecified: Secondary | ICD-10-CM | POA: Diagnosis present

## 2015-04-27 DIAGNOSIS — S12300D Unspecified displaced fracture of fourth cervical vertebra, subsequent encounter for fracture with routine healing: Secondary | ICD-10-CM

## 2015-04-27 DIAGNOSIS — R509 Fever, unspecified: Secondary | ICD-10-CM | POA: Diagnosis not present

## 2015-04-27 DIAGNOSIS — L8990 Pressure ulcer of unspecified site, unspecified stage: Secondary | ICD-10-CM | POA: Insufficient documentation

## 2015-04-27 DIAGNOSIS — Z960 Presence of urogenital implants: Secondary | ICD-10-CM

## 2015-04-27 LAB — COMPREHENSIVE METABOLIC PANEL
ALK PHOS: 101 U/L (ref 38–126)
ALT: 50 U/L (ref 17–63)
ANION GAP: 12 (ref 5–15)
AST: 41 U/L (ref 15–41)
Albumin: 2.6 g/dL — ABNORMAL LOW (ref 3.5–5.0)
BILIRUBIN TOTAL: 1.1 mg/dL (ref 0.3–1.2)
BUN: 97 mg/dL — ABNORMAL HIGH (ref 6–20)
CALCIUM: 8.5 mg/dL — AB (ref 8.9–10.3)
CO2: 22 mmol/L (ref 22–32)
Chloride: 107 mmol/L (ref 101–111)
Creatinine, Ser: 2.22 mg/dL — ABNORMAL HIGH (ref 0.61–1.24)
GFR, EST AFRICAN AMERICAN: 31 mL/min — AB (ref >60)
GFR, EST NON AFRICAN AMERICAN: 27 mL/min — AB (ref >60)
Glucose, Bld: 142 mg/dL — ABNORMAL HIGH (ref 65–99)
Potassium: 4.3 mmol/L (ref 3.5–5.1)
SODIUM: 141 mmol/L (ref 135–145)
TOTAL PROTEIN: 6.1 g/dL — AB (ref 6.5–8.1)

## 2015-04-27 LAB — URINALYSIS W MICROSCOPIC (NOT AT ARMC)
Bilirubin Urine: NEGATIVE
Glucose, UA: NEGATIVE mg/dL
KETONES UR: NEGATIVE mg/dL
NITRITE: NEGATIVE
PH: 5 (ref 5.0–8.0)
PROTEIN: NEGATIVE mg/dL
Specific Gravity, Urine: 1.016 (ref 1.005–1.030)
UROBILINOGEN UA: 1 mg/dL (ref 0.0–1.0)

## 2015-04-27 LAB — CBG MONITORING, ED: Glucose-Capillary: 120 mg/dL — ABNORMAL HIGH (ref 65–99)

## 2015-04-27 LAB — CBC WITH DIFFERENTIAL/PLATELET
Basophils Absolute: 0 10*3/uL (ref 0.0–0.1)
Basophils Relative: 0 %
EOS ABS: 0.1 10*3/uL (ref 0.0–0.7)
Eosinophils Relative: 1 %
HEMATOCRIT: 26.4 % — AB (ref 39.0–52.0)
HEMOGLOBIN: 8.5 g/dL — AB (ref 13.0–17.0)
LYMPHS ABS: 0.7 10*3/uL (ref 0.7–4.0)
LYMPHS PCT: 7 %
MCH: 32.8 pg (ref 26.0–34.0)
MCHC: 32.2 g/dL (ref 30.0–36.0)
MCV: 101.9 fL — AB (ref 78.0–100.0)
MONOS PCT: 9 %
Monocytes Absolute: 0.9 10*3/uL (ref 0.1–1.0)
NEUTROS ABS: 7.8 10*3/uL — AB (ref 1.7–7.7)
NEUTROS PCT: 83 %
Platelets: 332 10*3/uL (ref 150–400)
RBC: 2.59 MIL/uL — AB (ref 4.22–5.81)
RDW: 15.2 % (ref 11.5–15.5)
WBC: 9.4 10*3/uL (ref 4.0–10.5)

## 2015-04-27 LAB — TYPE AND SCREEN
ABO/RH(D): A POS
Antibody Screen: NEGATIVE

## 2015-04-27 LAB — PROTIME-INR
INR: 1.44 (ref 0.00–1.49)
PROTHROMBIN TIME: 17.6 s — AB (ref 11.6–15.2)

## 2015-04-27 LAB — I-STAT CG4 LACTIC ACID, ED
LACTIC ACID, VENOUS: 0.91 mmol/L (ref 0.5–2.0)
Lactic Acid, Venous: 1 mmol/L (ref 0.5–2.0)

## 2015-04-27 LAB — FIBRINOGEN: FIBRINOGEN: 752 mg/dL — AB (ref 204–475)

## 2015-04-27 LAB — APTT: APTT: 32 s (ref 24–37)

## 2015-04-27 LAB — LACTIC ACID, PLASMA: Lactic Acid, Venous: 1.1 mmol/L (ref 0.5–2.0)

## 2015-04-27 LAB — PROCALCITONIN

## 2015-04-27 LAB — MAGNESIUM: MAGNESIUM: 3.2 mg/dL — AB (ref 1.7–2.4)

## 2015-04-27 MED ORDER — LATANOPROST 0.005 % OP SOLN
1.0000 [drp] | Freq: Every day | OPHTHALMIC | Status: DC
  Administered 2015-04-27 – 2015-05-04 (×8): 1 [drp] via OPHTHALMIC
  Filled 2015-04-27: qty 2.5

## 2015-04-27 MED ORDER — ONDANSETRON HCL 4 MG PO TABS: 4.0000 mg | ORAL_TABLET | Freq: Four times a day (QID) | ORAL | Status: DC | PRN

## 2015-04-27 MED ORDER — SODIUM CHLORIDE 0.9 % IV SOLN
2.0000 g | Freq: Three times a day (TID) | INTRAVENOUS | Status: DC
  Administered 2015-04-27 – 2015-04-28 (×2): 2 g via INTRAVENOUS
  Filled 2015-04-27 (×4): qty 2000

## 2015-04-27 MED ORDER — VITAMIN B-1 100 MG PO TABS
100.0000 mg | ORAL_TABLET | Freq: Every day | ORAL | Status: DC
  Administered 2015-04-27 – 2015-05-05 (×9): 100 mg via ORAL
  Filled 2015-04-27 (×9): qty 1

## 2015-04-27 MED ORDER — PANTOPRAZOLE SODIUM 40 MG PO TBEC
40.0000 mg | DELAYED_RELEASE_TABLET | Freq: Every day | ORAL | Status: DC
  Administered 2015-04-27 – 2015-05-05 (×9): 40 mg via ORAL
  Filled 2015-04-27 (×9): qty 1

## 2015-04-27 MED ORDER — ONDANSETRON HCL 4 MG/2ML IJ SOLN: 4.0000 mg | Freq: Four times a day (QID) | INTRAMUSCULAR | Status: DC | PRN

## 2015-04-27 MED ORDER — ASPIRIN 81 MG PO CHEW
81.0000 mg | CHEWABLE_TABLET | Freq: Every day | ORAL | Status: DC
  Administered 2015-04-27 – 2015-05-05 (×9): 81 mg via ORAL
  Filled 2015-04-27 (×9): qty 1

## 2015-04-27 MED ORDER — METOPROLOL TARTRATE 25 MG PO TABS
25.0000 mg | ORAL_TABLET | Freq: Two times a day (BID) | ORAL | Status: DC
  Administered 2015-04-27 – 2015-05-05 (×16): 25 mg via ORAL
  Filled 2015-04-27 (×16): qty 1

## 2015-04-27 MED ORDER — THIAMINE HCL 100 MG/ML IJ SOLN
Freq: Once | INTRAVENOUS | Status: AC
  Administered 2015-04-28: 02:00:00 via INTRAVENOUS
  Filled 2015-04-27: qty 1000

## 2015-04-27 MED ORDER — THIAMINE HCL 100 MG/ML IJ SOLN
100.0000 mg | Freq: Once | INTRAMUSCULAR | Status: DC
  Filled 2015-04-27: qty 2

## 2015-04-27 MED ORDER — ACETAMINOPHEN 325 MG PO TABS: 650.0000 mg | ORAL_TABLET | Freq: Four times a day (QID) | ORAL | Status: DC | PRN

## 2015-04-27 MED ORDER — FOLIC ACID 1 MG PO TABS
1.0000 mg | ORAL_TABLET | Freq: Every day | ORAL | Status: DC
  Administered 2015-04-27 – 2015-05-05 (×9): 1 mg via ORAL
  Filled 2015-04-27 (×9): qty 1

## 2015-04-27 MED ORDER — ACETAMINOPHEN 650 MG RE SUPP: 650.0000 mg | Freq: Four times a day (QID) | RECTAL | Status: DC | PRN

## 2015-04-27 MED ORDER — SENNA 8.6 MG PO TABS
1.0000 | ORAL_TABLET | Freq: Two times a day (BID) | ORAL | Status: DC
  Administered 2015-04-27 – 2015-05-05 (×11): 8.6 mg via ORAL
  Filled 2015-04-27 (×13): qty 1

## 2015-04-27 MED ORDER — DEXTROSE 5 % IV SOLN
2.0000 g | Freq: Two times a day (BID) | INTRAVENOUS | Status: DC
  Administered 2015-04-28 (×2): 2 g via INTRAVENOUS
  Filled 2015-04-27 (×3): qty 2

## 2015-04-27 MED ORDER — SODIUM CHLORIDE 0.9 % IV BOLUS (SEPSIS)
1000.0000 mL | Freq: Once | INTRAVENOUS | Status: AC
  Administered 2015-04-27: 1000 mL via INTRAVENOUS

## 2015-04-27 MED ORDER — SODIUM CHLORIDE 0.9 % IJ SOLN
3.0000 mL | Freq: Two times a day (BID) | INTRAMUSCULAR | Status: DC
  Administered 2015-04-28 – 2015-05-03 (×8): 3 mL via INTRAVENOUS

## 2015-04-27 MED ORDER — PRAVASTATIN SODIUM 40 MG PO TABS
40.0000 mg | ORAL_TABLET | Freq: Every day | ORAL | Status: DC
  Administered 2015-04-28 – 2015-05-04 (×7): 40 mg via ORAL
  Filled 2015-04-27 (×7): qty 1

## 2015-04-27 MED ORDER — VANCOMYCIN HCL 10 G IV SOLR
1250.0000 mg | INTRAVENOUS | Status: DC
  Administered 2015-04-27: 1250 mg via INTRAVENOUS
  Filled 2015-04-27: qty 1250

## 2015-04-27 NOTE — Progress Notes (Signed)
ANTIBIOTIC CONSULT NOTE - INITIAL  Pharmacy Consult for Vancomycin, Rocephin, Ampicillin Indication: rule out meningitis  No Known Allergies  Patient Measurements: Height:  (185.4 cm) Weight: 220 lb (99.791 kg) IBW/kg (Calculated) : 79.9  Vital Signs: Temp: 98.3 F (36.8 C) (10/02 1742) Temp Source: Oral (10/02 1742) BP: 126/50 mmHg (10/02 2015) Pulse Rate: 55 (10/02 1945) Intake/Output from previous day:   Intake/Output from this shift:    Labs:  Recent Labs  04/27/15 1832  WBC 9.4  HGB 8.5*  PLT 332  CREATININE 2.22*   Estimated Creatinine Clearance: 34.6 mL/min (by C-G formula based on Cr of 2.22). No results for input(s): VANCOTROUGH, VANCOPEAK, VANCORANDOM, GENTTROUGH, GENTPEAK, GENTRANDOM, TOBRATROUGH, TOBRAPEAK, TOBRARND, AMIKACINPEAK, AMIKACINTROU, AMIKACIN in the last 72 hours.   Microbiology: Recent Results (from the past 720 hour(s))  Surgical pcr screen     Status: Abnormal   Collection Time: 04/15/15  7:55 AM  Result Value Ref Range Status   MRSA, PCR NEGATIVE NEGATIVE Final   Staphylococcus aureus POSITIVE (A) NEGATIVE Final    Comment:        The Xpert SA Assay (FDA approved for NASAL specimens in patients over 77 years of age), is one component of a comprehensive surveillance program.  Test performance has been validated by [facility] for patients greater than or equal to 77 year old. It is not intended to diagnose infection nor to guide or monitor treatment.     Medical History: Past Medical History  Diagnosis Date  . Carotid artery occlusion   . Hypertension   . Hyperlipidemia   . Arthritis   . Stroke [facility]) 1998 and  1999  . Ulcer     Hiatal Hernia / Gastric ulcers  . History of vagotomy   . Chronic diarrhea   . Fall against object Oct. 16, 2015    Pt got tangled up with dog chain out side in the woods.  . Diabetes mellitus 2006    TYPE 2    Medications:  Med History pending  Assessment: 79 yo M recently  discharged from [facility] after a C4-5 fusion surgery (04/13/15 > 04/22/15).  Since that time pt has had increased weakness, AMS.  Currently on antibiotics for UTI.  WBC 9.4, SCr 2.22 (SCr was 1 last admission), afebrile  PMH: HTN, HLD, CVA, EtOH use, hyponatremia  Goal of Therapy:  Vancomycin trough level 15-20 mcg/ml  Plan:  Rocephin 2gm IV q12h Ampicillin 2gm IV q8h Vancomycin  IV q24h   Ghina Bittinger, Pharm.D., BCPS Clinical Pharmacist Pager [phone] 04/27/2015 9:42 PM

## 2015-04-27 NOTE — ED Provider Notes (Signed)
CSN: [id number]     Arrival date & time 04/27/15  1723 History   First MD Initiated Contact with Patient 04/27/15 1744     Chief Complaint  Patient presents with  . Altered Mental Status     (Consider location/radiation/quality/duration/timing/severity/associated sxs/prior Treatment) HPI 79 year old male who presents with altered mental status. History of hypertension, hyperlipidemia, diabetes, and prior CVA. He was recently discharged from the hospital last week after experiencing fall complicated by central cord syndrome requiring neurosurgical intervention. I was discharged to subacute rehabilitation facility, and family reports that he was regaining strength in his right upper extremity which is an improvement for headache. Over the past 2 days he has been having increasing altered mental status and confusion. They state that he complains of visual hallucinations and has been having inappropriate speech. 2 days ago started treatment for urinary tract infection. They have also recently started him on oxycodone. Denies fever, chills, nausea, vomiting, wound complications, diarrhea, cough, difficulty breathing, or abdominal pain. Past Medical History  Diagnosis Date  . Carotid artery occlusion   . Hypertension   . Hyperlipidemia   . Arthritis   . Stroke [facility]) 1998 and  1999  . Ulcer     Hiatal Hernia / Gastric ulcers  . History of vagotomy   . Chronic diarrhea   . Fall against object Oct. 16, 2015    Pt got tangled up with dog chain out side in the woods.  . Diabetes mellitus 2006    TYPE 2   Past Surgical History  Procedure Laterality Date  . Carotid endarterectomy  03/02/2000    Right CEA  . Fracture surgery      Right Shoulder  . Fracture right leg    . Radiology with anesthesia N/A 04/15/2015    Procedure: MRI CERVICAL SPINE WITHOUT CONTRAST    (RADIOLOGY WITH ANESTHESIA);  Surgeon: Medication Radiologist, MD;  Location: [facility] OR;  Service: Radiology;  Laterality: N/A;  .  Posterior cervical fusion/foraminotomy N/A 04/15/2015    Procedure: POSTERIOR CERVICAL FUSION/FORAMINOTOMY LEVEL 3;  Surgeon: Coletta Memos, MD;  Location: [facility] OR;  Service: Neurosurgery;  Laterality: N/A;  C3-6 posterior cervical arthrodesis with instrumentation   Family History  Problem Relation Age of Onset  . Cancer Mother     breast cancer  . Other Mother     Circulation problems and lower extremity amputation  . Other Father     cerebrovascular accident   Social History  Substance Use Topics  . Smoking status: Former Smoker    Types: Cigarettes    Quit date: 07/27/1999  . Smokeless tobacco: Never Used  . Alcohol Use: Yes     Comment: drinks 2 to 3 beers per night     Review of Systems  Unable to perform ROS: Mental status change      Allergies  Review of patient's allergies indicates no known allergies.  Home Medications   Prior to Admission medications   Medication Sig Start Date End Date Taking? Authorizing Provider  aspirin 81 MG chewable tablet Chew 1 tablet (81 mg total) by mouth daily. 04/22/15  Yes Shanker Levora Dredge, MD  ciprofloxacin (CIPRO) 500 MG tablet Take 500 mg by mouth 2 (two) times daily. Started 04/25/15, for 5 days ending 04/29/15   Yes Historical Provider, MD  folic acid (FOLVITE) 1 MG tablet Take 1 tablet (1 mg total) by mouth daily. 04/22/15  Yes Shanker Levora Dredge, MD  latanoprost (XALATAN) 0.005 % ophthalmic solution Place 1 drop into the right  eye at bedtime. 04/03/15  Yes Historical Provider, MD  lisinopril-hydrochlorothiazide (PRINZIDE,ZESTORETIC) 20-12.5 MG per tablet Take 1 tablet by mouth daily.   Yes Historical Provider, MD  metoprolol (LOPRESSOR) 50 MG tablet Take 1 tablet (50 mg total) by mouth 2 (two) times daily. 04/22/15  Yes Shanker Levora Dredge, MD  Multiple Vitamin (MULTIVITAMIN WITH MINERALS) TABS tablet Take 1 tablet by mouth daily. 04/22/15  Yes Shanker Levora Dredge, MD  Oxycodone HCl 10 MG TABS Take 1.5 tablets (15 mg total) by mouth every 4  (four) hours as needed. 04/22/15  Yes Shanker Levora Dredge, MD  pantoprazole (PROTONIX) 40 MG tablet Take 1 tablet (40 mg total) by mouth daily. 04/22/15  Yes Shanker Levora Dredge, MD  pravastatin (PRAVACHOL) 40 MG tablet Take 40 mg by mouth daily.   Yes Historical Provider, MD  senna (SENOKOT) 8.6 MG TABS tablet Take 1 tablet (8.6 mg total) by mouth 2 (two) times daily. 04/22/15  Yes Shanker Levora Dredge, MD  thiamine 100 MG tablet Take 1 tablet (100 mg total) by mouth daily. 04/22/15  Yes Shanker Levora Dredge, MD   BP 126/50 mmHg  Pulse 55  Temp(Src) 98.3 F (36.8 C) (Oral)  Resp 17  Ht  (1.854 m)  Wt 220 lb (99.791 kg)  BMI 29.03 kg/m2  SpO2 96% Physical Exam Physical Exam  Nursing note and vitals reviewed. Constitutional: Well developed, well nourished, non-toxic, and in no acute distress Head: Normocephalic and atraumatic.  Mouth/Throat: Oropharynx is clear. Mucous membranes appear dry.  Neck: Cervical collar in place Cardiovascular: Normal rate and regular rhythm.   no lower extremity edema Pulmonary/Chest: Effort normal and breath sounds normal.  Abdominal: Soft. There is no tenderness. There is no rebound and no guarding.  Foley catheter in place.  Musculoskeletal: Normal range of motion.  Neurological: Alert, obeys simple commands, does not answer questions appropriately, no facial droop, only able to have hand grip in the right upper extremity. Unable to move left upper extremity and bilateral lower extremities, which is his baseline since his neck injury Skin: Skin is warm and dry.  Psychiatric: Cooperative  ED Course  Procedures (including critical care time) Labs Review Labs Reviewed  CBC WITH DIFFERENTIAL/PLATELET - Abnormal; Notable for the following:    RBC 2.59 (*)    Hemoglobin 8.5 (*)    HCT 26.4 (*)    MCV 101.9 (*)    Neutro Abs 7.8 (*)    All other components within normal limits  COMPREHENSIVE METABOLIC PANEL - Abnormal; Notable for the following:    Glucose, Bld  142 (*)    BUN 97 (*)    Creatinine, Ser 2.22 (*)    Calcium 8.5 (*)    Total Protein 6.1 (*)    Albumin 2.6 (*)    GFR calc non Af Amer 27 (*)    GFR calc Af Amer 31 (*)    All other components within normal limits  MAGNESIUM - Abnormal; Notable for the following:    Magnesium 3.2 (*)    All other components within normal limits  CBG MONITORING, ED - Abnormal; Notable for the following:    Glucose-Capillary 120 (*)    All other components within normal limits  URINE CULTURE  CULTURE, BLOOD (ROUTINE X 2)  CULTURE, BLOOD (ROUTINE X 2)  URINALYSIS W MICROSCOPIC  LACTIC ACID, PLASMA  PROCALCITONIN  PROTIME-INR  APTT  FIBRINOGEN  I-STAT CG4 LACTIC ACID, ED  I-STAT CG4 LACTIC ACID, ED  TYPE AND SCREEN  Imaging Review Ct Head Wo Contrast  04/27/2015   CLINICAL DATA:  Weakness with altered mental status. Recent cervical fusion.  EXAM: CT HEAD WITHOUT CONTRAST  TECHNIQUE: Contiguous axial images were obtained from the base of the skull through the vertex without intravenous contrast.  COMPARISON:  CT head 04/13/2015.  FINDINGS: Scout view reveals recently placed posterior cervical fusion hardware. The integrity of the fusion and hardware not established with this CT head exam.  No evidence for acute infarction, hemorrhage, mass lesion, hydrocephalus, or extra-axial fluid. Moderate atrophy. Chronic microvascular ischemic change. Remote RIGHT basilar ganglia and BILATERAL thalamic lacunar infarcts. No occult subdural. No skull fracture. Advanced vascular calcification. Orbits, sinuses, mastoids unremarkable.  IMPRESSION: Chronic changes as described. No significant change from 04/13/2015.  No new areas of infarction, hemorrhage, or extra-axial fluid are observed.   Electronically Signed   By: Elsie Stain M.D.   On: 04/27/2015 18:27   Dg Chest Port 1 View  04/27/2015   CLINICAL DATA:  Altered mental status, shortness of breath  EXAM: PORTABLE CHEST 1 VIEW  COMPARISON:  04/17/2015   FINDINGS: There is mild bilateral chronic interstitial thickening. There is no focal parenchymal opacity. There is no pleural effusion or pneumothorax. The heart and mediastinal contours are unremarkable.  The osseous structures are unremarkable.  IMPRESSION: No active disease.   Electronically Signed   By: Elige Ko   On: 04/27/2015 18:52   I have personally reviewed and evaluated these images and lab results as part of my medical decision-making.   EKG Interpretation   Date/Time:  Sunday April 27 2015 19:52:32 EDT Ventricular Rate:  59 PR Interval:    QRS Duration: 110 QT Interval:  472 QTC Calculation: 468 R Axis:   71 Text Interpretation:  Normal sinus rhythm No significant change since last  EKG Confirmed by Azim Gillingham MD, Annabelle Harman ([id number]) on 04/27/2015 10:07:23 PM      MDM   Final diagnoses:  Delirium  Urinary tract infection associated with catheterization of urinary tract, initial encounter    In short this a 79 year old male with history of hypertension, diabetes, and prior CVA who is status post recent intervention for central cord syndrome who presents with altered mental status for 2 days. Nontoxic in no acute distress on presentation. He is alert, but does not answer questions appropriately. He does obeys simple commands, and he is at his neurological baseline since his surgery. Remainder of exam is unremarkable. Unclear if this may be due to recent use of oxycodone versus nonclearing urinary tract infection. CT head is unremarkable for acute intracranial processes. Evidence of dehydration with increased BUN and creatinine and received 1 L of IV fluids. Remainder basic blood work is unremarkable. Chest x-ray also not concerning for acute intra-thoracic processes. UA is currently pending at this time. Discussed with [facility] hospitalist, who will admit for acute delirium.   Lavera Guise, MD 04/27/15 2212

## 2015-04-27 NOTE — ED Notes (Signed)
Attempted report x 1.  Consulting with EDP regarding r/o meningitis per pharmacy.

## 2015-04-27 NOTE — ED Notes (Signed)
Pt had C4-C5 fusion on 9/20 and was transported to [facility] five days ago.  Staff reports that since arrival pt has had an increase in weakness and altered mental status.  Pt currently has a UTI and is being tx with antibiotics and pt is also on pain medications from recent surgery.

## 2015-04-27 NOTE — ED Notes (Signed)
Patient returned from CT

## 2015-04-27 NOTE — H&P (Signed)
[facility] History and Physical  Patient: Charles Bridges  MRN: [id number]  DOB: 1936-04-10  DOS: the patient was seen and examined on 04/27/2015 PCP: Ailene Ravel, MD  Referring physician: Dr. Nedra Hai Chief Complaint: Confusion  HPI: Charles Bridges is a 79 y.o. male with Past medical history of carotid occlusion, dyslipidemia, hypertension, recent C4 fracture repair, diabetes mellitus, history of alcohol use. Patient presents with complaints of confusion. The patient was recently hospitalized for C4 fracture repair. After the fracture the patient was discharged to a rehabilitation facility. In the rehabilitation facility the patient has been regaining his strength in the right upper extremity but started having increasing episodes of confusion and which is progressively worsening. There was no fall or trauma or injury reported. Patient did not have any nausea vomiting or diarrhea as well as burning urination. The patient has a chronic indwelling Foley catheter. No active bleeding reported as well. No recent change in his medications reported other than taking medication.  The patient is coming from home.  At his baseline ambulates with support And is independent for most of his ADL; manages his medication on his own.  Review of Systems: as mentioned in the history of present illness.  A comprehensive review of the other systems is negative.  Past Medical History  Diagnosis Date  . Carotid artery occlusion   . Hypertension   . Hyperlipidemia   . Arthritis   . Stroke [facility]) 1998 and  1999  . Ulcer     Hiatal Hernia / Gastric ulcers  . History of vagotomy   . Chronic diarrhea   . Fall against object Oct. 16, 2015    Pt got tangled up with dog chain out side in the woods.  . Diabetes mellitus 2006    TYPE 2   Past Surgical History  Procedure Laterality Date  . Carotid endarterectomy  03/02/2000    Right CEA  . Fracture surgery      Right Shoulder  . Fracture right leg     . Radiology with anesthesia N/A 04/15/2015    Procedure: MRI CERVICAL SPINE WITHOUT CONTRAST    (RADIOLOGY WITH ANESTHESIA);  Surgeon: Medication Radiologist, MD;  Location: [facility] OR;  Service: Radiology;  Laterality: N/A;  . Posterior cervical fusion/foraminotomy N/A 04/15/2015    Procedure: POSTERIOR CERVICAL FUSION/FORAMINOTOMY LEVEL 3;  Surgeon: Coletta Memos, MD;  Location: [facility] OR;  Service: Neurosurgery;  Laterality: N/A;  C3-6 posterior cervical arthrodesis with instrumentation   Social History:  reports that he quit smoking about 15 years ago. His smoking use included Cigarettes. He has never used smokeless tobacco. He reports that he drinks alcohol. He reports that he does not use illicit drugs.  No Known Allergies  Family History  Problem Relation Age of Onset  . Cancer Mother     breast cancer  . Other Mother     Circulation problems and lower extremity amputation  . Other Father     cerebrovascular accident    Prior to Admission medications   Medication Sig Start Date End Date Taking? Authorizing Provider  aspirin 81 MG chewable tablet Chew 1 tablet (81 mg total) by mouth daily. 04/22/15  Yes Shanker Levora Dredge, MD  ciprofloxacin (CIPRO) 500 MG tablet Take 500 mg by mouth 2 (two) times daily. Started 04/25/15, for 5 days ending 04/29/15   Yes Historical Provider, MD  folic acid (FOLVITE) 1 MG tablet Take 1 tablet (1 mg total) by mouth daily. 04/22/15  Yes Shanker Levora Dredge, MD  latanoprost (XALATAN) 0.005 % ophthalmic solution Place 1 drop into the right eye at bedtime. 04/03/15  Yes Historical Provider, MD  lisinopril-hydrochlorothiazide (PRINZIDE,ZESTORETIC) 20-12.5 MG per tablet Take 1 tablet by mouth daily.   Yes Historical Provider, MD  metoprolol (LOPRESSOR) 50 MG tablet Take 1 tablet (50 mg total) by mouth 2 (two) times daily. 04/22/15  Yes Shanker Levora Dredge, MD  Multiple Vitamin (MULTIVITAMIN WITH MINERALS) TABS tablet Take 1 tablet by mouth daily. 04/22/15  Yes Shanker Levora Dredge,  MD  Oxycodone HCl 10 MG TABS Take 1.5 tablets (15 mg total) by mouth every 4 (four) hours as needed. 04/22/15  Yes Shanker Levora Dredge, MD  pantoprazole (PROTONIX) 40 MG tablet Take 1 tablet (40 mg total) by mouth daily. 04/22/15  Yes Shanker Levora Dredge, MD  pravastatin (PRAVACHOL) 40 MG tablet Take 40 mg by mouth daily.   Yes Historical Provider, MD  senna (SENOKOT) 8.6 MG TABS tablet Take 1 tablet (8.6 mg total) by mouth 2 (two) times daily. 04/22/15  Yes Shanker Levora Dredge, MD  thiamine 100 MG tablet Take 1 tablet (100 mg total) by mouth daily. 04/22/15  Yes Maretta Bees, MD    Physical Exam: Filed Vitals:   04/27/15 2045 04/27/15 2100 04/27/15 2145 04/27/15 2323  BP: 124/45 116/45 127/56 127/39  Pulse: 66   60  Temp:    98.6 F (37 C)  TempSrc:      Resp: Height:    6' 1.5" (1.867 m)  Weight:    94.303 kg (207 lb 14.4 oz)  SpO2:   88% 100%    General: Alert, Awake and Oriented to Person. Appear in mild distress Eyes: PERRL ENT: Oral Mucosa clear moist. Neck: no JVD Cardiovascular: S1 and S2 Present, no Murmur, Peripheral Pulses Present Respiratory: Bilateral Air entry equal and Decreased,  Clear to Auscultation, no Crackles, no wheezes Abdomen: Bowel Sound present, Soft and no tenderness Skin: no Rash Extremities: n Pedal edema, no calf tenderness Neurologic: Mental status AAOx3, speech normal, attention normal,  Cranial Nerves PERRL, EOM normal and present, Motor strength bilaterally. Lower extremity, right upper extremity 3 x 5. Left upper extremity spastic Reflexes present knee and biceps, babinski equivocal,   Labs on Admission:  CBC:  Recent Labs Lab 04/21/15 0529 04/27/15 1832  WBC 9.0 9.4  NEUTROABS  --  7.8*  HGB 9.7* 8.5*  HCT 29.2* 26.4*  MCV 99.0 101.9*  PLT 193 332    CMP     Component Value Date/Time   NA 141 04/27/2015 1832   K 4.3 04/27/2015 1832   CL 107 04/27/2015 1832   CO2 22 04/27/2015 1832   GLUCOSE 142* 04/27/2015 1832    BUN 97* 04/27/2015 1832   CREATININE 2.22* 04/27/2015 1832   CALCIUM 8.5* 04/27/2015 1832   PROT 6.1* 04/27/2015 1832   ALBUMIN 2.6* 04/27/2015 1832   AST 41 04/27/2015 1832   ALT 50 04/27/2015 1832   ALKPHOS 101 04/27/2015 1832   BILITOT 1.1 04/27/2015 1832   GFRNONAA 27* 04/27/2015 1832   GFRAA 31* 04/27/2015 1832    No results for input(s): CKTOTAL, CKMB, CKMBINDEX, TROPONINI in the last 168 hours. BNP (last 3 results) No results for input(s): BNP in the last 8760 hours.  ProBNP (last 3 results) No results for input(s): PROBNP in the last 8760 hours.   Radiological Exams on Admission: Ct Head Wo Contrast  04/27/2015   CLINICAL DATA:  Weakness with altered mental status. Recent cervical  fusion.  EXAM: CT HEAD WITHOUT CONTRAST  TECHNIQUE: Contiguous axial images were obtained from the base of the skull through the vertex without intravenous contrast.  COMPARISON:  CT head 04/13/2015.  FINDINGS: Scout view reveals recently placed posterior cervical fusion hardware. The integrity of the fusion and hardware not established with this CT head exam.  No evidence for acute infarction, hemorrhage, mass lesion, hydrocephalus, or extra-axial fluid. Moderate atrophy. Chronic microvascular ischemic change. Remote RIGHT basilar ganglia and BILATERAL thalamic lacunar infarcts. No occult subdural. No skull fracture. Advanced vascular calcification. Orbits, sinuses, mastoids unremarkable.  IMPRESSION: Chronic changes as described. No significant change from 04/13/2015.  No new areas of infarction, hemorrhage, or extra-axial fluid are observed.   Electronically Signed   By: Elsie Stain M.D.   On: 04/27/2015 18:27   Dg Chest Port 1 View  04/27/2015   CLINICAL DATA:  Altered mental status, shortness of breath  EXAM: PORTABLE CHEST 1 VIEW  COMPARISON:  04/17/2015  FINDINGS: There is mild bilateral chronic interstitial thickening. There is no focal parenchymal opacity. There is no pleural effusion or  pneumothorax. The heart and mediastinal contours are unremarkable.  The osseous structures are unremarkable.  IMPRESSION: No active disease.   Electronically Signed   By: Elige Ko   On: 04/27/2015 18:52   Assessment/Plan 1. Acute encephalopathy Patient presents with complaints of confusion. The patient CT scan as well as chest x-ray does not show any acute abnormality. Blood work showed acute kidney injury. I will check ammonia level TSH free T4 B-12 folic acid level. I would empirically treated with antibiotics to cover for CNS infection. Will check sepsis workup as well. This appears to be most likely acute kidney injury causing medication side effect from opiates and delirium from recent hospitalization most likely. May require further workup as well as neurology consultation if does not improve.  2. Essential hypertension Currently blood pressure stable continue home medications.  3  HLD (hyperlipidemia) Holding medication at present.  4  History of CVA (cerebrovascular accident) Continuing with aspirin.  5  Alcohol abuse Patient as per the report given by wife drinks alcohol at his baseline on a daily basis 2-3 drinks or beers on a daily basis. Reportedly patient was also drinking alcohol when he had the fall before his recent hospitalization. At present that is also a possibility of B1 deficiency. I'll give him IV thiamine and continuing with an oral time is starting tomorrow as well. We'll give him banana bag as well.  6  AKI (acute kidney injury) (HCC)   Chronic indwelling Foley catheter Possible UTI. Patient is only having a chronic indwelling Foley catheter. We'll continue close monitoring and ins and outs. Most likely prerenal etiology. Recheck renal function in the morning and avoid nephrotoxic medication. Next on pharmacy consultation. Patient is currently being treated with broad-spectrum antibiotics. Patient was on ciprofloxacin as an outpatient which I would  currently discontinue with a consult for possible CNS side effects.  Nutrition: nothing by mouth DVT Prophylaxis: subcutaneous Heparin  Advance goals of care discussion: Full code   Family Communication: family was present at bedside, opportunity was given to ask question and all questions were answered satisfactorily at the time of interview. Disposition: Admitted as inpatient, telemetry unit.  Author: Lynden Oxford, MD [facility] Pager: [phone] 04/27/2015  If 7PM-7AM, please contact night-coverage [email or web address] Password [email or web address]

## 2015-04-27 NOTE — ED Notes (Signed)
Patient transported to CT 

## 2015-04-27 NOTE — ED Notes (Signed)
Hospitalist at bedside 

## 2015-04-27 NOTE — ED Notes (Signed)
Gave patient sips of water per Dr. Verdie Mosher.  Patient swallowed with no difficulty, lung sounds clear before and after.

## 2015-04-28 ENCOUNTER — Encounter (HOSPITAL_COMMUNITY): Payer: Self-pay | Admitting: Radiology

## 2015-04-28 DIAGNOSIS — Z960 Presence of urogenital implants: Secondary | ICD-10-CM

## 2015-04-28 DIAGNOSIS — S14123A Central cord syndrome at C3 level of cervical spinal cord, initial encounter: Secondary | ICD-10-CM | POA: Diagnosis present

## 2015-04-28 DIAGNOSIS — D649 Anemia, unspecified: Secondary | ICD-10-CM | POA: Diagnosis present

## 2015-04-28 DIAGNOSIS — N390 Urinary tract infection, site not specified: Secondary | ICD-10-CM | POA: Diagnosis present

## 2015-04-28 DIAGNOSIS — R531 Weakness: Secondary | ICD-10-CM | POA: Diagnosis present

## 2015-04-28 DIAGNOSIS — N179 Acute kidney failure, unspecified: Principal | ICD-10-CM

## 2015-04-28 DIAGNOSIS — Z978 Presence of other specified devices: Secondary | ICD-10-CM

## 2015-04-28 LAB — COMPREHENSIVE METABOLIC PANEL
ALBUMIN: 2.4 g/dL — AB (ref 3.5–5.0)
ALK PHOS: 101 U/L (ref 38–126)
ALT: 53 U/L (ref 17–63)
ANION GAP: 10 (ref 5–15)
AST: 51 U/L — ABNORMAL HIGH (ref 15–41)
BUN: 82 mg/dL — ABNORMAL HIGH (ref 6–20)
CHLORIDE: 113 mmol/L — AB (ref 101–111)
CO2: 23 mmol/L (ref 22–32)
Calcium: 8.6 mg/dL — ABNORMAL LOW (ref 8.9–10.3)
Creatinine, Ser: 1.66 mg/dL — ABNORMAL HIGH (ref 0.61–1.24)
GFR calc non Af Amer: 38 mL/min — ABNORMAL LOW (ref >60)
GFR, EST AFRICAN AMERICAN: 44 mL/min — AB (ref >60)
GLUCOSE: 150 mg/dL — AB (ref 65–99)
POTASSIUM: 4.3 mmol/L (ref 3.5–5.1)
SODIUM: 146 mmol/L — AB (ref 135–145)
Total Bilirubin: 1.2 mg/dL (ref 0.3–1.2)
Total Protein: 6.1 g/dL — ABNORMAL LOW (ref 6.5–8.1)

## 2015-04-28 LAB — CBC WITH DIFFERENTIAL/PLATELET
BASOS PCT: 0 %
Basophils Absolute: 0 10*3/uL (ref 0.0–0.1)
EOS ABS: 0.1 10*3/uL (ref 0.0–0.7)
EOS PCT: 1 %
HCT: 25.5 % — ABNORMAL LOW (ref 39.0–52.0)
HEMOGLOBIN: 8 g/dL — AB (ref 13.0–17.0)
LYMPHS ABS: 0.4 10*3/uL — AB (ref 0.7–4.0)
Lymphocytes Relative: 5 %
MCH: 32.1 pg (ref 26.0–34.0)
MCHC: 31.4 g/dL (ref 30.0–36.0)
MCV: 102.4 fL — ABNORMAL HIGH (ref 78.0–100.0)
MONOS PCT: 5 %
Monocytes Absolute: 0.4 10*3/uL (ref 0.1–1.0)
NEUTROS PCT: 89 %
Neutro Abs: 7 10*3/uL (ref 1.7–7.7)
PLATELETS: 316 10*3/uL (ref 150–400)
RBC: 2.49 MIL/uL — ABNORMAL LOW (ref 4.22–5.81)
RDW: 15.2 % (ref 11.5–15.5)
WBC: 7.8 10*3/uL (ref 4.0–10.5)

## 2015-04-28 LAB — T4, FREE: FREE T4: 1.07 ng/dL (ref 0.61–1.12)

## 2015-04-28 LAB — URINE CULTURE: Culture: NO GROWTH

## 2015-04-28 LAB — FOLATE: Folate: 42 ng/mL (ref >5.9)

## 2015-04-28 LAB — PROTIME-INR
INR: 1.34 (ref 0.00–1.49)
Prothrombin Time: 16.7 seconds — ABNORMAL HIGH (ref 11.6–15.2)

## 2015-04-28 LAB — TSH: TSH: 1.509 u[IU]/mL (ref 0.350–4.500)

## 2015-04-28 LAB — VITAMIN B12: VITAMIN B 12: 897 pg/mL (ref 180–914)

## 2015-04-28 LAB — AMMONIA: AMMONIA: 18 umol/L (ref 9–35)

## 2015-04-28 MED ORDER — ACETAMINOPHEN 325 MG PO TABS
650.0000 mg | ORAL_TABLET | Freq: Three times a day (TID) | ORAL | Status: DC
  Administered 2015-04-28 – 2015-05-02 (×12): 650 mg via ORAL
  Filled 2015-04-28 (×13): qty 2

## 2015-04-28 MED ORDER — HALOPERIDOL LACTATE 5 MG/ML IJ SOLN
5.0000 mg | Freq: Once | INTRAMUSCULAR | Status: DC | PRN
  Administered 2015-04-29: 5 mg via INTRAVENOUS
  Filled 2015-04-28: qty 1

## 2015-04-28 MED ORDER — CETYLPYRIDINIUM CHLORIDE 0.05 % MT LIQD
7.0000 mL | Freq: Two times a day (BID) | OROMUCOSAL | Status: DC
  Administered 2015-04-28 – 2015-05-05 (×14): 7 mL via OROMUCOSAL

## 2015-04-28 MED ORDER — VANCOMYCIN HCL IN DEXTROSE 750-5 MG/150ML-% IV SOLN
750.0000 mg | Freq: Two times a day (BID) | INTRAVENOUS | Status: DC
  Administered 2015-04-28 – 2015-04-30 (×4): 750 mg via INTRAVENOUS
  Filled 2015-04-28 (×5): qty 150

## 2015-04-28 MED ORDER — SODIUM CHLORIDE 0.45 % IV SOLN
INTRAVENOUS | Status: DC
  Administered 2015-04-28: 100 mL/h via INTRAVENOUS
  Administered 2015-04-29: 04:00:00 via INTRAVENOUS

## 2015-04-28 MED ORDER — ACETAMINOPHEN 650 MG RE SUPP: 650.0000 mg | Freq: Three times a day (TID) | RECTAL | Status: DC

## 2015-04-28 MED ORDER — CEFTRIAXONE SODIUM 2 G IJ SOLR
2.0000 g | INTRAMUSCULAR | Status: DC
  Administered 2015-04-29 – 2015-05-01 (×3): 2 g via INTRAVENOUS
  Filled 2015-04-28 (×3): qty 2

## 2015-04-28 NOTE — Consult Note (Signed)
I am the physician who evaluated this patient at the [facility]. He was sent to the ED for evaluation, after discussion with Dr. Jeral Fruit, primarily due to new weakness on his left side, as his last exam in the hospital by his neurosurgery team indicated that he had bilateral 2/5 upper extremity strength and bilateral 4/5 lower extremity strength, and my exam showed 0/5 LUE and 0/5 LLE strength. He also has had a change in mental status from oriented x 3 to oriented x 1. I appreciated the ongoing workup by his inpatient team.

## 2015-04-28 NOTE — Progress Notes (Signed)
ANTIBIOTIC CONSULT NOTE - Follow Up  Pharmacy Consult for Vancomycin Indication: R/o bateremia   No Known Allergies  Patient Measurements: Height: 6' 1.5" (186.7 cm) Weight: 207 lb 14.4 oz (94.303 kg) IBW/kg (Calculated) : 81.05  Vital Signs: Temp: 98 F (36.7 C) (10/03 2059) Temp Source: Oral (10/03 2059) BP: 117/56 mmHg (10/03 2059) Pulse Rate: 55 (10/03 2059) Intake/Output from previous day: 10/02 0701 - 10/03 0700 In: -  Out: 725 [Urine:725] Intake/Output from this shift: Total I/O In: -  Out: 275 [Urine:275]  Labs:  Recent Labs  04/27/15 1832 04/28/15 0432  WBC 9.4 7.8  HGB 8.5* 8.0*  PLT 332 316  CREATININE 2.22* 1.66*   Estimated Creatinine Clearance: 42.7 mL/min (by C-G formula based on Cr of 1.66). No results for input(s): VANCOTROUGH, VANCOPEAK, VANCORANDOM, GENTTROUGH, GENTPEAK, GENTRANDOM, TOBRATROUGH, TOBRAPEAK, TOBRARND, AMIKACINPEAK, AMIKACINTROU, AMIKACIN in the last 72 hours.   Microbiology: Recent Results (from the past 720 hour(s))  Surgical pcr screen     Status: Abnormal   Collection Time: 04/15/15  7:55 AM  Result Value Ref Range Status   MRSA, PCR NEGATIVE NEGATIVE Final   Staphylococcus aureus POSITIVE (A) NEGATIVE Final    Comment:        The Xpert SA Assay (FDA approved for NASAL specimens in patients over 78 years of age), is one component of a comprehensive surveillance program.  Test performance has been validated by [facility] for patients greater than or equal to 74 year old. It is not intended to diagnose infection nor to guide or monitor treatment.   Urine culture     Status: None   Collection Time: 04/27/15  7:59 PM  Result Value Ref Range Status   Specimen Description URINE, CATHETERIZED  Final   Special Requests NONE  Final   Culture NO GROWTH 1 DAY  Final   Report Status 04/28/2015 FINAL  Final  Culture, blood (x 2)     Status: None (Preliminary result)   Collection Time: 04/27/15 10:00 PM  Result Value Ref  Range Status   Specimen Description BLOOD LEFT ARM  Final   Special Requests BOTTLES DRAWN AEROBIC AND ANAEROBIC 2CC  Final   Culture  Setup Time   Final    GRAM POSITIVE COCCI IN CLUSTERS ANAEROBIC BOTTLE ONLY CRITICAL RESULT CALLED TO, READ BACK BY AND VERIFIED WITH: Silvestre Moment RN [id number] 04/28/15 A BROWNING    Culture NO GROWTH < 24 HOURS  Final   Report Status PENDING  Incomplete  Culture, blood (x 2)     Status: None (Preliminary result)   Collection Time: 04/27/15 10:15 PM  Result Value Ref Range Status   Specimen Description BLOOD RIGHT ARM  Final   Special Requests BOTTLES DRAWN AEROBIC AND ANAEROBIC 5CC  Final   Culture NO GROWTH < 24 HOURS  Final   Report Status PENDING  Incomplete    Medical History: Past Medical History  Diagnosis Date  . Carotid artery occlusion   . Hypertension   . Hyperlipidemia   . Arthritis   . Stroke [facility]) 1998 and  1999  . Ulcer     Hiatal Hernia / Gastric ulcers  . History of vagotomy   . Chronic diarrhea   . Fall against object Oct. 16, 2015    Pt got tangled up with dog chain out side in the woods.  . Diabetes mellitus 2006    TYPE 2    Medications:  Med History pending  Assessment: 79 yo M [facility] after a C4-5 fusion surgery (04/13/15 > 04/22/15).  Since that time pt has had increased weakness, AMS.  Currently on antibiotics for UTI.  WBC wnl, SCr down to 1.66 (SCr was 1 last admission), afebrile. 1/2 BCx growing gram positive cocci in clusters. Pharmacy consulted to start IV vancomycin as empiric coverage for r/o bacteremia.   PMH: HTN, HLD, CVA, EtOH use, hyponatremia  Goal of Therapy:  Vancomycin trough level 15-20 mcg/ml  Plan:  -Start Vancomycin 750 mg IV Q 12 hours -Decrease Ceftriaxone to 2 gm IV Q 24 hours for UTI -Monitor CBC, renal fx, cultures and clinical progress -VT at [facility]    Vinnie Level, PharmD., BCPS Clinical Pharmacist Pager [phone]

## 2015-04-28 NOTE — Clinical Social Work Note (Addendum)
CSW has attempted to reach family to complete assessment. CSW notes that patient was admitted from [facility]. CSW will attempt to assess patient/family at a later time.    UPDATE: CSW made another attempt to assess patient at 4:54PM. Patient is confused. CSW needs to speak with patient's wife to complete assessment.     Roddie Mc MSW, [place], [place], [phone]

## 2015-04-28 NOTE — Progress Notes (Addendum)
[facility] PROGRESS NOTE  Charles Bridges ZOX:[id number] DOB: 01-10-1936 DOA: 04/27/2015 PCP: Ailene Ravel, MD  Assessment/Plan:  Principal Problem:   Acute encephalopathy: Seems improved today. Likely secondary to UTI, acute renal failure/dehydration and pain medications. Continue IV fluids but change to half-normal saline, as sodium is high today. Continue Rocephin. Doubt CNS infection. Will stop vancomycin and ampicillin and drop upper Cashion's. Continue Rocephin for UTI. Pain medications held for now. Will schedule Tylenol. CT brain without anything acute. Active Problems:   Carotid stenosis   Essential hypertension   HLD (hyperlipidemia)   History of CVA (cerebrovascular accident)   Alcohol abuse: Doubt withdrawal, as patient too far out from last drink, but would continue thiamine.   AKI (acute kidney injury) (HCC): Likely secondary to prerenal azotemia. Continue hydration and monitor.   Chronic indwelling Foley catheter   UTI (urinary tract infection): See above Central cord syndrome:  S/p C3,4,5,and C6 laminectomies Posterior arthrodesis C3-6, morselized allograft by Dr. Mikal Plane last hospitalization. Patient has 0 out of 5 movement on his left upper extremity and about 1 out of 5 left lower extremity. Right sided strength is about 3-4 out of 5. Will get MRI of C-spine with contrast to evaluate the postoperative area. Also get MRI of brain without contrast to rule out stroke. Will sedate with low-dose Haldol prior to studies and hopefully patient can stay still. Macrocytic anemia: B-12 and folate okay. No reported bleeding. Will heme occult stools and check iron TIBC and ferritin.  Code Status:  full Family Communication:   Disposition Plan:  Eventually, back to SNF  Consultants:    Procedures:     Antibiotics:  rocephin  HPI/Subjective: Complains of rib pain. Has to go to the bathroom. No headache.  Objective: Filed Vitals:   04/28/15 0820  BP: 132/37   Pulse: 64  Temp: 99.6 F (37.6 C)  Resp: 20    Intake/Output Summary (Last 24 hours) at 04/28/15 1029 Last data filed at 04/28/15 0834  Gross per 24 hour  Intake      0 ml  Output   1700 ml  Net  -1700 ml   Filed Weights   04/27/15 1742 04/27/15 2323  Weight: 99.791 kg (220 lb) 94.303 kg (207 lb 14.4 oz)    Exam:   General:  Alert. Mostly appropriate. Answers questions and follows commands, but somewhat agitated.  HEENT: Aspen collar in place  Cardiovascular: Regular rate rhythm without murmurs gallops rubs  Respiratory: Clear to auscultation bilaterally without wheezes rhonchi or rales   Abdomen: Soft nontender nondistended  Ext: No clubbing cyanosis or edema  Neurologic: No cranial nerve deficits. Poor effort. Left upper extremity strength 0 out of 5. Left lower [place] he strength about 1-2 out of 5. Right upper and lower extremity strength about 3 out of 5.  Psychiatric: Occasionally agitated but overall, cooperative.  Basic Metabolic Panel:  Recent Labs Lab 04/27/15 1832 04/28/15 0432  NA 141 146*  K 4.3 4.3  CL 107 113*  CO2 22 23  GLUCOSE 142* 150*  BUN 97* 82*  CREATININE 2.22* 1.66*  CALCIUM 8.5* 8.6*  MG 3.2*  --    Liver Function Tests:  Recent Labs Lab 04/27/15 1832 04/28/15 0432  AST 41 51*  ALT 50 53  ALKPHOS 101 101  BILITOT 1.1 1.2  PROT 6.1* 6.1*  ALBUMIN 2.6* 2.4*   No results for input(s): LIPASE, AMYLASE in the last 168 hours.  Recent Labs Lab 04/28/15 0432  AMMONIA 18  CBC:  Recent Labs Lab 04/27/15 1832 04/28/15 0432  WBC 9.4 7.8  NEUTROABS 7.8* 7.0  HGB 8.5* 8.0*  HCT 26.4* 25.5*  MCV 101.9* 102.4*  PLT 332 316   Cardiac Enzymes: No results for input(s): CKTOTAL, CKMB, CKMBINDEX, TROPONINI in the last 168 hours. BNP (last 3 results) No results for input(s): BNP in the last 8760 hours.  ProBNP (last 3 results) No results for input(s): PROBNP in the last 8760 hours.  CBG:  Recent Labs Lab  04/27/15 1833  GLUCAP 120*    No results found for this or any previous visit (from the past 240 hour(s)).   Studies: Ct Head Wo Contrast  04/27/2015   CLINICAL DATA:  Weakness with altered mental status. Recent cervical fusion.  EXAM: CT HEAD WITHOUT CONTRAST  TECHNIQUE: Contiguous axial images were obtained from the base of the skull through the vertex without intravenous contrast.  COMPARISON:  CT head 04/13/2015.  FINDINGS: Scout view reveals recently placed posterior cervical fusion hardware. The integrity of the fusion and hardware not established with this CT head exam.  No evidence for acute infarction, hemorrhage, mass lesion, hydrocephalus, or extra-axial fluid. Moderate atrophy. Chronic microvascular ischemic change. Remote RIGHT basilar ganglia and BILATERAL thalamic lacunar infarcts. No occult subdural. No skull fracture. Advanced vascular calcification. Orbits, sinuses, mastoids unremarkable.  IMPRESSION: Chronic changes as described. No significant change from 04/13/2015.  No new areas of infarction, hemorrhage, or extra-axial fluid are observed.   Electronically Signed   By: Elsie Stain M.D.   On: 04/27/2015 18:27   Dg Chest Port 1 View  04/27/2015   CLINICAL DATA:  Altered mental status, shortness of breath  EXAM: PORTABLE CHEST 1 VIEW  COMPARISON:  04/17/2015  FINDINGS: There is mild bilateral chronic interstitial thickening. There is no focal parenchymal opacity. There is no pleural effusion or pneumothorax. The heart and mediastinal contours are unremarkable.  The osseous structures are unremarkable.  IMPRESSION: No active disease.   Electronically Signed   By: Elige Ko   On: 04/27/2015 18:52    Scheduled Meds: . acetaminophen  650 mg Oral TID  . aspirin  81 mg Oral Daily  . cefTRIAXone (ROCEPHIN)  IV  2 g Intravenous Q12H  . folic acid  1 mg Oral Daily  . latanoprost  1 drop Right Eye QHS  . metoprolol  25 mg Oral BID  . pantoprazole  40 mg Oral Daily  . pravastatin   40 mg Oral q1800  . senna  1 tablet Oral BID  . sodium chloride  3 mL Intravenous Q12H  . thiamine IV  100 mg Intravenous Once  . thiamine  100 mg Oral Daily   Continuous Infusions: . sodium chloride      Time spent: 35 minutes  Tenzin Pavon L  [facility]  [email or web address], password [facility] 04/28/2015, 10:29 AM  LOS: 1 day

## 2015-04-28 NOTE — Progress Notes (Signed)
Utilization review completed. Tyrika Newman, RN, BSN. 

## 2015-04-28 NOTE — Progress Notes (Addendum)
Positive Blood Cultures: Anaerobic Bottle grew Gram positive cocci and clusters. On call NP aware.    Second set Positive Blood Cultures: Aerobic Bottle grew Gram positive cocci and clusters. On call NP aware. Will continue to monitor.

## 2015-04-29 ENCOUNTER — Inpatient Hospital Stay (HOSPITAL_COMMUNITY): Payer: Medicare Other

## 2015-04-29 ENCOUNTER — Encounter (HOSPITAL_COMMUNITY): Payer: Self-pay | Admitting: Neurosurgery

## 2015-04-29 DIAGNOSIS — R7881 Bacteremia: Secondary | ICD-10-CM | POA: Insufficient documentation

## 2015-04-29 LAB — CBC
HCT: 25.2 % — ABNORMAL LOW (ref 39.0–52.0)
Hemoglobin: 8.2 g/dL — ABNORMAL LOW (ref 13.0–17.0)
MCH: 33.1 pg (ref 26.0–34.0)
MCHC: 32.5 g/dL (ref 30.0–36.0)
MCV: 101.6 fL — AB (ref 78.0–100.0)
PLATELETS: 304 10*3/uL (ref 150–400)
RBC: 2.48 MIL/uL — ABNORMAL LOW (ref 4.22–5.81)
RDW: 15 % (ref 11.5–15.5)
WBC: 6.3 10*3/uL (ref 4.0–10.5)

## 2015-04-29 LAB — BASIC METABOLIC PANEL
Anion gap: 6 (ref 5–15)
BUN: 43 mg/dL — AB (ref 6–20)
CALCIUM: 7.9 mg/dL — AB (ref 8.9–10.3)
CHLORIDE: 111 mmol/L (ref 101–111)
CO2: 22 mmol/L (ref 22–32)
CREATININE: 1.05 mg/dL (ref 0.61–1.24)
GFR calc Af Amer: >60 mL/min (ref >60)
GFR calc non Af Amer: >60 mL/min (ref >60)
GLUCOSE: 131 mg/dL — AB (ref 65–99)
Potassium: 3.5 mmol/L (ref 3.5–5.1)
Sodium: 139 mmol/L (ref 135–145)

## 2015-04-29 LAB — IRON AND TIBC
Iron: 29 ug/dL — ABNORMAL LOW (ref 45–182)
Saturation Ratios: 16 % — ABNORMAL LOW (ref 17.9–39.5)
TIBC: 182 ug/dL — ABNORMAL LOW (ref 250–450)
UIBC: 153 ug/dL

## 2015-04-29 LAB — FERRITIN: Ferritin: 673 ng/mL — ABNORMAL HIGH (ref 24–336)

## 2015-04-29 MED ORDER — MORPHINE SULFATE (PF) 2 MG/ML IV SOLN
1.0000 mg | INTRAVENOUS | Status: DC | PRN
  Administered 2015-05-01 – 2015-05-04 (×7): 1 mg via INTRAVENOUS
  Filled 2015-04-29 (×7): qty 1

## 2015-04-29 MED ORDER — POTASSIUM CHLORIDE IN NACL 20-0.9 MEQ/L-% IV SOLN
INTRAVENOUS | Status: DC
  Administered 2015-04-29: 75 mL/h via INTRAVENOUS
  Administered 2015-04-30 – 2015-05-04 (×6): via INTRAVENOUS
  Filled 2015-04-29 (×18): qty 1000

## 2015-04-29 NOTE — Clinical Social Work Note (Signed)
Clinical Social Work Assessment  Patient Details  Name: Charles Bridges MRN: [id number] Date of Birth: [birth year]  Date of referral:  04/29/15               Reason for consult:  Facility Placement                Permission sought to share information with:  Facility Sport and exercise psychologist, Family Supports Permission granted to share information::  Yes, Verbal Permission Granted  Name::     [place]  Agency::  [facility]  Relationship::  Wife  Contact Information:  [phone]  Housing/Transportation Living arrangements for the past 2 months:  [place] of Information:  Spouse Patient Interpreter Needed:  None Criminal Activity/Legal Involvement Pertinent to Current Situation/Hospitalization:  No - Comment as needed Significant Relationships:  Spouse Lives with:  Facility Resident Do you feel safe going back to the place where you live?  Yes Need for family participation in patient care:  Yes (Comment)  Care giving concerns: Patient's wife agrees that patient should return to SNF.   Social Worker assessment / plan:  BSW intern met with patient's wife in the patient room about discharge plans for patient. Patient's wife stated that patient had came from [place]. Patient's wife states that she would like the patient to return to [facility] for long-term rehab post- discharge. BSW intern stated that a Education officer, museum would continue to follow and assist as needed.  Employment status:  Retired Forensic scientist:  Medicare PT Recommendations:  [place] / Referral to community resources:  [place]  Patient/Family's Response to care:  Patient's wife seemed to be happy with the care that the patient was receiving at the hospital as well as the care received at [facility].  Patient/Family's Understanding of and Emotional Response to Diagnosis, Current Treatment, and Prognosis: Patient's wife was understanding of the situation and  responded well to the diagnosis, current treatment, and discharge needs.  Emotional Assessment Appearance:  Appears stated age Attitude/Demeanor/Rapport:  Unable to Assess (Out of the Room) Affect (typically observed):  Unable to Assess (Out of the room) Orientation:  Oriented to Self, Oriented to Place, Oriented to  Time Alcohol / Substance use:  Not Applicable Psych involvement (Current and /or in the community):  No (Comment)  Discharge Needs  Concerns to be addressed:  Discharge Planning Concerns Readmission within the last 30 days:  No Current discharge risk:  None Barriers to Discharge:  Continued Medical Work up  [employer], [phone]

## 2015-04-29 NOTE — Progress Notes (Signed)
Addendum: pt unable to cooperate with MRI. Required MRI with anesthesia last admission. Will order MRI under anesthesia. Needs NPO after midnight. I happened to see Dr. Satira Anis in elevator and discussed with him.  [name], MD [facility]

## 2015-04-29 NOTE — Progress Notes (Signed)
[facility] PROGRESS NOTE  Charles Bridges EAV:[id number] DOB: May 21, 1936 DOA: 04/27/2015 PCP: Ailene Ravel, MD  Summary 79 year old white male sent from nursing home with encephalopathy and left-sided weakness. Had a recent hospitalization after a fall. At that time, was found to have C-spine is soft tissue injury and fracture of anterior C3-4 osteophyte with stenosis noted and cord edema.  On 9/20, had C3, 4, 5 and 6 laminectomies with posterior arthrodesis of C3-6, morselized allograft by Dr. Mikal Plane.  In the emergency room, found to be in acute renal failure and have a urinary tract infection. CT brain was negative for anything acute. MRI of brain and neck was ordered 10/3. Apparently patient is unable to fit in the smaller MRI scanner. Larger scanner is down but should be up and running today. Patient has developed 2 out of 2 positive blood cultures gram-positive cocci in clusters.  Assessment/Plan:  Principal Problem:   Acute encephalopathy: improving. Multifactorial, secondary to infection, pain medication, dehydration. Currently on Tylenol only. Would be very cautious if resuming opiate analgesics or any other sedating medications  Active Problems: Bacteremia: gram-positive cocci in clusters 2/2. Vancomycin resumed. I examined his neck today. No drainage or cellulitis. Awaiting MRI scan. Notified Dr. Mikal Plane of readmission. We will officially consult if MRI concerning for infection. If staph aureus, would need ID consult as well.    UTI (urinary tract infection): culture negative. Continue Rocephin for now  Central cord syndrome:  S/p C3,4,5,and C6 laminectomies Posterior arthrodesis C3-6, morselized allograft by Dr. Mikal Plane last hospitalization. Patient has 1 out of 5 movement on his left upper extremity and about 1-2 out of 5 left lower extremity. Right sided strength is about 3-4 out of 5. MRI scanner down. Discussed with MRI attack should be running by noon today. See above.  Discussed with Dr. Jerral Ralph, discharging physician last admission.    AKI (acute kidney injury) (HCC): secondary to prerenal azotemia. Resolving. Continue hydration.  Macrocytic anemia: B-12 and folate okay. No reported bleeding. Will heme occult stools. relatively low iron and TIBC with high ferritin suggestive of chronic disease. Monitor.    Carotid stenosis    Essential hypertension    HLD (hyperlipidemia)    History of CVA (cerebrovascular accident)    Alcohol abuse: Doubt withdrawal, as patient too far out from last drink, but would continue thiamine.    Chronic indwelling Foley catheter  Deconditioning: Hold off on PT OT consults until MRI scans done.  Hypernatremia corrected.  Code Status:  full Family Communication:  None available Disposition Plan:  Eventually, back to SNF  Consultants:    Procedures:     Antibiotics: Rocephin, vancomycin  HPI/Subjective: Complains of having difficulty eating due to upper extremity weakness.  Objective: Filed Vitals:   04/29/15 0426  BP: 143/49  Pulse: 61  Temp: 98.7 F (37.1 C)  Resp: 15    Intake/Output Summary (Last 24 hours) at 04/29/15 0859 Last data filed at 04/29/15 0453  Gross per 24 hour  Intake    850 ml  Output   2150 ml  Net  -1300 ml   Filed Weights   04/27/15 1742 04/27/15 2323 04/29/15 0426  Weight: 99.791 kg (220 lb) 94.303 kg (207 lb 14.4 oz) 95.391 kg (210 lb 4.8 oz)    Exam:   General:  Alert. Mostly appropriate. Answers questions and follows commands, just got Haldol for attempted MRI scan.  HEENT: mucous membranes last stripe. No thrush.  Neck: Aspen collar in place. Dressing clean dry and intact.  No cellulitis or drainage.  Cardiovascular: Regular rate rhythm without murmurs gallops rubs  Respiratory: Clear to auscultation bilaterally without wheezes rhonchi or rales   Abdomen: Soft nontender nondistended  Ext: No clubbing cyanosis or edema  Neurologic: No cranial nerve  deficits. Poor effort. Left upper extremity strength 1 out of 5. Left lower extremity strength about 1-2 out of 5. Right upper and lower extremity strength about 3 out of 5.  Psychiatric: comment cooperative.  Basic Metabolic Panel:  Recent Labs Lab 04/27/15 1832 04/28/15 0432 04/29/15 0533  NA 141 146* 139  K 4.3 4.3 3.5  CL 107 113* 111  CO2 GLUCOSE 142* 150* 131*  BUN 97* 82* 43*  CREATININE 2.22* 1.66* 1.05  CALCIUM 8.5* 8.6* 7.9*  MG 3.2*  --   --    Liver Function Tests:  Recent Labs Lab 04/27/15 1832 04/28/15 0432  AST 41 51*  ALT 50 53  ALKPHOS 101 101  BILITOT 1.1 1.2  PROT 6.1* 6.1*  ALBUMIN 2.6* 2.4*   No results for input(s): LIPASE, AMYLASE in the last 168 hours.  Recent Labs Lab 04/28/15 0432  AMMONIA 18   CBC:  Recent Labs Lab 04/27/15 1832 04/28/15 0432 04/29/15 0533  WBC 9.4 7.8 6.3  NEUTROABS 7.8* 7.0  --   HGB 8.5* 8.0* 8.2*  HCT 26.4* 25.5* 25.2*  MCV 101.9* 102.4* 101.6*  PLT 332 316 304   Cardiac Enzymes: No results for input(s): CKTOTAL, CKMB, CKMBINDEX, TROPONINI in the last 168 hours. BNP (last 3 results) No results for input(s): BNP in the last 8760 hours.  ProBNP (last 3 results) No results for input(s): PROBNP in the last 8760 hours.  CBG:  Recent Labs Lab 04/27/15 1833  GLUCAP 120*    Recent Results (from the past 240 hour(s))  Urine culture     Status: None   Collection Time: 04/27/15  7:59 PM  Result Value Ref Range Status   Specimen Description URINE, CATHETERIZED  Final   Special Requests NONE  Final   Culture NO GROWTH 1 DAY  Final   Report Status 04/28/2015 FINAL  Final  Culture, blood (x 2)     Status: None (Preliminary result)   Collection Time: 04/27/15 10:00 PM  Result Value Ref Range Status   Specimen Description BLOOD LEFT ARM  Final   Special Requests BOTTLES DRAWN AEROBIC AND ANAEROBIC 2CC  Final   Culture  Setup Time   Final    GRAM POSITIVE COCCI IN CLUSTERS IN BOTH AEROBIC AND  ANAEROBIC BOTTLES CRITICAL RESULT CALLED TO, READ BACK BY AND VERIFIED WITH: Silvestre Moment RN 2133 04/28/15 A BROWNING    Culture TOO YOUNG TO READ  Final   Report Status PENDING  Incomplete  Culture, blood (x 2)     Status: None (Preliminary result)   Collection Time: 04/27/15 10:15 PM  Result Value Ref Range Status   Specimen Description BLOOD RIGHT ARM  Final   Special Requests BOTTLES DRAWN AEROBIC AND ANAEROBIC 5CC  Final   Culture  Setup Time   Final    GRAM POSITIVE COCCI IN CLUSTERS BOTTLES DRAWN AEROBIC ONLY CRITICAL RESULT CALLED TO, READ BACK BY AND VERIFIED WITH: M CRAVEN  04/29/15 MKELLY    Culture TOO YOUNG TO READ  Final   Report Status PENDING  Incomplete     Studies: Ct Head Wo Contrast  04/27/2015   CLINICAL DATA:  Weakness with altered mental status. Recent cervical fusion.  EXAM: CT HEAD  WITHOUT CONTRAST  TECHNIQUE: Contiguous axial images were obtained from the base of the skull through the vertex without intravenous contrast.  COMPARISON:  CT head 04/13/2015.  FINDINGS: Scout view reveals recently placed posterior cervical fusion hardware. The integrity of the fusion and hardware not established with this CT head exam.  No evidence for acute infarction, hemorrhage, mass lesion, hydrocephalus, or extra-axial fluid. Moderate atrophy. Chronic microvascular ischemic change. Remote RIGHT basilar ganglia and BILATERAL thalamic lacunar infarcts. No occult subdural. No skull fracture. Advanced vascular calcification. Orbits, sinuses, mastoids unremarkable.  IMPRESSION: Chronic changes as described. No significant change from 04/13/2015.  No new areas of infarction, hemorrhage, or extra-axial fluid are observed.   Electronically Signed   By: Elsie Stain M.D.   On: 04/27/2015 18:27   Dg Chest Port 1 View  04/27/2015   CLINICAL DATA:  Altered mental status, shortness of breath  EXAM: PORTABLE CHEST 1 VIEW  COMPARISON:  04/17/2015  FINDINGS: There is mild bilateral chronic  interstitial thickening. There is no focal parenchymal opacity. There is no pleural effusion or pneumothorax. The heart and mediastinal contours are unremarkable.  The osseous structures are unremarkable.  IMPRESSION: No active disease.   Electronically Signed   By: Elige Ko   On: 04/27/2015 18:52    Scheduled Meds: . acetaminophen  650 mg Oral TID  . antiseptic oral rinse  7 mL Mouth Rinse BID  . aspirin  81 mg Oral Daily  . cefTRIAXone (ROCEPHIN)  IV  2 g Intravenous Q24H  . folic acid  1 mg Oral Daily  . latanoprost  1 drop Right Eye QHS  . metoprolol  25 mg Oral BID  . pantoprazole  40 mg Oral Daily  . pravastatin  40 mg Oral q1800  . senna  1 tablet Oral BID  . sodium chloride  3 mL Intravenous Q12H  . thiamine IV  100 mg Intravenous Once  . thiamine  100 mg Oral Daily  . vancomycin  750 mg Intravenous Q12H   Continuous Infusions: . sodium chloride 100 mL/hr at 04/29/15 0345    Time spent: 35 minutes  Gracelee Stemmler L  [facility]  [email or web address], password [facility] 04/29/2015, 8:59 AM  LOS: 2 days

## 2015-04-30 ENCOUNTER — Inpatient Hospital Stay (HOSPITAL_COMMUNITY): Payer: Medicare Other

## 2015-04-30 ENCOUNTER — Inpatient Hospital Stay (HOSPITAL_COMMUNITY): Payer: Medicare Other | Admitting: Anesthesiology

## 2015-04-30 ENCOUNTER — Encounter (HOSPITAL_COMMUNITY)
Admission: EM | Disposition: A | Discharge status: Skilled Nursing Facility | Payer: Self-pay | Source: Home / Self Care | Attending: Internal Medicine

## 2015-04-30 DIAGNOSIS — M6289 Other specified disorders of muscle: Secondary | ICD-10-CM

## 2015-04-30 DIAGNOSIS — S14123S Central cord syndrome at C3 level of cervical spinal cord, sequela: Secondary | ICD-10-CM

## 2015-04-30 DIAGNOSIS — R7881 Bacteremia: Secondary | ICD-10-CM

## 2015-04-30 HISTORY — PX: RADIOLOGY WITH ANESTHESIA: SHX6223

## 2015-04-30 LAB — BASIC METABOLIC PANEL
ANION GAP: 11 (ref 5–15)
BUN: 25 mg/dL — ABNORMAL HIGH (ref 6–20)
CALCIUM: 8.5 mg/dL — AB (ref 8.9–10.3)
CO2: 20 mmol/L — AB (ref 22–32)
Chloride: 111 mmol/L (ref 101–111)
Creatinine, Ser: 0.83 mg/dL (ref 0.61–1.24)
Glucose, Bld: 117 mg/dL — ABNORMAL HIGH (ref 65–99)
Potassium: 4.1 mmol/L (ref 3.5–5.1)
SODIUM: 142 mmol/L (ref 135–145)

## 2015-04-30 LAB — CBC
HEMATOCRIT: 27.3 % — AB (ref 39.0–52.0)
Hemoglobin: 9 g/dL — ABNORMAL LOW (ref 13.0–17.0)
MCH: 32.8 pg (ref 26.0–34.0)
MCHC: 33 g/dL (ref 30.0–36.0)
MCV: 99.6 fL (ref 78.0–100.0)
PLATELETS: 322 10*3/uL (ref 150–400)
RBC: 2.74 MIL/uL — ABNORMAL LOW (ref 4.22–5.81)
RDW: 14.6 % (ref 11.5–15.5)
WBC: 6.3 10*3/uL (ref 4.0–10.5)

## 2015-04-30 LAB — VANCOMYCIN, TROUGH: VANCOMYCIN TR: 14 ug/mL (ref 10.0–20.0)

## 2015-04-30 SURGERY — RADIOLOGY WITH ANESTHESIA
Anesthesia: General | Laterality: Left

## 2015-04-30 MED ORDER — AMLODIPINE BESYLATE 5 MG PO TABS
5.0000 mg | ORAL_TABLET | Freq: Every day | ORAL | Status: DC
  Administered 2015-04-30 – 2015-05-05 (×6): 5 mg via ORAL
  Filled 2015-04-30 (×7): qty 1

## 2015-04-30 MED ORDER — ONDANSETRON HCL 4 MG/2ML IJ SOLN: 4.0000 mg | Freq: Once | INTRAMUSCULAR | Status: DC | PRN

## 2015-04-30 MED ORDER — VANCOMYCIN HCL IN DEXTROSE 1-5 GM/200ML-% IV SOLN
1000.0000 mg | Freq: Two times a day (BID) | INTRAVENOUS | Status: DC
  Administered 2015-04-30 – 2015-05-05 (×10): 1000 mg via INTRAVENOUS
  Filled 2015-04-30 (×11): qty 200

## 2015-04-30 MED ORDER — LACTATED RINGERS IV SOLN
INTRAVENOUS | Status: DC
  Administered 2015-04-30: 50 mL/h via INTRAVENOUS

## 2015-04-30 MED ORDER — GADOBENATE DIMEGLUMINE 529 MG/ML IV SOLN
20.0000 mL | Freq: Once | INTRAVENOUS | Status: AC | PRN
  Administered 2015-04-30: 20 mL via INTRAVENOUS

## 2015-04-30 MED ORDER — FENTANYL CITRATE (PF) 100 MCG/2ML IJ SOLN: 25.0000 ug | INTRAMUSCULAR | Status: DC | PRN

## 2015-04-30 NOTE — Transfer of Care (Signed)
Immediate Anesthesia Transfer of Care Note  Patient: Charles Bridges  Procedure(s) Performed: Procedure(s) with comments: MRI BRAIN WITHOUT CONTRAST, MRI CERVICAL SPINE WITH AND WITHOUT CONTRAST (Left) - DR. SULLIVAN/MRI  Patient Location: PACU  Anesthesia Type:General  Level of Consciousness: awake, alert  and confused (confused at baseline)  Airway & Oxygen Therapy: Patient Spontanous Breathing and Patient connected to nasal cannula oxygen  Post-op Assessment: Report given to RN and Post -op Vital signs reviewed and stable  Post vital signs: Reviewed and stable  Last Vitals:  Filed Vitals:   04/30/15 1630  BP: 148/56  Pulse:   Temp:   Resp: 29    Complications: No apparent anesthesia complications

## 2015-04-30 NOTE — Anesthesia Preprocedure Evaluation (Addendum)
Anesthesia Evaluation  Patient identified by MRN, date of birth, ID band Patient confused    Reviewed: Allergy & Precautions, NPO status , Patient's Chart, lab work & pertinent test results  Airway Mallampati: I  TM Distance: >3 FB Neck ROM: Full    Dental no notable dental hx. (+) Edentulous Lower, Edentulous Upper, Dental Advisory Given   Pulmonary former smoker,    Pulmonary exam normal breath sounds clear to auscultation       Cardiovascular hypertension, + Peripheral Vascular Disease  Normal cardiovascular exam Rhythm:Regular Rate:Normal     Neuro/Psych TIACVA negative neurological ROS  negative psych ROS   GI/Hepatic negative GI ROS, Neg liver ROS,   Endo/Other  negative endocrine ROSdiabetes, Type 2obese  Renal/GU negative Renal ROS     Musculoskeletal negative musculoskeletal ROS (+) Arthritis ,   Abdominal   Peds  Hematology negative hematology ROS (+)   Anesthesia Other Findings Patient with 3 level cervical laminectomy and fusion now presents and is re-admitted with acute encephalopathy that is improving but needs MRI to eval for possible infection in cervical region. Required GA for MRI last time. In c-collar  Reproductive/Obstetrics negative OB ROS                            Anesthesia Physical  Anesthesia Plan  ASA: III  Anesthesia Plan: General   Post-op Pain Management:    Induction: Intravenous  Airway Management Planned: Oral ETT  Additional Equipment: None  Intra-op Plan:   Post-operative Plan: Extubation in OR  Informed Consent: I have reviewed the patients History and Physical, chart, labs and discussed the procedure including the risks, benefits and alternatives for the proposed anesthesia with the patient or authorized representative who has indicated his/her understanding and acceptance.   Dental advisory given  Plan Discussed with: CRNA,  Anesthesiologist and Surgeon  Anesthesia Plan Comments:        Anesthesia Quick Evaluation                                  Anesthesia Evaluation  Patient identified by MRN, date of birth, ID band Patient awake    Reviewed: Allergy & Precautions, NPO status , Patient's Chart, lab work & pertinent test results  Airway Mallampati: II  TM Distance: >3 FB Neck ROM: Full    Dental no notable dental hx.    Pulmonary neg pulmonary ROS, former smoker,    Pulmonary exam normal breath sounds clear to auscultation       Cardiovascular hypertension, + Peripheral Vascular Disease  Normal cardiovascular exam Rhythm:Regular Rate:Normal     Neuro/Psych CVA negative psych ROS   GI/Hepatic negative GI ROS, (+)     substance abuse  alcohol use, BAC 0.17 upon admission to outside hospital   Endo/Other  negative endocrine ROSdiabetes  Renal/GU negative Renal ROS  negative genitourinary   Musculoskeletal negative musculoskeletal ROS (+)   Abdominal   Peds negative pediatric ROS (+)  Hematology negative hematology ROS (+)   Anesthesia Other Findings   Reproductive/Obstetrics negative OB ROS                            Anesthesia Physical Anesthesia Plan  ASA: III  Anesthesia Plan: General   Post-op Pain Management:    Induction: Intravenous  Airway Management Planned: LMA  Additional Equipment:  Intra-op Plan:   Post-operative Plan: Extubation in OR  Informed Consent: I have reviewed the patients History and Physical, chart, labs and discussed the procedure including the risks, benefits and alternatives for the proposed anesthesia with the patient or authorized representative who has indicated his/her understanding and acceptance.   Dental advisory given  Plan Discussed with: CRNA and Surgeon  Anesthesia Plan Comments: (BAC 0.17 upon admission to outside hospital)       Anesthesia Quick Evaluation

## 2015-04-30 NOTE — Progress Notes (Signed)
PROGRESS NOTE  Charles Bridges ZOX:[id number] DOB: [birth year] DOA: 04/27/2015 PCP: Ailene Ravel, MD  HPI: 79 year old white male sent from nursing home with encephalopathy and left-sided weakness. Had a recent hospitalization after a fall. At that time, was found to have C-spine is soft tissue injury and fracture of anterior C3-4 osteophyte with stenosis noted and cord edema. On 9/20, had C3, 4, 5 and 6 laminectomies with posterior arthrodesis of C3-6, morselized allograft by Dr. Mikal Plane. In the emergency room, found to be in acute renal failure and have a urinary tract infection. CT brain was negative for anything acute.   Subjective / 24 H Interval events - no chest pain, shortness of breath, no abdominal pain, nausea or vomiting.  - appears coherent however has some underlying confusion which is subtle  Assessment/Plan: Principal Problem:   Acute encephalopathy Active Problems:   Carotid stenosis   Essential hypertension   HLD (hyperlipidemia)   History of CVA (cerebrovascular accident)   Alcohol abuse   AKI (acute kidney injury) (HCC)   Chronic indwelling Foley catheter   UTI (urinary tract infection)   Central cord syndrome at C3 level of cervical spinal cord (HCC)   Left-sided weakness   Anemia   Bacteremia   Gram-positive cocci bacteremia  Acute encephalopathy  - improving. Multifactorial, secondary to infection, pain medication, dehydration. Currently on Tylenol only. Would be very cautious if resuming opiate analgesics or any other sedating medications - MRI pending, will be done with sedation  GPC Bacteremia  - gram-positive cocci in clusters 2/2, with one bottle growing coag negative staph. Awaiting the second culture - on vancomycin - will send surveillance cultures - awaiting MRI, recent C spine surgery as below - No drainage or cellulitis at surgical site.  - consult neurosurgery based on MRI findings  UTI (urinary tract infection) - culture negative.  discontinue Rocephin  Central cord syndrome - S/p C3,4,5,and C6 laminectomies, Posterior arthrodesis C3-6, morselized allograft by Dr. Mikal Plane last hospitalization on 04/15/2015 - Patient has 1 out of 5 movement on his left upper extremity and about 1-2 out of 5 left lower extremity. Right sided strength is about 3-4 out of 5.   AKI (acute kidney injury) - secondary to prerenal azotemia. Resolving. Continue hydration.  Macrocytic anemia - B-12 and folate okay - No reported bleeding. Will heme occult stools. relatively low iron and TIBC with high ferritin suggestive of chronic disease. Monitor.  Carotid stenosis  Essential hypertension - hypertensive, lisinopril/HCTZ held due to renal failure, start Norvasc  HLD (hyperlipidemia)  History of CVA (cerebrovascular accident)  Alcohol abuse - Doubt withdrawal, as patient too far out from last drink, but would continue thiamine.  Chronic indwelling Foley catheter - change foley  Deconditioning - Hold off on PT OT consults until MRI scans done.  Hypernatremia  - corrected with fluids   Diet: Diet NPO time specified Fluids: NS DVT Prophylaxis: SCD  Code Status: Full Code Family Communication: no family bedside  Disposition Plan: SNF when ready   Consultants:  None   Procedures:  None    Antibiotics  Anti-infectives    Start     Dose/Rate Route Frequency Ordered Stop   04/29/15 1000  [MAR Hold]  cefTRIAXone (ROCEPHIN) 2 g in dextrose 5 % 50 mL IVPB     (MAR Hold since 04/30/15 1102)   2 g 100 mL/hr over 30 Minutes Intravenous Every 24 hours 04/28/15 2157     04/28/15 2200  [MAR Hold]  vancomycin (VANCOCIN) IVPB 750  mg/150 ml premix     (MAR Hold since 04/30/15 1102)   750 mg 150 mL/hr over 60 Minutes Intravenous Every 12 hours 04/28/15 2157     04/28/15 0000  vancomycin (VANCOCIN) 1,250 mg in sodium chloride 0.9 % 250 mL IVPB  Status:  Discontinued     1,250 mg 166.7 mL/hr over 90 Minutes Intravenous Every 24 hours  04/27/15 2146 04/28/15 1020   04/27/15 2200  cefTRIAXone (ROCEPHIN) 2 g in dextrose 5 % 50 mL IVPB  Status:  Discontinued     2 g 100 mL/hr over 30 Minutes Intravenous Every 12 hours 04/27/15 2146 04/28/15 2157   04/27/15 2200  ampicillin (OMNIPEN) 2 g in sodium chloride 0.9 % 50 mL IVPB  Status:  Discontinued     2 g 150 mL/hr over 20 Minutes Intravenous 3 times per day 04/27/15 2146 04/28/15 1020       Studies  No results found.  Objective  Filed Vitals:   04/29/15 2359 04/30/15 0627 04/30/15 0807 04/30/15 1020  BP: 151/50 152/59 195/49 152/53  Pulse: 65 65 74 89  Temp: 98.2 F (36.8 C) 98.3 F (36.8 C) 98.2 F (36.8 C)   TempSrc: Oral Oral Oral   Resp: Height:      Weight:      SpO2: 100% 97% 98%     Intake/Output Summary (Last 24 hours) at 04/30/15 1401 Last data filed at 04/30/15 1023  Gross per 24 hour  Intake    303 ml  Output   2025 ml  Net  -1722 ml   Filed Weights   04/27/15 1742 04/27/15 2323 04/29/15 0426  Weight: 99.791 kg (220 lb) 94.303 kg (207 lb 14.4 oz) 95.391 kg (210 lb 4.8 oz)    Exam:  GENERAL: NAD  HEENT: head NCAT, no scleral icterus. Pupils round and reactive. Mucous membranes are moist. Posterior pharynx clear of any exudate or lesions.  NECK: Supple. Collar in place, dressing CDI  LUNGS: Clear to auscultation. No wheezing or crackles  HEART: Regular rate and rhythm without murmur. 2+ pulses, no JVD, no peripheral edema  ABDOMEN: Soft, nontender, and nondistended. Positive bowel sounds.   EXTREMITIES: Without any cyanosis, clubbing, rashes  NEUROLOGIC: left sided weakness 1/5, 4/5 on right.  PSYCHIATRIC: Normal mood and affect  Data Reviewed: Basic Metabolic Panel:  Recent Labs Lab 04/27/15 1832 04/28/15 0432 04/29/15 0533 04/30/15 0715  NA 141 146* 139 142  K 4.3 4.3 3.5 4.1  CL 107 113* 111 111  CO2 20*  GLUCOSE 142* 150* 131* 117*  BUN 97* 82* 43* 25*  CREATININE 2.22* 1.66* 1.05 0.83    CALCIUM 8.5* 8.6* 7.9* 8.5*  MG 3.2*  --   --   --    Liver Function Tests:  Recent Labs Lab 04/27/15 1832 04/28/15 0432  AST 41 51*  ALT 50 53  ALKPHOS 101 101  BILITOT 1.1 1.2  PROT 6.1* 6.1*  ALBUMIN 2.6* 2.4*   No results for input(s): LIPASE, AMYLASE in the last 168 hours.  Recent Labs Lab 04/28/15 0432  AMMONIA 18   CBC:  Recent Labs Lab 04/27/15 1832 04/28/15 0432 04/29/15 0533 04/30/15 0715  WBC 9.4 7.8 6.3 6.3  NEUTROABS 7.8* 7.0  --   --   HGB 8.5* 8.0* 8.2* 9.0*  HCT 26.4* 25.5* 25.2* 27.3*  MCV 101.9* 102.4* 101.6* 99.6  PLT 332 316 304 322   CBG:  Recent Labs Lab 04/27/15 1833  GLUCAP 120*    Recent Results (from the past 240 hour(s))  Urine culture     Status: None   Collection Time: 04/27/15  7:59 PM  Result Value Ref Range Status   Specimen Description URINE, CATHETERIZED  Final   Special Requests NONE  Final   Culture NO GROWTH 1 DAY  Final   Report Status 04/28/2015 FINAL  Final  Culture, blood (x 2)     Status: None (Preliminary result)   Collection Time: 04/27/15 10:00 PM  Result Value Ref Range Status   Specimen Description BLOOD LEFT ARM  Final   Special Requests BOTTLES DRAWN AEROBIC AND ANAEROBIC 2CC  Final   Culture  Setup Time   Final    GRAM POSITIVE COCCI IN CLUSTERS IN BOTH AEROBIC AND ANAEROBIC BOTTLES CRITICAL RESULT CALLED TO, READ BACK BY AND VERIFIED WITH: Silvestre Moment RN 2133 04/28/15 A BROWNING    Culture   Final    STAPHYLOCOCCUS SPECIES (COAGULASE NEGATIVE) SUSCEPTIBILITIES TO FOLLOW    Report Status PENDING  Incomplete  Culture, blood (x 2)     Status: None (Preliminary result)   Collection Time: 04/27/15 10:15 PM  Result Value Ref Range Status   Specimen Description BLOOD RIGHT ARM  Final   Special Requests BOTTLES DRAWN AEROBIC AND ANAEROBIC 5CC  Final   Culture  Setup Time   Final    GRAM POSITIVE COCCI IN CLUSTERS BOTTLES DRAWN AEROBIC ONLY CRITICAL RESULT CALLED TO, READ BACK BY AND VERIFIED WITH: M  CRAVEN @0154  04/29/15 MKELLY    Culture TOO YOUNG TO READ  Final   Report Status PENDING  Incomplete     Scheduled Meds: . [MAR Hold] acetaminophen  650 mg Oral TID  . [MAR Hold] antiseptic oral rinse  7 mL Mouth Rinse BID  . [MAR Hold] aspirin  81 mg Oral Daily  . [MAR Hold] cefTRIAXone (ROCEPHIN)  IV  2 g Intravenous Q24H  . [MAR Hold] folic acid  1 mg Oral Daily  . [MAR Hold] latanoprost  1 drop Right Eye QHS  . [MAR Hold] metoprolol  25 mg Oral BID  . [MAR Hold] pantoprazole  40 mg Oral Daily  . [MAR Hold] pravastatin  40 mg Oral q1800  . [MAR Hold] senna  1 tablet Oral BID  . [MAR Hold] sodium chloride  3 mL Intravenous Q12H  . [MAR Hold] thiamine  100 mg Oral Daily  . [MAR Hold] vancomycin  750 mg Intravenous Q12H   Continuous Infusions: . 0.9 % NaCl with KCl 20 mEq / L 75 mL/hr at 04/30/15 0800  . lactated ringers 50 mL/hr (04/30/15 1115)    Pamella Pert, MD [facility] Pager [phone]. If 7 PM - 7 AM, please contact night-coverage at [email or web address], password [facility] 04/30/2015, 2:01 PM  LOS: 3 days

## 2015-04-30 NOTE — Anesthesia Postprocedure Evaluation (Signed)
Anesthesia Post Note  Patient: Charles Bridges  Procedure(s) Performed: Procedure(s) (LRB): MRI BRAIN WITHOUT CONTRAST, MRI CERVICAL SPINE WITH AND WITHOUT CONTRAST (Left)  Anesthesia type: general  Patient location: PACU  Post pain: Pain level controlled  Post assessment: Patient's Cardiovascular Status Stable  Last Vitals:  Filed Vitals:   04/30/15 1720  BP: 150/62  Pulse: 63  Temp:   Resp: 23    Post vital signs: Reviewed and stable  Level of consciousness: sedated  Complications: No apparent anesthesia complications

## 2015-04-30 NOTE — Progress Notes (Signed)
ANTIBIOTIC CONSULT NOTE - FOLLOW UP  Pharmacy Consult for Vancomycin Indication: bacteremia  No Known Allergies  Patient Measurements: Height: 6' 1.5" (186.7 cm) Weight: 210 lb 4.8 oz (95.391 kg) IBW/kg (Calculated) : 81.05  Vital Signs: Temp: 98.8 F (37.1 C) (10/05 2032) Temp Source: Oral (10/05 2032) BP: 147/46 mmHg (10/05 2224) Pulse Rate: 62 (10/05 2224) Intake/Output from previous day: 10/04 0701 - 10/05 0700 In: 423 [P.O.:420; I.V.:3] Out: 1150 [Urine:1150] Intake/Output from this shift:    Labs:  Recent Labs  04/28/15 0432 04/29/15 0533 04/30/15 0715  WBC 7.8 6.3 6.3  HGB 8.0* 8.2* 9.0*  PLT 316 304 322  CREATININE 1.66* 1.05 0.83   Estimated Creatinine Clearance: 85.5 mL/min (by C-G formula based on Cr of 0.83).  Recent Labs  04/30/15 2139  VANCOTROUGH 14      Assessment: 79 yo M recently discharged from [facility] after a C4-5 fusion surgery (04/13/15 > 04/22/15). Since that time pt has had increased weakness, AMS. Currently on antibiotics for UTI. WBC 9.4, SCr 2.22 (SCr was 1 last admission), afebrile  Infectious Disease- MD Doubt CNS infection , continue Rocephin as acute encephalopathy likely 2/2 UTI, urine cx neg & MD aware/notes to cont CTX for now; Chronic indwelling Foley catheter. Vanc resumed 10/3 PM for r/o bacteremia, Bld x2 GPC (one shows CNS) clusters. MD concerned possibly d/t infection S/p C3,4,5,and C6 laminectomies. Dr. Hessie Knows his neck today. No drainage or cellulitis. Awaiting MRI scan to have done under anesthesia today 10/5  - VANC dose at 750 mg q12h , WBC 6.3, afebrile, SCr improved 2.22 >1.66> 1.05>0.83 improved with hydration , CrCl ~ 53ml/min. Vanco trough 14  Rocephin 10/2>>  Vanc 10/2>>10/3; 10/3>>  Ampicillin 10/2>>10/3   10/2 Bld x2 GPC clusters 2/2 , 1 or 2 Staph coag neg  10/2 Urine neg /final   Goal of Therapy:  Vancomycin trough level 15-20 mcg/ml  Plan:  Increase Vancomycin slightly to 1g IV q12h.  Jenaro Souder  S. Merilynn Finland, PharmD, BCPS Clinical Staff Pharmacist Pager [phone]  Pasty Spillers 04/30/2015,10:39 PM

## 2015-04-30 NOTE — Anesthesia Procedure Notes (Signed)
Procedure Name: Intubation Date/Time: 04/30/2015 4:43 PM Performed by: Daiva Eves Pre-anesthesia Checklist: Patient identified, Emergency Drugs available, Suction available, Patient being monitored and Timeout performed Patient Re-evaluated:Patient Re-evaluated prior to inductionOxygen Delivery Method: Circle system utilized Preoxygenation: Pre-oxygenation with 100% oxygen Intubation Type: IV induction Laryngoscope Size: Mac and 4 Grade View: Grade I Tube type: Oral Tube size: 7.5 mm Number of attempts: 1 Airway Equipment and Method: Stylet Placement Confirmation: ETT inserted through vocal cords under direct vision,  positive ETCO2,  CO2 detector and breath sounds checked- equal and bilateral Secured at: 22 cm Tube secured with: Tape Dental Injury: Teeth and Oropharynx as per pre-operative assessment  Comments: C-collar remained intact and neck remained in midline position during induction and intubation.

## 2015-05-01 ENCOUNTER — Encounter (HOSPITAL_COMMUNITY): Payer: Self-pay | Admitting: Radiology

## 2015-05-01 DIAGNOSIS — L97429 Non-pressure chronic ulcer of left heel and midfoot with unspecified severity: Secondary | ICD-10-CM

## 2015-05-01 DIAGNOSIS — M549 Dorsalgia, unspecified: Secondary | ICD-10-CM

## 2015-05-01 DIAGNOSIS — R7881 Bacteremia: Secondary | ICD-10-CM | POA: Diagnosis present

## 2015-05-01 DIAGNOSIS — Z960 Presence of urogenital implants: Secondary | ICD-10-CM

## 2015-05-01 DIAGNOSIS — G9340 Encephalopathy, unspecified: Secondary | ICD-10-CM

## 2015-05-01 DIAGNOSIS — L97419 Non-pressure chronic ulcer of right heel and midfoot with unspecified severity: Secondary | ICD-10-CM

## 2015-05-01 DIAGNOSIS — Z9889 Other specified postprocedural states: Secondary | ICD-10-CM

## 2015-05-01 DIAGNOSIS — L8990 Pressure ulcer of unspecified site, unspecified stage: Secondary | ICD-10-CM | POA: Insufficient documentation

## 2015-05-01 DIAGNOSIS — B957 Other staphylococcus as the cause of diseases classified elsewhere: Secondary | ICD-10-CM

## 2015-05-01 DIAGNOSIS — M542 Cervicalgia: Secondary | ICD-10-CM

## 2015-05-01 LAB — CULTURE, BLOOD (ROUTINE X 2)

## 2015-05-01 LAB — BASIC METABOLIC PANEL
Anion gap: 10 (ref 5–15)
BUN: 20 mg/dL (ref 6–20)
CHLORIDE: 114 mmol/L — AB (ref 101–111)
CO2: 21 mmol/L — ABNORMAL LOW (ref 22–32)
CREATININE: 0.91 mg/dL (ref 0.61–1.24)
Calcium: 8.3 mg/dL — ABNORMAL LOW (ref 8.9–10.3)
GFR calc Af Amer: >60 mL/min (ref >60)
GFR calc non Af Amer: >60 mL/min (ref >60)
GLUCOSE: 105 mg/dL — AB (ref 65–99)
POTASSIUM: 4.3 mmol/L (ref 3.5–5.1)
Sodium: 145 mmol/L (ref 135–145)

## 2015-05-01 LAB — CBC
HEMATOCRIT: 25 % — AB (ref 39.0–52.0)
Hemoglobin: 8.2 g/dL — ABNORMAL LOW (ref 13.0–17.0)
MCH: 33.2 pg (ref 26.0–34.0)
MCHC: 32.8 g/dL (ref 30.0–36.0)
MCV: 101.2 fL — AB (ref 78.0–100.0)
PLATELETS: 291 10*3/uL (ref 150–400)
RBC: 2.47 MIL/uL — ABNORMAL LOW (ref 4.22–5.81)
RDW: 14.8 % (ref 11.5–15.5)
WBC: 5.5 10*3/uL (ref 4.0–10.5)

## 2015-05-01 MED ORDER — RESOURCE THICKENUP CLEAR PO POWD
ORAL | Status: DC | PRN
  Filled 2015-05-01 (×2): qty 125

## 2015-05-01 NOTE — Consult Note (Addendum)
WOC wound consult note Reason for Consult: Consult for bilat heels.  Pt went to the OR on 10/5 and deep tissue injuries were noted afterwards Wound type: Deep tissue injuries to bilat heels and right achilies Pressure Ulcer POA: No Left heel 1.2X.2cm, dark red with intact skin Right heel 6X1cm, dark red with intact skin Right achilles 4X1cm, dark red with intact skin Dressing procedure/placement/frequency: Float heels to reduce pressure.  Foam dressings to protect from further injury.  Deep tissue injuries are high risk to evolve into full thickness wounds despite optimal plan of care.  No family present to discuss plan of care and patient does not appear to understand. Please re-consult if further assistance is needed.  Thank-you,  Cammie Mcgee MSN, RN, CWOCN, [place], CNS [phone]

## 2015-05-01 NOTE — Consult Note (Signed)
[facility]ious [facility]  Total days of antibiotics 5        Day 10/2->> vancomycin        Day 10/3->> ceftriaxone       Reason for Consult: Sepsis, encephalopathy, concern of operative site involvement    Referring Physician: Damien Fusi  Principal Problem:   Acute encephalopathy Active Problems:   Carotid stenosis   Essential hypertension   HLD (hyperlipidemia)   History of CVA (cerebrovascular accident)   Alcohol abuse   AKI (acute kidney injury) ([place])   Chronic indwelling Foley catheter   UTI (urinary tract infection)   Central cord syndrome at C3 level of cervical spinal cord (HCC)   Left-sided weakness   Anemia   Gram-positive cocci bacteremia   Pressure ulcer   Coagulase negative Staphylococcus bacteremia    HPI: Charles Bridges is a 79 y.o. male recently hospitalized 9/20-9/27 for fall with cervical spine fracture requiring fixation with complication of spinal cord syndrome and severe extremity weakness.He was readmitted 10/2 after becoming increasingly confused at the SNF for rehab. During that stay he also appears to have developed bilateral heel ulcers. He has a chronic indwelling foley catheter and initial UA obtained showed many bacteria but Urine Cx NGTD. His blood cultures from 10/2 however are positive for Coag negative staph. He has been on appropriate IV abtx for this with improvement of his mental state compared to what is described on admission.  Past Medical History  Diagnosis Date  . Carotid artery occlusion   . Hypertension   . Hyperlipidemia   . Arthritis   . Stroke [facility]) 1998 and  1999  . Ulcer     Hiatal Hernia / Gastric ulcers  . History of vagotomy   . Chronic diarrhea   . Fall against object Oct. 16, 2015    Pt got tangled up with dog chain out side in the woods.  . Diabetes mellitus 2006    TYPE 2    Allergies: No Known Allergies  Current antibiotics:   MEDICATIONS: . acetaminophen  650 mg Oral TID  . amLODipine  5  mg Oral Daily  . antiseptic oral rinse  7 mL Mouth Rinse BID  . aspirin  81 mg Oral Daily  . folic acid  1 mg Oral Daily  . latanoprost  1 drop Right Eye QHS  . metoprolol  25 mg Oral BID  . pantoprazole  40 mg Oral Daily  . pravastatin  40 mg Oral q1800  . senna  1 tablet Oral BID  . sodium chloride  3 mL Intravenous Q12H  . thiamine  100 mg Oral Daily  . vancomycin  1,000 mg Intravenous Q12H    Social History  Substance Use Topics  . Smoking status: Former Smoker    Types: Cigarettes    Quit date: 07/27/1999  . Smokeless tobacco: Never Used  . Alcohol Use: Yes     Comment: drinks 2 to 3 beers per night     Family History  Problem Relation Age of Onset  . Cancer Mother     breast cancer  . Other Mother     Circulation problems and lower extremity amputation  . Other Father     cerebrovascular accident    Review of Systems  Constitutional: Negative for fever.  Eyes: Negative for blurred vision.  Respiratory: Negative for cough.   Cardiovascular: Negative for chest pain and palpitations.  Gastrointestinal: Negative for nausea.  Genitourinary:  Chronic indwelling foley  Musculoskeletal: Positive for back pain and neck pain.  Skin: Negative for rash.  Neurological: Negative for dizziness and headaches.    OBJECTIVE: Temp:  [97.9 F (36.6 C)-98.8 F (37.1 C)] 98.1 F (36.7 C) (10/06 0449) Pulse Rate:  [57-65] 65 (10/06 1423) Resp:  [17-26] 20 (10/06 1624) BP: (122-153)/(39-59) 135/41 mmHg (10/06 1624) SpO2:  [96 %-100 %] 97 % (10/06 0423)   GENERAL- alert, oriented x3, co-operative, NAD HEENT- Atraumatic, PERRL, oral mucosa appears moist, dentures in place, hard of hearing, clean, closed incision at back of neck without drainage or surrounding erythema CARDIAC- RRR, no murmurs, rubs or gallops. RESP- CTAB, no wheezes or crackles. ABDOMEN- Soft, nontender, no guarding or rebound, slightly hyperactive bowel sounds present BACK- Normal curvature, no  paraspinal tenderness, no CVA tenderness. NEURO- No obvious Cr N abnormality, strength in L upper and b/l lower extremities is 2/5. Slightly better in flexion than extension. RUE strength is appropriate. EXTREMITIES- No pedal edema, bilateral partial thickness heel ulcers SKIN- Warm, dry, No rash or lesion. PSYCH- Labile mood and affect, appropriate thought content and speech.   LABS: Results for orders placed or performed during the hospital encounter of 04/27/15 (from the past 48 hour(s))  Basic metabolic panel     Status: Abnormal   Collection Time: 04/30/15  7:15 AM  Result Value Ref Range   Sodium 142 135 - 145 mmol/L   Potassium 4.1 3.5 - 5.1 mmol/L   Chloride 111 101 - 111 mmol/L   CO2 20 (L) 22 - 32 mmol/L   Glucose, Bld 117 (H) 65 - 99 mg/dL   BUN 25 (H) 6 - 20 mg/dL   Creatinine, Ser 0.83 0.61 - 1.24 mg/dL   Calcium 8.5 (L) 8.9 - 10.3 mg/dL   GFR calc non Af Amer >60 >60 mL/min   GFR calc Af Amer >60 >60 mL/min    Comment: (NOTE) The eGFR has been calculated using the CKD EPI equation. This calculation has not been validated in all clinical situations. eGFR's persistently <60 mL/min signify possible Chronic Kidney Disease.    Anion gap 11 5 - 15  CBC     Status: Abnormal   Collection Time: 04/30/15  7:15 AM  Result Value Ref Range   WBC 6.3 4.0 - 10.5 K/uL   RBC 2.74 (L) 4.22 - 5.81 MIL/uL   Hemoglobin 9.0 (L) 13.0 - 17.0 g/dL   HCT 27.3 (L) 39.0 - 52.0 %   MCV 99.6 78.0 - 100.0 fL   MCH 32.8 26.0 - 34.0 pg   MCHC 33.0 30.0 - 36.0 g/dL   RDW 14.6 11.5 - 15.5 %   Platelets 322 150 - 400 K/uL  Culture, blood (routine x 2)     Status: None (Preliminary result)   Collection Time: 04/30/15  6:55 PM  Result Value Ref Range   Specimen Description BLOOD LEFT HAND    Special Requests BOTTLES DRAWN AEROBIC AND ANAEROBIC 10CC    Culture NO GROWTH < 24 HOURS    Report Status PENDING   Culture, blood (routine x 2)     Status: None (Preliminary result)   Collection Time:  04/30/15  7:03 PM  Result Value Ref Range   Specimen Description BLOOD LEFT ANTECUBITAL    Special Requests BOTTLES DRAWN AEROBIC AND ANAEROBIC 10CC    Culture NO GROWTH < 24 HOURS    Report Status PENDING   Vancomycin, trough     Status: None   Collection Time: 04/30/15  9:39 PM  Result Value Ref Range   Vancomycin Tr 14 10.0 - 20.0 ug/mL  Basic metabolic panel     Status: Abnormal   Collection Time: 05/01/15  4:35 AM  Result Value Ref Range   Sodium 145 135 - 145 mmol/L   Potassium 4.3 3.5 - 5.1 mmol/L   Chloride 114 (H) 101 - 111 mmol/L   CO2 21 (L) 22 - 32 mmol/L   Glucose, Bld 105 (H) 65 - 99 mg/dL   BUN 20 6 - 20 mg/dL   Creatinine, Ser 0.91 0.61 - 1.24 mg/dL   Calcium 8.3 (L) 8.9 - 10.3 mg/dL   GFR calc non Af Amer >60 >60 mL/min   GFR calc Af Amer >60 >60 mL/min    Comment: (NOTE) The eGFR has been calculated using the CKD EPI equation. This calculation has not been validated in all clinical situations. eGFR's persistently <60 mL/min signify possible Chronic Kidney Disease.    Anion gap 10 5 - 15  CBC     Status: Abnormal   Collection Time: 05/01/15  4:35 AM  Result Value Ref Range   WBC 5.5 4.0 - 10.5 K/uL   RBC 2.47 (L) 4.22 - 5.81 MIL/uL   Hemoglobin 8.2 (L) 13.0 - 17.0 g/dL   HCT 25.0 (L) 39.0 - 52.0 %   MCV 101.2 (H) 78.0 - 100.0 fL   MCH 33.2 26.0 - 34.0 pg   MCHC 32.8 30.0 - 36.0 g/dL   RDW 14.8 11.5 - 15.5 %   Platelets 291 150 - 400 K/uL    MICRO:  IMAGING: Mr Brain Wo Contrast  04/30/2015   CLINICAL DATA:  Left sided weakness developed recently. Recent fracture at C3-4 treated with surgery on 04/15/2015.  EXAM: MRI HEAD WITHOUT CONTRAST  MRI CERVICAL SPINE WITHOUT CONTRAST  TECHNIQUE: Multiplanar, multiecho pulse sequences of the brain and surrounding structures, and cervical spine, to include the craniocervical junction and cervicothoracic junction, were obtained without intravenous contrast.  COMPARISON:  CT 04/27/2015.  MRI 04/15/2015.  MRI  04/14/2015.  FINDINGS: MRI HEAD FINDINGS  Diffusion imaging does not show any acute or subacute infarction. There are mild chronic small-vessel ischemic changes affecting the pons. No focal cerebellar insult. There is old infarction affecting both thalami I an the right basal ganglia. There chronic small-vessel ischemic changes affecting the hemispheric deep white matter. No large vessel territory infarction. No mass lesion, hemorrhage, hydrocephalus or extra-axial collection. No pituitary mass. Mild mucosal inflammatory changes affect the paranasal sinuses. Major vessels at the base of the brain show flow.  MRI CERVICAL SPINE FINDINGS  The patient has had interval posterior decompression and fusion from C3 through C6. Lateral mass hardware appears grossly well positioned as evaluated by MRI. There is fluid within the operative approach in the region behind C3 through C5. This could be ordinary postoperative seroma, but CSF leak is not excluded. This fluid collection does not appear to visibly compress the spinal cord. Edema remains evident in the C4-5 disc space consistent with a hyperextension injury. There is no evidence of epidural hematoma postoperatively.  IMPRESSION: MRI head: No acute infarction. Old small vessel Ing infarctions most notable in the right basal ganglia and both thalami.  MRI cervical spine: Interval posterior decompression and posterior fusion from C3 through C6. Fluid collection along the operative approach behind C3, C4 and C5 measuring approximately 5.6 x 5.2 by 3.0 cm. This could be postoperative seroma or could indicate CSF leak. No evidence of epidural hematoma.  Electronically Signed   By: Nelson Chimes M.D.   On: 04/30/2015 16:17   Mr Cervical Spine W Wo Contrast  04/30/2015   CLINICAL DATA:  Left sided weakness developed recently. Recent fracture at C3-4 treated with surgery on 04/15/2015.  EXAM: MRI HEAD WITHOUT CONTRAST  MRI CERVICAL SPINE WITHOUT CONTRAST  TECHNIQUE:  Multiplanar, multiecho pulse sequences of the brain and surrounding structures, and cervical spine, to include the craniocervical junction and cervicothoracic junction, were obtained without intravenous contrast.  COMPARISON:  CT 04/27/2015.  MRI 04/15/2015.  MRI 04/14/2015.  FINDINGS: MRI HEAD FINDINGS  Diffusion imaging does not show any acute or subacute infarction. There are mild chronic small-vessel ischemic changes affecting the pons. No focal cerebellar insult. There is old infarction affecting both thalami I an the right basal ganglia. There chronic small-vessel ischemic changes affecting the hemispheric deep white matter. No large vessel territory infarction. No mass lesion, hemorrhage, hydrocephalus or extra-axial collection. No pituitary mass. Mild mucosal inflammatory changes affect the paranasal sinuses. Major vessels at the base of the brain show flow.  MRI CERVICAL SPINE FINDINGS  The patient has had interval posterior decompression and fusion from C3 through C6. Lateral mass hardware appears grossly well positioned as evaluated by MRI. There is fluid within the operative approach in the region behind C3 through C5. This could be ordinary postoperative seroma, but CSF leak is not excluded. This fluid collection does not appear to visibly compress the spinal cord. Edema remains evident in the C4-5 disc space consistent with a hyperextension injury. There is no evidence of epidural hematoma postoperatively.  IMPRESSION: MRI head: No acute infarction. Old small vessel Ing infarctions most notable in the right basal ganglia and both thalami.  MRI cervical spine: Interval posterior decompression and posterior fusion from C3 through C6. Fluid collection along the operative approach behind C3, C4 and C5 measuring approximately 5.6 x 5.2 by 3.0 cm. This could be postoperative seroma or could indicate CSF leak. No evidence of epidural hematoma.   Electronically Signed   By: Nelson Chimes M.D.   On: 04/30/2015  16:17    HISTORICAL MICRO/IMAGING  Assessment/Plan:  Coag negative staph bacteremia Encephalopathy appears to be improving. Empiric therapy was covering susceptibilities and recommend proceeding with just vancomycin. At this time seems like infection may be bloodstream without other source involvement. Surgical site looks good on physical exam, with no surrounding erythema or dehiscence. Heel ulcers and foley catheter are also concerning for sources. Probably not foley as it was not for a prolonged time and urine is not growing similar bacteria. Could get another weigh in on fluid collection at surgical site if there is failure to clear cultures, seemingly current thought is seroma only. -D/c ceftriaxone, continue vancomycin -F/U repeat blood Cx from 10/5 x2, may be cleared if there is not source control issue. If not clear workup may need to be expanded further    Hinton Lovely Internal Medicine Resident PGY-I Pager: [phone]

## 2015-05-01 NOTE — Clinical Social Work Note (Addendum)
Clinical Social Worker continuing to follow patient and family for support and discharge planning needs.  Patient is from [facility] as a short term resident.  Per facility admissions coordinator, [facility] authorization will need to be received prior to patient admission.  CSW updated facility on patient current status and will initiate authorization with [facility] upon completion of therapy evaluations.  CSW remains available for support and to facilitate patient discharge needs.  Macario Golds, [place] [phone]

## 2015-05-01 NOTE — Progress Notes (Signed)
PROGRESS NOTE  Charles Bridges WUJ:[id number] DOB: [birth year] DOA: 04/27/2015 PCP: Ailene Ravel, MD  HPI: 79 year old white male sent from nursing home with encephalopathy and left-sided weakness. Had a recent hospitalization after a fall. At that time, was found to have C-spine is soft tissue injury and fracture of anterior C3-4 osteophyte with stenosis noted and cord edema. On 9/20, had C3, 4, 5 and 6 laminectomies with posterior arthrodesis of C3-6, morselized allograft by Dr. Mikal Plane. In the emergency room, found to be in acute renal failure and have a urinary tract infection. CT brain was negative for anything acute.   Subjective / 24 H Interval events - states he "doesn't feel good" but cannot elaborate - no longer confused, has no specific complaints  Assessment/Plan: Principal Problem:   Acute encephalopathy Active Problems:   Carotid stenosis   Essential hypertension   HLD (hyperlipidemia)   History of CVA (cerebrovascular accident)   Alcohol abuse   AKI (acute kidney injury) (HCC)   Chronic indwelling Foley catheter   UTI (urinary tract infection)   Central cord syndrome at C3 level of cervical spinal cord (HCC)   Left-sided weakness   Anemia   Gram-positive cocci bacteremia   Pressure ulcer   Acute encephalopathy  - improving. Multifactorial, secondary to infection, pain medication, dehydration. Currently on Tylenol only. Would be very cautious if resuming opiate analgesics or any other sedating medications - MRI as below, no acute findings  Coag negative staph bacteremia on cultures on 10/2 - on vancomycin - surveillance cultures sent 10/5, NGTD - MRI as below, no apparent abscess - No drainage or cellulitis at surgical site - ID consulted today, appreciate input  UTI (urinary tract infection) - culture negative. discontinue Rocephin  Central cord syndrome - S/p C3,4,5,and C6 laminectomies, Posterior arthrodesis C3-6, morselized allograft by Dr. Mikal Plane  last hospitalization on 04/15/2015 - Patient has 1 out of 5 movement on his left upper extremity and about 1-2 out of 5 left lower extremity. Right sided strength is about 3-4 out of 5.  - repeat MRI on 10/5 with small fluid collection at surgical site, discussed with Dr. Franky Macho and he reviewed images, it appears that this is expected post op seroma.  - off collar per Dr. Franky Macho as well  AKI (acute kidney injury) - secondary to prerenal azotemia. Resolving. Continue hydration.  Macrocytic anemia - B-12 and folate okay - No reported bleeding. Will heme occult stools. relatively low iron and TIBC with high ferritin suggestive of chronic disease. Monitor.  Carotid stenosis  Essential hypertension - hypertensive, lisinopril/HCTZ held due to renal failure, start Norvasc  HLD (hyperlipidemia)  History of CVA (cerebrovascular accident)  Alcohol abuse - Doubt withdrawal, as patient too far out from last drink, but would continue thiamine.  Chronic indwelling Foley catheter - change foley  Deconditioning - PT consult  Hypernatremia  - corrected with fluids   Diet: Diet 2 gram sodium Room service appropriate?: Yes; Fluid consistency:: Thin Fluids: NS DVT Prophylaxis: SCD  Code Status: Full Code Family Communication: no family bedside  Disposition Plan: SNF when ready   Consultants:  None   Procedures:  None    Antibiotics  Anti-infectives    Start     Dose/Rate Route Frequency Ordered Stop   04/30/15 2330  vancomycin (VANCOCIN) IVPB 1000 mg/200 mL premix     1,000 mg 200 mL/hr over 60 Minutes Intravenous Every 12 hours 04/30/15 2244     04/29/15 1000  cefTRIAXone (ROCEPHIN) 2 g in dextrose  5 % 50 mL IVPB     2 g 100 mL/hr over 30 Minutes Intravenous Every 24 hours 04/28/15 2157     04/28/15 2200  vancomycin (VANCOCIN) IVPB 750 mg/150 ml premix  Status:  Discontinued     750 mg 150 mL/hr over 60 Minutes Intravenous Every 12 hours 04/28/15 2157 04/30/15 2244    04/28/15 0000  vancomycin (VANCOCIN) 1,250 mg in sodium chloride 0.9 % 250 mL IVPB  Status:  Discontinued     1,250 mg 166.7 mL/hr over 90 Minutes Intravenous Every 24 hours 04/27/15 2146 04/28/15 1020   04/27/15 2200  cefTRIAXone (ROCEPHIN) 2 g in dextrose 5 % 50 mL IVPB  Status:  Discontinued     2 g 100 mL/hr over 30 Minutes Intravenous Every 12 hours 04/27/15 2146 04/28/15 2157   04/27/15 2200  ampicillin (OMNIPEN) 2 g in sodium chloride 0.9 % 50 mL IVPB  Status:  Discontinued     2 g 150 mL/hr over 20 Minutes Intravenous 3 times per day 04/27/15 2146 04/28/15 1020      Studies  Mr Brain Wo Contrast  04/30/2015   CLINICAL DATA:  Left sided weakness developed recently. Recent fracture at C3-4 treated with surgery on 04/15/2015.  EXAM: MRI HEAD WITHOUT CONTRAST  MRI CERVICAL SPINE WITHOUT CONTRAST  TECHNIQUE: Multiplanar, multiecho pulse sequences of the brain and surrounding structures, and cervical spine, to include the craniocervical junction and cervicothoracic junction, were obtained without intravenous contrast.  COMPARISON:  CT 04/27/2015.  MRI 04/15/2015.  MRI 04/14/2015.  FINDINGS: MRI HEAD FINDINGS  Diffusion imaging does not show any acute or subacute infarction. There are mild chronic small-vessel ischemic changes affecting the pons. No focal cerebellar insult. There is old infarction affecting both thalami I an the right basal ganglia. There chronic small-vessel ischemic changes affecting the hemispheric deep white matter. No large vessel territory infarction. No mass lesion, hemorrhage, hydrocephalus or extra-axial collection. No pituitary mass. Mild mucosal inflammatory changes affect the paranasal sinuses. Major vessels at the base of the brain show flow.  MRI CERVICAL SPINE FINDINGS  The patient has had interval posterior decompression and fusion from C3 through C6. Lateral mass hardware appears grossly well positioned as evaluated by MRI. There is fluid within the operative  approach in the region behind C3 through C5. This could be ordinary postoperative seroma, but CSF leak is not excluded. This fluid collection does not appear to visibly compress the spinal cord. Edema remains evident in the C4-5 disc space consistent with a hyperextension injury. There is no evidence of epidural hematoma postoperatively.  IMPRESSION: MRI head: No acute infarction. Old small vessel Ing infarctions most notable in the right basal ganglia and both thalami.  MRI cervical spine: Interval posterior decompression and posterior fusion from C3 through C6. Fluid collection along the operative approach behind C3, C4 and C5 measuring approximately 5.6 x 5.2 by 3.0 cm. This could be postoperative seroma or could indicate CSF leak. No evidence of epidural hematoma.   Electronically Signed   By: Paulina Fusi M.D.   On: 04/30/2015 16:17   Mr Cervical Spine W Wo Contrast  04/30/2015   CLINICAL DATA:  Left sided weakness developed recently. Recent fracture at C3-4 treated with surgery on 04/15/2015.  EXAM: MRI HEAD WITHOUT CONTRAST  MRI CERVICAL SPINE WITHOUT CONTRAST  TECHNIQUE: Multiplanar, multiecho pulse sequences of the brain and surrounding structures, and cervical spine, to include the craniocervical junction and cervicothoracic junction, were obtained without intravenous contrast.  COMPARISON:  CT  04/27/2015.  MRI 04/15/2015.  MRI 04/14/2015.  FINDINGS: MRI HEAD FINDINGS  Diffusion imaging does not show any acute or subacute infarction. There are mild chronic small-vessel ischemic changes affecting the pons. No focal cerebellar insult. There is old infarction affecting both thalami I an the right basal ganglia. There chronic small-vessel ischemic changes affecting the hemispheric deep white matter. No large vessel territory infarction. No mass lesion, hemorrhage, hydrocephalus or extra-axial collection. No pituitary mass. Mild mucosal inflammatory changes affect the paranasal sinuses. Major vessels at the  base of the brain show flow.  MRI CERVICAL SPINE FINDINGS  The patient has had interval posterior decompression and fusion from C3 through C6. Lateral mass hardware appears grossly well positioned as evaluated by MRI. There is fluid within the operative approach in the region behind C3 through C5. This could be ordinary postoperative seroma, but CSF leak is not excluded. This fluid collection does not appear to visibly compress the spinal cord. Edema remains evident in the C4-5 disc space consistent with a hyperextension injury. There is no evidence of epidural hematoma postoperatively.  IMPRESSION: MRI head: No acute infarction. Old small vessel Ing infarctions most notable in the right basal ganglia and both thalami.  MRI cervical spine: Interval posterior decompression and posterior fusion from C3 through C6. Fluid collection along the operative approach behind C3, C4 and C5 measuring approximately 5.6 x 5.2 by 3.0 cm. This could be postoperative seroma or could indicate CSF leak. No evidence of epidural hematoma.   Electronically Signed   By: Paulina Fusi M.D.   On: 04/30/2015 16:17    Objective  Filed Vitals:   05/01/15 0224 05/01/15 0423 05/01/15 0449 05/01/15 0828  BP: 123/44 122/46  140/49  Pulse: 59 58    Temp:   98.1 F (36.7 C)   TempSrc:   Oral   Resp: 20 26  17   Height:      Weight:      SpO2: 98% 97%      Intake/Output Summary (Last 24 hours) at 05/01/15 1422 Last data filed at 05/01/15 0830  Gross per 24 hour  Intake    360 ml  Output   1025 ml  Net   -665 ml   Filed Weights   04/27/15 1742 04/27/15 2323 04/29/15 0426  Weight: 99.791 kg (220 lb) 94.303 kg (207 lb 14.4 oz) 95.391 kg (210 lb 4.8 oz)    Exam:  GENERAL: NAD  HEENT: head NCAT, no scleral icterus. Pupils round and reactive.  NECK: Supple. Collar in place, dressing CDI  LUNGS: Clear to auscultation. No wheezing or crackles  HEART: Regular rate and rhythm without murmur. 2+ pulses, no JVD, no peripheral  edema  ABDOMEN: Soft, nontender, and nondistended. Positive bowel sounds.   EXTREMITIES: Without any cyanosis, clubbing, rashes  NEUROLOGIC: left sided weakness 1/5, stronger on right.  PSYCHIATRIC: Normal mood and affect   Data Reviewed: Basic Metabolic Panel:  Recent Labs Lab 04/27/15 1832 04/28/15 0432 04/29/15 0533 04/30/15 0715 05/01/15 0435  NA 141 146* 139 142 145  K 4.3 4.3 3.5 4.1 4.3  CL 107 113* 111 111 114*  CO2 22 23 22  20* 21*  GLUCOSE 142* 150* 131* 117* 105*  BUN 97* 82* 43* 25* 20  CREATININE 2.22* 1.66* 1.05 0.83 0.91  CALCIUM 8.5* 8.6* 7.9* 8.5* 8.3*  MG 3.2*  --   --   --   --    Liver Function Tests:  Recent Labs Lab 04/27/15 1832 04/28/15 0432  AST  41 51*  ALT 50 53  ALKPHOS 101 101  BILITOT 1.1 1.2  PROT 6.1* 6.1*  ALBUMIN 2.6* 2.4*    Recent Labs Lab 04/28/15 0432  AMMONIA 18   CBC:  Recent Labs Lab 04/27/15 1832 04/28/15 0432 04/29/15 0533 04/30/15 0715 05/01/15 0435  WBC 9.4 7.8 6.3 6.3 5.5  NEUTROABS 7.8* 7.0  --   --   --   HGB 8.5* 8.0* 8.2* 9.0* 8.2*  HCT 26.4* 25.5* 25.2* 27.3* 25.0*  MCV 101.9* 102.4* 101.6* 99.6 101.2*  PLT 332 316 304 322 291   CBG:  Recent Labs Lab 04/27/15 1833  GLUCAP 120*    Recent Results (from the past 240 hour(s))  Urine culture     Status: None   Collection Time: 04/27/15  7:59 PM  Result Value Ref Range Status   Specimen Description URINE, CATHETERIZED  Final   Special Requests NONE  Final   Culture NO GROWTH 1 DAY  Final   Report Status 04/28/2015 FINAL  Final  Culture, blood (x 2)     Status: None   Collection Time: 04/27/15 10:00 PM  Result Value Ref Range Status   Specimen Description BLOOD LEFT ARM  Final   Special Requests BOTTLES DRAWN AEROBIC AND ANAEROBIC 2CC  Final   Culture  Setup Time   Final    GRAM POSITIVE COCCI IN CLUSTERS IN BOTH AEROBIC AND ANAEROBIC BOTTLES CRITICAL RESULT CALLED TO, READ BACK BY AND VERIFIED WITH: Silvestre Moment RN 2133 04/28/15 A  BROWNING    Culture STAPHYLOCOCCUS SPECIES (COAGULASE NEGATIVE)  Final   Report Status 05/01/2015 FINAL  Final   Organism ID, Bacteria STAPHYLOCOCCUS SPECIES (COAGULASE NEGATIVE)  Final      Susceptibility   Staphylococcus species (coagulase negative) - MIC*    CIPROFLOXACIN >=8 RESISTANT Resistant     ERYTHROMYCIN >=8 RESISTANT Resistant     GENTAMICIN 8 INTERMEDIATE Intermediate     OXACILLIN >=4 RESISTANT Resistant     TETRACYCLINE 2 SENSITIVE Sensitive     VANCOMYCIN 2 SENSITIVE Sensitive     TRIMETH/SULFA 80 RESISTANT Resistant     CLINDAMYCIN >=8 RESISTANT Resistant     RIFAMPIN <=0.5 SENSITIVE Sensitive     Inducible Clindamycin NEGATIVE Sensitive     * STAPHYLOCOCCUS SPECIES (COAGULASE NEGATIVE)  Culture, blood (x 2)     Status: None   Collection Time: 04/27/15 10:15 PM  Result Value Ref Range Status   Specimen Description BLOOD RIGHT ARM  Final   Special Requests BOTTLES DRAWN AEROBIC AND ANAEROBIC 5CC  Final   Culture  Setup Time   Final    GRAM POSITIVE COCCI IN CLUSTERS BOTTLES DRAWN AEROBIC ONLY CRITICAL RESULT CALLED TO, READ BACK BY AND VERIFIED WITH: M CRAVEN  04/29/15 MKELLY    Culture   Final    STAPHYLOCOCCUS SPECIES (COAGULASE NEGATIVE) SUSCEPTIBILITIES PERFORMED ON PREVIOUS CULTURE WITHIN THE LAST 5 DAYS.    Report Status 05/01/2015 FINAL  Final  Culture, blood (routine x 2)     Status: None (Preliminary result)   Collection Time: 04/30/15  6:55 PM  Result Value Ref Range Status   Specimen Description BLOOD LEFT HAND  Final   Special Requests BOTTLES DRAWN AEROBIC AND ANAEROBIC 10CC  Final   Culture NO GROWTH < 24 HOURS  Final   Report Status PENDING  Incomplete  Culture, blood (routine x 2)     Status: None (Preliminary result)   Collection Time: 04/30/15  7:03 PM  Result Value Ref Range Status  Specimen Description BLOOD LEFT ANTECUBITAL  Final   Special Requests BOTTLES DRAWN AEROBIC AND ANAEROBIC 10CC  Final   Culture NO GROWTH < 24 HOURS   Final   Report Status PENDING  Incomplete     Scheduled Meds: . acetaminophen  650 mg Oral TID  . amLODipine  5 mg Oral Daily  . antiseptic oral rinse  7 mL Mouth Rinse BID  . aspirin  81 mg Oral Daily  . cefTRIAXone (ROCEPHIN)  IV  2 g Intravenous Q24H  . folic acid  1 mg Oral Daily  . latanoprost  1 drop Right Eye QHS  . metoprolol  25 mg Oral BID  . pantoprazole  40 mg Oral Daily  . pravastatin  40 mg Oral q1800  . senna  1 tablet Oral BID  . sodium chloride  3 mL Intravenous Q12H  . thiamine  100 mg Oral Daily  . vancomycin  1,000 mg Intravenous Q12H    Continuous Infusions: . 0.9 % NaCl with KCl 20 mEq / L 75 mL/hr at 04/30/15 1800  . lactated ringers 50 mL/hr (04/30/15 1115)    Pamella Pert, MD [facility] Pager [phone]. If 7 PM - 7 AM, please contact night-coverage at [email or web address], password [facility] 05/01/2015, 2:22 PM  LOS: 4 days

## 2015-05-01 NOTE — Care Management Important Message (Signed)
Important Message  Patient Details  Name: Charles Bridges MRN: [id number] Date of Birth: [birth year]   Medicare Important Message Given:  Yes-second notification given    Kyla Balzarine 05/01/2015, 11:22 AM

## 2015-05-01 NOTE — Evaluation (Signed)
Clinical/Bedside Swallow Evaluation Patient Details  Name: Charles Bridges MRN: [id number] Date of Birth: [birth year]  Today's Date: 05/01/2015 Time: SLP Start Time (ACUTE ONLY): 1523 SLP Stop Time (ACUTE ONLY): 1553 SLP Time Calculation (min) (ACUTE ONLY): 30 min  Past Medical History:  Past Medical History  Diagnosis Date  . Carotid artery occlusion   . Hypertension   . Hyperlipidemia   . Arthritis   . Stroke [facility]) 1998 and  1999  . Ulcer     Hiatal Hernia / Gastric ulcers  . History of vagotomy   . Chronic diarrhea   . Fall against object Oct. 16, 2015    Pt got tangled up with dog chain out side in the woods.  . Diabetes mellitus 2006    TYPE 2   Past Surgical History:  Past Surgical History  Procedure Laterality Date  . Carotid endarterectomy  03/02/2000    Right CEA  . Fracture surgery      Right Shoulder  . Fracture right leg    . Radiology with anesthesia N/A 04/15/2015    Procedure: MRI CERVICAL SPINE WITHOUT CONTRAST    (RADIOLOGY WITH ANESTHESIA);  Surgeon: Medication Radiologist, MD;  Location: [facility] OR;  Service: Radiology;  Laterality: N/A;  . Posterior cervical fusion/foraminotomy N/A 04/15/2015    Procedure: POSTERIOR CERVICAL FUSION/FORAMINOTOMY LEVEL 3;  Surgeon: Coletta Memos, MD;  Location: [facility] OR;  Service: Neurosurgery;  Laterality: N/A;  C3-6 posterior cervical arthrodesis with instrumentation  . Radiology with anesthesia Left 04/30/2015    Procedure: MRI BRAIN WITHOUT CONTRAST, MRI CERVICAL SPINE WITH AND WITHOUT CONTRAST;  Surgeon: Medication Radiologist, MD;  Location: [facility] OR;  Service: Radiology;  Laterality: Left;  DR. Derwood Kaplan   HPI:  79 year old white male sent from nursing home with encephalopathy and left-sided weakness. Had a recent hospitalization after a fall. At that time, was found to have C-spine is soft tissue injury and fracture of anterior C3-4 osteophyte with stenosis noted and cord edema. On 9/20, had C3, 4, 5 and 6 laminectomies with  posterior arthrodesis of C3-6, morselized allograft by Dr. Mikal Plane. In the emergency room, found to be in acute renal failure and have a urinary tract infection. CT brain was negative for anything acute. PMH of CVA, alcohol abuse. FEES complete 04/22/15 and recommended dysphagia 3 with nectar thick liquids.    Assessment / Plan / Recommendation Clinical Impression  Swallow evealuation complete. Results at bedside fairly consistent with results of FEES 9/27 in which patient presented with cough response with thin liquids only, although appearing related to delay in swallow initiation vs residuals today. Recommend repetition of instrumental testing to establish new baseline swallowing function and determine effectiveness of compensatory strategies for airway protection with thin liquids.     Aspiration Risk  Mild    Diet Recommendation Dysphagia 3 (Mech soft);Nectar   Medication Administration: Crushed with puree Compensations: Minimize environmental distractions;Slow rate;Small sips/bites    Other  Recommendations Oral Care Recommendations: Oral care BID Other Recommendations: Order thickener from pharmacy;Prohibited food (jello, ice cream, thin soups);Remove water pitcher         Pertinent Vitals/Pain n/a        Swallow Study    General Other Pertinent Information: 79 year old white male sent from nursing home with encephalopathy and left-sided weakness. Had a recent hospitalization after a fall. At that time, was found to have C-spine is soft tissue injury and fracture of anterior C3-4 osteophyte with stenosis noted and cord edema. On 9/20, had C3, 4, 5 and  6 laminectomies with posterior arthrodesis of C3-6, morselized allograft by Dr. Mikal Plane. In the emergency room, found to be in acute renal failure and have a urinary tract infection. CT brain was negative for anything acute. PMH of CVA, alcohol abuse. FEES complete 04/22/15 and recommended dysphagia 3 with nectar thick liquids.  Type  of Study: Bedside swallow evaluation Previous Swallow Assessment: see HPI Diet Prior to this Study: Regular;NPO Temperature Spikes Noted: No Respiratory Status: Room air History of Recent Intubation: No Behavior/Cognition: Alert;Cooperative;Pleasant mood Oral Cavity - Dentition:  (dentures) Self-Feeding Abilities: Total assist Patient Positioning: Upright in bed Baseline Vocal Quality: Normal Volitional Cough: Strong Volitional Swallow: Able to elicit    Oral/Motor/Sensory Function Overall Oral Motor/Sensory Function: Appears within functional limits for tasks assessed   Ice Chips Ice chips: Not tested   Thin Liquid Thin Liquid: Impaired Presentation: Cup Pharyngeal  Phase Impairments: Suspected delayed Swallow;Cough - Immediate    Nectar Thick Nectar Thick Liquid: Within functional limits Presentation: Cup   Honey Thick Honey Thick Liquid: Not tested   Puree Puree: Within functional limits Presentation: Spoon   Solid   GO   Charles Ems MA, CCC-SLP [phone]  Solid: Within functional limits       Charles Bridges Meryl 05/01/2015,4:00 PM

## 2015-05-02 ENCOUNTER — Inpatient Hospital Stay (HOSPITAL_COMMUNITY): Payer: Medicare Other

## 2015-05-02 ENCOUNTER — Ambulatory Visit (HOSPITAL_COMMUNITY): Payer: Medicare Other

## 2015-05-02 LAB — CBC
HEMATOCRIT: 31.1 % — AB (ref 39.0–52.0)
Hemoglobin: 10.4 g/dL — ABNORMAL LOW (ref 13.0–17.0)
MCH: 33 pg (ref 26.0–34.0)
MCHC: 33.4 g/dL (ref 30.0–36.0)
MCV: 98.7 fL (ref 78.0–100.0)
Platelets: 189 10*3/uL (ref 150–400)
RBC: 3.15 MIL/uL — ABNORMAL LOW (ref 4.22–5.81)
RDW: 14.8 % (ref 11.5–15.5)
WBC: 6.3 10*3/uL (ref 4.0–10.5)

## 2015-05-02 LAB — BASIC METABOLIC PANEL
ANION GAP: 10 (ref 5–15)
BUN: 16 mg/dL (ref 6–20)
CALCIUM: 7.9 mg/dL — AB (ref 8.9–10.3)
CHLORIDE: 108 mmol/L (ref 101–111)
CO2: 20 mmol/L — AB (ref 22–32)
Creatinine, Ser: 0.84 mg/dL (ref 0.61–1.24)
GFR calc non Af Amer: >60 mL/min (ref >60)
GLUCOSE: 113 mg/dL — AB (ref 65–99)
POTASSIUM: 4.6 mmol/L (ref 3.5–5.1)
Sodium: 138 mmol/L (ref 135–145)

## 2015-05-02 MED ORDER — HYDROCODONE-ACETAMINOPHEN 5-325 MG PO TABS
1.0000 | ORAL_TABLET | Freq: Four times a day (QID) | ORAL | Status: DC | PRN
  Administered 2015-05-02 – 2015-05-03 (×3): 1 via ORAL
  Filled 2015-05-02 (×4): qty 1

## 2015-05-02 MED ORDER — OXYCODONE HCL 5 MG PO TABS
5.0000 mg | ORAL_TABLET | Freq: Four times a day (QID) | ORAL | Status: DC | PRN
  Administered 2015-05-02: 5 mg via ORAL
  Filled 2015-05-02: qty 1

## 2015-05-02 MED ORDER — ENSURE ENLIVE PO LIQD
237.0000 mL | Freq: Two times a day (BID) | ORAL | Status: DC
  Administered 2015-05-02 – 2015-05-05 (×6): 237 mL via ORAL

## 2015-05-02 NOTE — Progress Notes (Signed)
PROGRESS NOTE  Charles Bridges:[id number] DOB: 03/01/1936 DOA: 04/27/2015 PCP: Ailene Ravel, MD  HPI: 79 year old white male sent from nursing home with encephalopathy and left-sided weakness. Had a recent hospitalization after a fall. At that time, was found to have C-spine is soft tissue injury and fracture of anterior C3-4 osteophyte with stenosis noted and cord edema. On 9/20, had C3, 4, 5 and 6 laminectomies with posterior arthrodesis of C3-6, morselized allograft by Dr. Mikal Plane. In the emergency room, found to be in acute renal failure and have a urinary tract infection. CT brain was negative for anything acute.   Subjective / 24 H Interval events - alert this morning, endorses shoulder pain   Assessment/Plan: Principal Problem:   Acute encephalopathy Active Problems:   Carotid stenosis   Essential hypertension   HLD (hyperlipidemia)   History of CVA (cerebrovascular accident)   Alcohol abuse   AKI (acute kidney injury) (HCC)   Chronic indwelling Foley catheter   UTI (urinary tract infection)   Central cord syndrome at C3 level of cervical spinal cord (HCC)   Left-sided weakness   Anemia   Gram-positive cocci bacteremia   Pressure ulcer   Coagulase negative Staphylococcus bacteremia   Coag negative staph bacteremia on cultures on 10/2 - on vancomycin - surveillance cultures sent 10/5, NGTD - MRI as below, no apparent abscess - No drainage or cellulitis at surgical site - ID consulted - TTE  - afebrile - if continues to be afebrile, cultures negative for 72h, place PICC line and plan for 14 days of vancomycin per ID recs  Acute encephalopathy  - improving off of narcotics, due to pain I have restarted oxycodone however became again more confused. Will d/c Oxy and try Vicodin alone.  - also secondary to infection, dehydration. Currently on Tylenol only.  - MRI as below, no acute findings  UTI (urinary tract infection) - culture negative. discontinue  Rocephin  Central cord syndrome - S/p C3,4,5,and C6 laminectomies, Posterior arthrodesis C3-6, morselized allograft by Dr. Mikal Plane last hospitalization on 04/15/2015 - Patient has 1 out of 5 movement on his left upper extremity and about 1-2 out of 5 left lower extremity. Right sided strength is about 3-4 out of 5.  - repeat MRI on 10/5 with small fluid collection at surgical site, discussed with Dr. Franky Macho 10/6, and he reviewed images, it appears that this is expected post op seroma.  - off collar per Dr. Franky Macho as well  AKI (acute kidney injury) - secondary to prerenal azotemia. Resolving. Continue hydration.  Macrocytic anemia - B-12 and folate okay - No reported bleeding. Will heme occult stools. relatively low iron and TIBC with high ferritin suggestive of chronic disease. Monitor.  Carotid stenosis  Essential hypertension - hypertensive, lisinopril/HCTZ held due to renal failure, start Norvasc  HLD (hyperlipidemia)  History of CVA (cerebrovascular accident)  Alcohol abuse - Doubt withdrawal, as patient too far out from last drink, but would continue thiamine.  Chronic indwelling Foley catheter - change foley  Deconditioning - PT consult  Hypernatremia  - corrected with fluids   Diet: DIET DYS 3 Room service appropriate?: Yes; Fluid consistency:: Nectar Thick Fluids: NS DVT Prophylaxis: SCD  Code Status: Full Code Family Communication: d/w wife and sister bedside Disposition Plan: SNF when ready   Consultants:  None   Procedures:  None    Antibiotics Ceftriaxone 10/2 >> 10/5 Vancomycin 10/2 >>   Studies  Mr Brain Wo Contrast  04/30/2015   CLINICAL DATA:  Left  sided weakness developed recently. Recent fracture at C3-4 treated with surgery on 04/15/2015.  EXAM: MRI HEAD WITHOUT CONTRAST  MRI CERVICAL SPINE WITHOUT CONTRAST  TECHNIQUE: Multiplanar, multiecho pulse sequences of the brain and surrounding structures, and cervical spine, to include the  craniocervical junction and cervicothoracic junction, were obtained without intravenous contrast.  COMPARISON:  CT 04/27/2015.  MRI 04/15/2015.  MRI 04/14/2015.  FINDINGS: MRI HEAD FINDINGS  Diffusion imaging does not show any acute or subacute infarction. There are mild chronic small-vessel ischemic changes affecting the pons. No focal cerebellar insult. There is old infarction affecting both thalami I an the right basal ganglia. There chronic small-vessel ischemic changes affecting the hemispheric deep white matter. No large vessel territory infarction. No mass lesion, hemorrhage, hydrocephalus or extra-axial collection. No pituitary mass. Mild mucosal inflammatory changes affect the paranasal sinuses. Major vessels at the base of the brain show flow.  MRI CERVICAL SPINE FINDINGS  The patient has had interval posterior decompression and fusion from C3 through C6. Lateral mass hardware appears grossly well positioned as evaluated by MRI. There is fluid within the operative approach in the region behind C3 through C5. This could be ordinary postoperative seroma, but CSF leak is not excluded. This fluid collection does not appear to visibly compress the spinal cord. Edema remains evident in the C4-5 disc space consistent with a hyperextension injury. There is no evidence of epidural hematoma postoperatively.  IMPRESSION: MRI head: No acute infarction. Old small vessel Ing infarctions most notable in the right basal ganglia and both thalami.  MRI cervical spine: Interval posterior decompression and posterior fusion from C3 through C6. Fluid collection along the operative approach behind C3, C4 and C5 measuring approximately 5.6 x 5.2 by 3.0 cm. This could be postoperative seroma or could indicate CSF leak. No evidence of epidural hematoma.   Electronically Signed   By: Paulina Fusi M.D.   On: 04/30/2015 16:17   Mr Cervical Spine W Wo Contrast  04/30/2015   CLINICAL DATA:  Left sided weakness developed recently.  Recent fracture at C3-4 treated with surgery on 04/15/2015.  EXAM: MRI HEAD WITHOUT CONTRAST  MRI CERVICAL SPINE WITHOUT CONTRAST  TECHNIQUE: Multiplanar, multiecho pulse sequences of the brain and surrounding structures, and cervical spine, to include the craniocervical junction and cervicothoracic junction, were obtained without intravenous contrast.  COMPARISON:  CT 04/27/2015.  MRI 04/15/2015.  MRI 04/14/2015.  FINDINGS: MRI HEAD FINDINGS  Diffusion imaging does not show any acute or subacute infarction. There are mild chronic small-vessel ischemic changes affecting the pons. No focal cerebellar insult. There is old infarction affecting both thalami I an the right basal ganglia. There chronic small-vessel ischemic changes affecting the hemispheric deep white matter. No large vessel territory infarction. No mass lesion, hemorrhage, hydrocephalus or extra-axial collection. No pituitary mass. Mild mucosal inflammatory changes affect the paranasal sinuses. Major vessels at the base of the brain show flow.  MRI CERVICAL SPINE FINDINGS  The patient has had interval posterior decompression and fusion from C3 through C6. Lateral mass hardware appears grossly well positioned as evaluated by MRI. There is fluid within the operative approach in the region behind C3 through C5. This could be ordinary postoperative seroma, but CSF leak is not excluded. This fluid collection does not appear to visibly compress the spinal cord. Edema remains evident in the C4-5 disc space consistent with a hyperextension injury. There is no evidence of epidural hematoma postoperatively.  IMPRESSION: MRI head: No acute infarction. Old small vessel Ing infarctions most notable  in the right basal ganglia and both thalami.  MRI cervical spine: Interval posterior decompression and posterior fusion from C3 through C6. Fluid collection along the operative approach behind C3, C4 and C5 measuring approximately 5.6 x 5.2 by 3.0 cm. This could be  postoperative seroma or could indicate CSF leak. No evidence of epidural hematoma.   Electronically Signed   By: Paulina Fusi M.D.   On: 04/30/2015 16:17    Objective  Filed Vitals:   05/01/15 2024 05/02/15 0014 05/02/15 0424 05/02/15 0759  BP: 126/38 126/43 127/39 132/46  Pulse: 64 58 65 66  Temp: 98.5 F (36.9 C) 98.5 F (36.9 C) 98 F (36.7 C) 98 F (36.7 C)  TempSrc: Oral Oral Oral Oral  Resp: Height:      Weight:   98.793 kg (217 lb 12.8 oz)   SpO2: 92% 90% 92% 94%    Intake/Output Summary (Last 24 hours) at 05/02/15 0815 Last data filed at 05/02/15 0529  Gross per 24 hour  Intake   1196 ml  Output   1352 ml  Net   -156 ml   Filed Weights   04/27/15 2323 04/29/15 0426 05/02/15 0424  Weight: 94.303 kg (207 lb 14.4 oz) 95.391 kg (210 lb 4.8 oz) 98.793 kg (217 lb 12.8 oz)   Exam:  GENERAL: NAD  HEENT: head NCAT, no scleral icterus. Pupils round and reactive.  NECK: Supple. Collar in place, dressing CDI  LUNGS: Clear to auscultation. No wheezing or crackles  HEART: Regular rate and rhythm without murmur. 2+ pulses, no JVD, no peripheral edema  ABDOMEN: Soft, nontender, and nondistended. Positive bowel sounds.   EXTREMITIES: Without any cyanosis, clubbing, rashes  NEUROLOGIC: left sided weakness 1/5, stronger on right.  PSYCHIATRIC: Normal mood and affect  Data Reviewed: Basic Metabolic Panel:  Recent Labs Lab 04/27/15 1832 04/28/15 0432 04/29/15 0533 04/30/15 0715 05/01/15 0435 05/02/15 0314  NA 141 146* 139 142 145 138  K 4.3 4.3 3.5 4.1 4.3 4.6  CL 107 113* 111 111 114* 108  CO2 20* 21* 20*  GLUCOSE 142* 150* 131* 117* 105* 113*  BUN 97* 82* 43* 25* 20 16  CREATININE 2.22* 1.66* 1.05 0.83 0.91 0.84  CALCIUM 8.5* 8.6* 7.9* 8.5* 8.3* 7.9*  MG 3.2*  --   --   --   --   --    Liver Function Tests:  Recent Labs Lab 04/27/15 1832 04/28/15 0432  AST 41 51*  ALT 50 53  ALKPHOS 101 101  BILITOT 1.1 1.2  PROT 6.1* 6.1*    ALBUMIN 2.6* 2.4*    Recent Labs Lab 04/28/15 0432  AMMONIA 18   CBC:  Recent Labs Lab 04/27/15 1832 04/28/15 0432 04/29/15 0533 04/30/15 0715 05/01/15 0435 05/02/15 0314  WBC 9.4 7.8 6.3 6.3 5.5 6.3  NEUTROABS 7.8* 7.0  --   --   --   --   HGB 8.5* 8.0* 8.2* 9.0* 8.2* 10.4*  HCT 26.4* 25.5* 25.2* 27.3* 25.0* 31.1*  MCV 101.9* 102.4* 101.6* 99.6 101.2* 98.7  PLT 332 316 304 322 291 189   CBG:  Recent Labs Lab 04/27/15 1833  GLUCAP 120*    Recent Results (from the past 240 hour(s))  Urine culture     Status: None   Collection Time: 04/27/15  7:59 PM  Result Value Ref Range Status   Specimen Description URINE, CATHETERIZED  Final   Special Requests NONE  Final   Culture  NO GROWTH 1 DAY  Final   Report Status 04/28/2015 FINAL  Final  Culture, blood (x 2)     Status: None   Collection Time: 04/27/15 10:00 PM  Result Value Ref Range Status   Specimen Description BLOOD LEFT ARM  Final   Special Requests BOTTLES DRAWN AEROBIC AND ANAEROBIC 2CC  Final   Culture  Setup Time   Final    GRAM POSITIVE COCCI IN CLUSTERS IN BOTH AEROBIC AND ANAEROBIC BOTTLES CRITICAL RESULT CALLED TO, READ BACK BY AND VERIFIED WITH: Silvestre Moment RN 2133 04/28/15 A BROWNING    Culture STAPHYLOCOCCUS SPECIES (COAGULASE NEGATIVE)  Final   Report Status 05/01/2015 FINAL  Final   Organism ID, Bacteria STAPHYLOCOCCUS SPECIES (COAGULASE NEGATIVE)  Final      Susceptibility   Staphylococcus species (coagulase negative) - MIC*    CIPROFLOXACIN >=8 RESISTANT Resistant     ERYTHROMYCIN >=8 RESISTANT Resistant     GENTAMICIN 8 INTERMEDIATE Intermediate     OXACILLIN >=4 RESISTANT Resistant     TETRACYCLINE 2 SENSITIVE Sensitive     VANCOMYCIN 2 SENSITIVE Sensitive     TRIMETH/SULFA 80 RESISTANT Resistant     CLINDAMYCIN >=8 RESISTANT Resistant     RIFAMPIN <=0.5 SENSITIVE Sensitive     Inducible Clindamycin NEGATIVE Sensitive     * STAPHYLOCOCCUS SPECIES (COAGULASE NEGATIVE)  Culture, blood (x  2)     Status: None   Collection Time: 04/27/15 10:15 PM  Result Value Ref Range Status   Specimen Description BLOOD RIGHT ARM  Final   Special Requests BOTTLES DRAWN AEROBIC AND ANAEROBIC 5CC  Final   Culture  Setup Time   Final    GRAM POSITIVE COCCI IN CLUSTERS BOTTLES DRAWN AEROBIC ONLY CRITICAL RESULT CALLED TO, READ BACK BY AND VERIFIED WITH: M CRAVEN  04/29/15 MKELLY    Culture   Final    STAPHYLOCOCCUS SPECIES (COAGULASE NEGATIVE) SUSCEPTIBILITIES PERFORMED ON PREVIOUS CULTURE WITHIN THE LAST 5 DAYS.    Report Status 05/01/2015 FINAL  Final  Culture, blood (routine x 2)     Status: None (Preliminary result)   Collection Time: 04/30/15  6:55 PM  Result Value Ref Range Status   Specimen Description BLOOD LEFT HAND  Final   Special Requests BOTTLES DRAWN AEROBIC AND ANAEROBIC 10CC  Final   Culture NO GROWTH < 24 HOURS  Final   Report Status PENDING  Incomplete  Culture, blood (routine x 2)     Status: None (Preliminary result)   Collection Time: 04/30/15  7:03 PM  Result Value Ref Range Status   Specimen Description BLOOD LEFT ANTECUBITAL  Final   Special Requests BOTTLES DRAWN AEROBIC AND ANAEROBIC 10CC  Final   Culture NO GROWTH < 24 HOURS  Final   Report Status PENDING  Incomplete     Scheduled Meds: . acetaminophen  650 mg Oral TID  . amLODipine  5 mg Oral Daily  . antiseptic oral rinse  7 mL Mouth Rinse BID  . aspirin  81 mg Oral Daily  . folic acid  1 mg Oral Daily  . latanoprost  1 drop Right Eye QHS  . metoprolol  25 mg Oral BID  . pantoprazole  40 mg Oral Daily  . pravastatin  40 mg Oral q1800  . senna  1 tablet Oral BID  . sodium chloride  3 mL Intravenous Q12H  . thiamine  100 mg Oral Daily  . vancomycin  1,000 mg Intravenous Q12H    Continuous Infusions: . 0.9 %  NaCl with KCl 20 mEq / L 75 mL/hr at 05/01/15 1830  . lactated ringers 50 mL/hr (04/30/15 1115)    Pamella Pert, MD [facility] Pager [phone]. If 7 PM - 7 AM, please  contact night-coverage at [email or web address], password [facility] 05/02/2015, 8:15 AM  LOS: 5 days

## 2015-05-02 NOTE — Progress Notes (Addendum)
Initial Nutrition Assessment  DOCUMENTATION CODES:   Not applicable  INTERVENTION:    Ensure Enlive PO BID, each supplement provides 350 kcal and 20 grams of protein  NUTRITION DIAGNOSIS:   Increased nutrient needs related to wound healing as evidenced by estimated needs.  GOAL:   Patient will meet greater than or equal to 90% of their needs  MONITOR:   PO intake, Supplement acceptance, Skin, Weight trends  REASON FOR ASSESSMENT:   Low Braden    ASSESSMENT:   78 y.o. male recently hospitalized 9/20-9/27 for fall with cervical spine fracture requiring fixation with complication of spinal cord syndrome and severe extremity weakness.He was readmitted 10/2 after becoming increasingly confused at the SNF for rehab. During that stay he also appears to have developed bilateral heel ulcers.   Patient with pressure ulcers to bilateral heels, needs adequate protein to promote wound healing. Intake of meals is poor, consuming ~25% of meals. Would benefit from an oral supplement to maximize intake of protein and calories.   Unable to complete Nutrition-Focused physical exam at this time.   Diet Order:  DIET DYS 3 Room service appropriate?: Yes; Fluid consistency:: Nectar Thick  Skin:  Wound (see comment) (DTI to left and right heels)  Last BM:  10/5  Height:   Ht Readings from Last 1 Encounters:  04/27/15 6' 1.5" (1.867 m)    Weight:   Wt Readings from Last 1 Encounters:  05/02/15 217 lb 12.8 oz (98.793 kg)    Ideal Body Weight:  85 kg  BMI:  Body mass index is 28.34 kg/(m^2).  Estimated Nutritional Needs:   Kcal:  2200-2400  Protein:  120-140 gm  Fluid:  2.2-2.5 L  EDUCATION NEEDS:   No education needs identified at this time  Joaquin Courts, RD, LDN, CNSC Pager [phone] After Hours Pager [phone]

## 2015-05-02 NOTE — Care Management Note (Signed)
Case Management Note  Patient Details  Name: CLEMON DEVAUL MRN: [id number] Date of Birth: 03-05-1936  Subjective/Objective:  Pt admitted for Acute encephalopathy. Recent fall and post-C3, 4, 5 and 6 laminectomies with posterior arthrodesis of C3-6, morselized allograft by Dr. Mikal Plane. Upon admission pt found to be in acute renal failure and have a urinary tract infection. CT brain was negative.                Action/Plan: Plan for pt to go to [facility] once stable. CSW to assist with disposition needs. No further needs from CM at this time.   Expected Discharge Date:                  Expected Discharge Plan:  Skilled Nursing Facility  In-House Referral:  Clinical Social Work  Discharge planning Services  CM Consult  Post Acute Care Choice:    Choice offered to:     DME Arranged:    DME Agency:     HH Arranged:    HH Agency:     Status of Service:  In process, will continue to follow  Medicare Important Message Given:  Yes-second notification given Date Medicare IM Given:    Medicare IM give by:    Date Additional Medicare IM Given:    Additional Medicare Important Message give by:     If discussed at Long Length of Stay Meetings, dates discussed:    Additional Comments:  Gala Lewandowsky, RN 05/02/2015, 12:36 PM

## 2015-05-02 NOTE — Clinical Social Work Note (Signed)
CSW received [facility] authorization for patient to return to [facility] this weekend if discharged. Auth #: [id number], valid until 10/9 at 3:54PM. Rug level RVC. Authorization information provided to [facility] at [employer]. Facility states they are able to facility a weekend admission if patient is able to DC.   Roddie Mc MSW, [place], [place], [phone]

## 2015-05-02 NOTE — Clinical Social Work Note (Signed)
Patient's clinicals sent to [facility] for insurance authorization. CSW has requested that they prioritize the patient's auth for [facility]. Rep Vernona Rieger stated that she would contact this CSW with auth information and if this CSW is unavailable she will leave authorization information on the weekend CSW numbers (numbers provided [phone] and [phone])   Roddie Mc MSW, [place], [place], [phone]

## 2015-05-02 NOTE — Progress Notes (Signed)
MBSS complete. Full report located under chart review in imaging section. Karema Tocci, MA CCC-SLP [phone]  

## 2015-05-02 NOTE — Progress Notes (Signed)
[facility]    Date of Admission:  04/27/2015      ID: Charles Bridges is a 79 y.o. male with coag negative staph bacteremia that was transferred from SNF where he was in rehab for his cervical spine fracture s/p C3-C6 arthrodesis and suffering central cord syndrome. His mental status was altered and disoriented but is now greatly improved, near baseline.   Principal Problem:   Acute encephalopathy Active Problems:   Carotid stenosis   Essential hypertension   HLD (hyperlipidemia)   History of CVA (cerebrovascular accident)   Alcohol abuse   AKI (acute kidney injury) (HCC)   Chronic indwelling Foley catheter   UTI (urinary tract infection)   Central cord syndrome at C3 level of cervical spinal cord (HCC)   Left-sided weakness   Anemia   Gram-positive cocci bacteremia   Pressure ulcer   Coagulase negative Staphylococcus bacteremia   Subjective: No new events overnight. Patient remains afebrile and vitals wnl. Had some episodic confusion and disorientation, but was better on repeat visit. He remains entirely bed bound without further improvement in strength.   Medications:  . amLODipine  5 mg Oral Daily  . antiseptic oral rinse  7 mL Mouth Rinse BID  . aspirin  81 mg Oral Daily  . feeding supplement (ENSURE ENLIVE)  237 mL Oral BID BM  . folic acid  1 mg Oral Daily  . latanoprost  1 drop Right Eye QHS  . metoprolol  25 mg Oral BID  . pantoprazole  40 mg Oral Daily  . pravastatin  40 mg Oral q1800  . senna  1 tablet Oral BID  . sodium chloride  3 mL Intravenous Q12H  . thiamine  100 mg Oral Daily  . vancomycin  1,000 mg Intravenous Q12H    Objective: Vital signs in last 24 hours: Temp:  [98 F (36.7 C)-99 F (37.2 C)] 99 F (37.2 C) (10/07 1140) Pulse Rate:  [58-66] 59 (10/07 1140) Resp:  [16-20] 20 (10/07 1140) BP: (126-151)/(38-50) 151/50 mmHg (10/07 1140) SpO2:  [90 %-96 %] 96 % (10/07 1140) Weight:  [98.793 kg (217 lb 12.8 oz)]  98.793 kg (217 lb 12.8 oz) (10/07 0424)   GENERAL- co-operative, NAD HEENT- Atraumatic, PERRL, oral mucosa appears moist, dentures in place, hard of hearing, clean, closed incision at back of neck without drainage or surrounding erythema CARDIAC- RRR, no murmurs, rubs or gallops. RESP- CTAB, no wheezes or crackles. NEURO- No obvious Cr N abnormality, strength in L upper and b/l lower extremities is 2/5. Slightly better in flexion than extension. RUE strength is appropriate. EXTREMITIES- No pedal edema, bilateral partial thickness heel ulcers SKIN- Warm, dry, No rash or lesion.  Lab Results  Recent Labs  05/01/15 0435 05/02/15 0314  WBC 5.5 6.3  HGB 8.2* 10.4*  HCT 25.0* 31.1*  NA 145 138  K 4.3 4.6  CL 114* 108  CO2 21* 20*  BUN 20 16  CREATININE 0.91 0.84   Liver Panel No results for input(s): PROT, ALBUMIN, AST, ALT, ALKPHOS, BILITOT, BILIDIR, IBILI in the last 72 hours. Sedimentation Rate No results for input(s): ESRSEDRATE in the last 72 hours. C-Reactive Protein No results for input(s): CRP in the last 72 hours.  Microbiology:  Studies/Results: Dg Swallowing Func-speech Pathology  05/02/2015    Objective Swallowing Evaluation:    Patient Details  Name: Charles Bridges MRN: [id number] Date of Birth: 1936-01-11  Today's Date: 05/02/2015 Time: SLP Start Time (ACUTE ONLY): 1040-SLP  Stop Time (ACUTE ONLY): 1100 SLP Time Calculation (min) (ACUTE ONLY): 20 min  Past Medical History:  Past Medical History  Diagnosis Date  . Carotid artery occlusion   . Hypertension   . Hyperlipidemia   . Arthritis   . Stroke [facility]) 1998 and  1999  . Ulcer     Hiatal Hernia / Gastric ulcers  . History of vagotomy   . Chronic diarrhea   . Fall against object Oct. 16, 2015    Pt got tangled up with dog chain out side in the woods.  . Diabetes mellitus 2006    TYPE 2   Past Surgical History:  Past Surgical History  Procedure Laterality Date  . Carotid endarterectomy  03/02/2000    Right CEA  . Fracture  surgery      Right Shoulder  . Fracture right leg    . Radiology with anesthesia N/A 04/15/2015    Procedure: MRI CERVICAL SPINE WITHOUT CONTRAST    (RADIOLOGY WITH  ANESTHESIA);  Surgeon: Medication Radiologist, MD;  Location: [facility] OR;   Service: Radiology;  Laterality: N/A;  . Posterior cervical fusion/foraminotomy N/A 04/15/2015    Procedure: POSTERIOR CERVICAL FUSION/FORAMINOTOMY LEVEL 3;  Surgeon:  Coletta Memos, MD;  Location: [facility] OR;  Service: Neurosurgery;  Laterality:  N/A;  C3-6 posterior cervical arthrodesis with instrumentation  . Radiology with anesthesia Left 04/30/2015    Procedure: MRI BRAIN WITHOUT CONTRAST, MRI CERVICAL SPINE WITH AND  WITHOUT CONTRAST;  Surgeon: Medication Radiologist, MD;  Location: [facility] OR;   Service: Radiology;  Laterality: Left;  DR. Derwood Kaplan   HPI:  Other Pertinent Information: 79 year old white male sent from nursing home  with encephalopathy and left-sided weakness. Had a recent hospitalization  after a fall. At that time, was found to have C-spine is soft tissue  injury and fracture of anterior C3-4 osteophyte with stenosis noted and  cord edema. On 9/20, had C3, 4, 5 and 6 laminectomies with posterior  arthrodesis of C3-6, morselized allograft by Dr. Mikal Plane. In the emergency  room, found to be in acute renal failure and have a urinary tract  infection. CT brain was negative for anything acute. PMH of CVA, alcohol  abuse. FEES complete 04/22/15 and recommended dysphagia 3 with nectar thick  liquids.   No Data Recorded  Assessment / Plan / Recommendation CHL IP CLINICAL IMPRESSIONS 05/02/2015  Therapy Diagnosis Moderate pharyngeal phase dysphagia  Clinical Impression Pt demonstrates a moderate oropharyngeal dysphagia  with decreased strength and mobility of the hyolaryngeal complex resulting  slightly decreased laryngeal closure and intermittent trace penetration of  thin liquids and mild to moderate residuals. An intermittent throat clear  and second swallow clears vesitibule  of minimal penetrate. No aspiration  observed. Recommend pt upgrade to regular texture diet and thin liqudis  with training for a second swallow and intermittent throat clear.       CHL IP TREATMENT RECOMMENDATION 05/02/2015  Treatment Recommendations Therapy as outlined in treatment plan below     CHL IP DIET RECOMMENDATION 05/02/2015  SLP Diet Recommendations Age appropriate regular solids;Thin  Liquid Administration via (None)  Medication Administration Whole meds with puree  Compensations Minimize environmental distractions;Slow rate;Small  sips/bites  Postural Changes and/or Swallow Maneuvers (None)     CHL IP OTHER RECOMMENDATIONS 05/02/2015  Recommended Consults (None)  Oral Care Recommendations Oral care BID  Other Recommendations (None)     CHL IP FOLLOW UP RECOMMENDATIONS 04/22/2015  Follow up Recommendations 24 hour supervision/assistance;Skilled Nursing  facility  CHL IP FREQUENCY AND DURATION 05/02/2015  Speech Therapy Frequency (ACUTE ONLY) min 2x/week  Treatment Duration 2 weeks     Pertinent Vitals/Pain NA    SLP Swallow Goals No flowsheet data found.  No flowsheet data found.    CHL IP REASON FOR REFERRAL 05/02/2015  Reason for Referral Objectively evaluate swallowing function     CHL IP ORAL PHASE 05/02/2015  Lips (None)  Tongue (None)  Mucous membranes (None)  Nutritional status (None)  Other (None)  Oxygen therapy (None)  Oral Phase Impaired  Oral - Pudding Teaspoon (None)  Oral - Pudding Cup (None)  Oral - Honey Teaspoon (None)  Oral - Honey Cup (None)  Oral - Honey Syringe (None)  Oral - Nectar Teaspoon (None)  Oral - Nectar Cup (None)  Oral - Nectar Straw (None)  Oral - Nectar Syringe (None)  Oral - Ice Chips (None)  Oral - Thin Teaspoon (None)  Oral - Thin Cup (None)  Oral - Thin Straw (None)  Oral - Thin Syringe (None)  Oral - Puree (None)  Oral - Mechanical Soft (None)  Oral - Regular (None)  Oral - Multi-consistency (None)  Oral - Pill (None)  Oral Phase - Comment (None)      CHL IP PHARYNGEAL  PHASE 05/02/2015  Pharyngeal Phase Impaired  Pharyngeal - Pudding Teaspoon (None)  Penetration/Aspiration details (pudding teaspoon) (None)  Pharyngeal - Pudding Cup (None)  Penetration/Aspiration details (pudding cup) (None)  Pharyngeal - Honey Teaspoon (None)  Penetration/Aspiration details (honey teaspoon) (None)  Pharyngeal - Honey Cup (None)  Penetration/Aspiration details (honey cup) (None)  Pharyngeal - Honey Syringe (None)  Penetration/Aspiration details (honey syringe) (None)  Pharyngeal - Nectar Teaspoon (None)  Penetration/Aspiration details (nectar teaspoon) (None)  Pharyngeal - Nectar Cup (None)  Penetration/Aspiration details (nectar cup) (None)  Pharyngeal - Nectar Straw (None)  Penetration/Aspiration details (nectar straw) (None)  Pharyngeal - Nectar Syringe (None)  Penetration/Aspiration details (nectar syringe) (None)  Pharyngeal - Ice Chips (None)  Penetration/Aspiration details (ice chips) (None)  Pharyngeal - Thin Teaspoon (None)  Penetration/Aspiration details (thin teaspoon) (None)  Pharyngeal - Thin Cup (None)  Penetration/Aspiration details (thin cup) (None)  Pharyngeal - Thin Straw (None)  Penetration/Aspiration details (thin straw) (None)  Pharyngeal - Thin Syringe (None)  Penetration/Aspiration details (thin syringe') (None)  Pharyngeal - Puree (None)  Penetration/Aspiration details (puree) (None)  Pharyngeal - Mechanical Soft (None)  Penetration/Aspiration details (mechanical soft) (None)  Pharyngeal - Regular (None)  Penetration/Aspiration details (regular) (None)  Pharyngeal - Multi-consistency (None)  Penetration/Aspiration details (multi-consistency) (None)  Pharyngeal - Pill (None)  Penetration/Aspiration details (pill) (None)  Pharyngeal Comment (None)      CHL IP CERVICAL ESOPHAGEAL PHASE 04/22/2015  Cervical Esophageal Phase Impaired  Pudding Teaspoon (None)  Pudding Cup (None)  Honey Teaspoon (None)  Honey Cup (None)  Honey Straw (None)  Nectar Teaspoon (None)  Nectar Cup (None)   Nectar Straw (None)  Nectar Sippy Cup (None)  Thin Teaspoon (None)  Thin Cup (None)  Thin Straw Esophageal backflow into the pharynx;Esophageal backflow into  the larynx  Thin Sippy Cup (None)  Cervical Esophageal Comment from the interarytnoid space which was then  aspirated, sensation intact and cough cleared aspirates with multple  swallows clearing residual     No flowsheet data found.        Harlon Ditty, [place] CCC-SLP [phone]  Claudine Mouton 05/02/2015, 3:35 PM      Assessment/Plan: Tota abtx:  Ceftriaxone 10/3-10/6   Vancomycin 10/2->>  Patient in pretty good condition  today, no altered mental status. HE has a lot of frustration about diminished functional status. No fevers, stable vital signs. No new results on cultures, seeming to point towards clearance and may not have any source issue to address. Echo still important for staph bacteremia. Continue to suspect heel ulcers as one possible source, good wound care will be essential after discharge.  -Continue vancomycin, out to 10/19 most likely if repeat cultures remain clear -Place PICC tomorrow  for SNF abtx access if repeat cultures remain negative to 72hrs -F/U repeat blood Cx from 10/5 x2 -F/U TTE results  Pincus Badder Internal Medicine Resident PGY-I Pager: [phone]  05/02/2015, 6:57 PM

## 2015-05-02 NOTE — Evaluation (Signed)
Physical Therapy Evaluation Patient Details Name: Charles Bridges MRN: [id number] DOB: 12/03/1935 Today's Date: 05/02/2015   History of Present Illness  Pt admitted for Acute encephalopathy. Recent fall and post-C3, 4, 5 and 6 laminectomies with posterior arthrodesis of C3-6, morselized allograft by Dr. Mikal Plane. Upon admission pt found to be in acute renal failure and have a urinary tract infection and bilateral heel ulcers. CT brain was negative.   Clinical Impression  Patient presents with decreased mobility due to deficits listed in PT problem list.  He will benefit from skilled PT in the acute setting to allow return to SNF with decreased burden of care.  Remains confused, but participative despite bowel incontinence able to sit EOB 10 minutes min to mod assist.    Follow Up Recommendations SNF    Equipment Recommendations  Other (comment) (TBA, likely power chair)    Recommendations for Other Services       Precautions / Restrictions Precautions Precautions: Fall;Cervical Precaution Comments: brace in room but not on patient, no orders written, last admit was AAT Required Braces or Orthoses: Cervical Brace Cervical Brace: Hard collar      Mobility  Bed Mobility Overal bed mobility: +2 for physical assistance Bed Mobility: Rolling;Sidelying to Sit;Sit to Supine Rolling: +2 for physical assistance;Total assist Sidelying to sit: Total assist;+2 for physical assistance   Sit to supine: +2 for physical assistance;Total assist   General bed mobility comments: rolled for hygiene due to soiled from bowel incontinence; assisted trunk upright and legs off bed, scooted to edge of bed, sat edge of bed 10 minutes working on trunk mobility, balance reactions and attempts at self support  Transfers                 General transfer comment: NT due to incontinence and needs lift equipement for safe standing trial or OOB  Ambulation/Gait                Stairs            Wheelchair Mobility    Modified Rankin (Stroke Patients Only)       Balance Overall balance assessment: Needs assistance   Sitting balance-Leahy Scale: Poor Sitting balance - Comments: sat EOB 10 minutes, working on correcting LOB with cues, support min to mod assist and increased time                                     Pertinent Vitals/Pain Pain Score: 7  Pain Location: shoulders in sitting Pain Descriptors / Indicators: Aching Pain Intervention(s): Limited activity within patient's tolerance;Monitored during session;Repositioned    Home Living Family/patient expects to be discharged to:: Skilled nursing facility Living Arrangements: Spouse/significant other Available Help at Discharge: Family                  Prior Function           Comments: family reports stood once in therapy at SNF     Hand Dominance   Dominant Hand: Right    Extremity/Trunk Assessment   Upper Extremity Assessment: Generalized weakness RUE Deficits / Details: edema in arms, AAROM painful in shoulder, strength grossly 2/5, elbow 2+/5, wrist and hand 1+/5   RUE Sensation: decreased light touch LUE Deficits / Details: edema in arms, strength shoulder flexion 1+/5, elbow 2/5, wrist/hand 1/5     RLE Deficits / Details: AAROM WFL, with increased tone noted throughout;  strength grossly 2-/5 LLE Deficits / Details: AAROM WFL, increased tone noted, strength grossly 3-/5     Communication   Communication: HOH  Cognition Arousal/Alertness: Awake/alert Behavior During Therapy: WFL for tasks assessed/performed Overall Cognitive Status: Impaired/Different from baseline Area of Impairment: Orientation;Memory;Following commands Orientation Level: Disoriented to;Place;Time;Situation     Following Commands: Follows one step commands consistently       General Comments: keeps wanting someone to bring him pants though he is in process of being cleaned due to incontinent  of bowel    General Comments      Exercises General Exercises - Upper Extremity Shoulder Flexion: PROM;Both;10 reps;Supine Elbow Flexion: AAROM;Both;10 reps;Supine Wrist Flexion: PROM;Both;10 reps;Supine General Exercises - Lower Extremity Ankle Circles/Pumps: PROM;AAROM;Both;Supine;5 reps Heel Slides: PROM;AAROM;Both;5 reps;Supine Hip ABduction/ADduction: PROM;5 reps;Supine;Both      Assessment/Plan    PT Assessment Patient needs continued PT services  PT Diagnosis Generalized weakness;Quadraplegia   PT Problem List Decreased strength;Decreased mobility;Decreased balance;Impaired sensation;Decreased safety awareness;Decreased activity tolerance;Decreased knowledge of use of DME  PT Treatment Interventions DME instruction;Balance training;Gait training;Functional mobility training;Therapeutic activities;Therapeutic exercise;Wheelchair mobility training;Patient/family education   PT Goals (Current goals can be found in the Care Plan section) Acute Rehab PT Goals Patient Stated Goal: To get OOB PT Goal Formulation: With patient Time For Goal Achievement: 05/16/15 Potential to Achieve Goals: Good    Frequency Min 3X/week   Barriers to discharge        Co-evaluation               End of Session   Activity Tolerance: Patient tolerated treatment well Patient left: in bed;with call bell/phone within reach           Time: 1219-1250 PT Time Calculation (min) (ACUTE ONLY): 31 min   Charges:   PT Evaluation $Initial PT Evaluation Tier I: 1 Procedure PT Treatments $Therapeutic Activity: 8-22 mins   PT G Codes:        Kilani Joffe,CYNDI 05-27-15, 1:55 PM  [place], PT [phone] 05-27-15

## 2015-05-03 ENCOUNTER — Inpatient Hospital Stay (HOSPITAL_COMMUNITY): Payer: Medicare Other

## 2015-05-03 DIAGNOSIS — N390 Urinary tract infection, site not specified: Secondary | ICD-10-CM | POA: Insufficient documentation

## 2015-05-03 DIAGNOSIS — R509 Fever, unspecified: Secondary | ICD-10-CM

## 2015-05-03 MED ORDER — HYDROCODONE-ACETAMINOPHEN 5-325 MG PO TABS
1.0000 | ORAL_TABLET | Freq: Four times a day (QID) | ORAL | Status: DC | PRN
  Administered 2015-05-03: 1 via ORAL
  Administered 2015-05-03 – 2015-05-05 (×5): 2 via ORAL
  Filled 2015-05-03 (×3): qty 2
  Filled 2015-05-03: qty 1
  Filled 2015-05-03 (×2): qty 2

## 2015-05-03 NOTE — Progress Notes (Signed)
ANTIBIOTIC CONSULT NOTE - FOLLOW UP  Pharmacy Consult for Vancomycin Indication: bacteremia  No Known Allergies  Patient Measurements: Height: 6' 1.5" (186.7 cm) Weight: 217 lb (98.431 kg) IBW/kg (Calculated) : 81.05  Vital Signs: Temp: 98.7 F (37.1 C) (10/08 1300) Temp Source: Oral (10/08 1300) BP: 132/48 mmHg (10/08 1300) Pulse Rate: 71 (10/08 1300) Intake/Output from previous day: 10/07 0701 - 10/08 0700 In: 6962.5 [I.V.:6762.5; IV Piggyback:200] Out: 1177 [Urine:1175; Stool:2] Intake/Output from this shift:    Labs:  Recent Labs  05/01/15 0435 05/02/15 0314  WBC 5.5 6.3  HGB 8.2* 10.4*  PLT 291 189  CREATININE 0.91 0.84   Estimated Creatinine Clearance: 91.7 mL/min (by C-G formula based on Cr of 0.84).  Recent Labs  04/30/15 2139  VANCOTROUGH 14      Assessment: 79 yo M recently discharged from [facility] after a C4-5 fusion surgery (04/13/15 > 04/22/15). Since that time pt has had increased weakness, AMS.   No drainage or cellulitis of neck. MRI on 10/5 shows fluid around c3-c6 which was his operative site.  Coag negative staph bacteremia on cultures on 10/2, no apparent abscess on MRI.  WBC wnl, Afebrile, SCR improved now 0.84, CrCl 91, trough checked 10/5 (SCr was 0.83 down from 2.22 on admit) and vanc dose was increased (see below)  10/5 VT = 14 on  750 mg q12h   (incr'd to 1g q12H - repeat VT was not drawn by lab on 10/8 AM, nurse hung dose; will reschedule trough for tonight) Rocephin  (?UTI) 10/2>> 10/6 Vanc 10/2>>10/3; 10/3>>  Ampicillin 10/2>>10/3   10/2 Bld x2 GPC clusters 2/2 , 2 of 2 Staph coag neg MRSE  10/2 Urine neg /final 10/5 BCx x2 ngtd  Goal of Therapy:  Vancomycin trough level 15-20 mcg/ml  Plan:  Continue Vancomycin 1g IV q12h Vanc trough tonight prior to dose at midnight F/U LOT, cultures, troughs PRN  Arcola Jansky, PharmD Clinical Pharmacy Resident Pager: [phone] 05/03/2015,2:38 PM

## 2015-05-03 NOTE — Progress Notes (Signed)
  Echocardiogram 2D Echocardiogram has been performed.  Charles Bridges 05/03/2015, 5:48 PM

## 2015-05-03 NOTE — Progress Notes (Signed)
PROGRESS NOTE  Charles Bridges ZOX:[id number] DOB: Jun 15, 1936 DOA: 04/27/2015 PCP: Ailene Ravel, MD  HPI: 79 year old white male sent from nursing home with encephalopathy and left-sided weakness. Had a recent hospitalization after a fall. At that time, was found to have C-spine is soft tissue injury and fracture of anterior C3-4 osteophyte with stenosis noted and cord edema. On 9/20, had C3, 4, 5 and 6 laminectomies with posterior arthrodesis of C3-6, morselized allograft by Dr. Mikal Plane. In the emergency room, found to be in acute renal failure and have a urinary tract infection. CT brain was negative for anything acute.   Subjective / 24 H Interval events - complaining of hip pain this morning - confused, thinks a "pro football player was just in his room"  Assessment/Plan: Principal Problem:   Acute encephalopathy Active Problems:   Carotid stenosis   Essential hypertension   HLD (hyperlipidemia)   History of CVA (cerebrovascular accident)   Alcohol abuse   AKI (acute kidney injury) (HCC)   Chronic indwelling Foley catheter   UTI (urinary tract infection)   Central cord syndrome at C3 level of cervical spinal cord (HCC)   Left-sided weakness   Anemia   Gram-positive cocci bacteremia   Pressure ulcer   Coagulase negative Staphylococcus bacteremia   Coag negative staph bacteremia on cultures on 10/2 - on vancomycin - surveillance cultures sent 10/5, NGTD - MRI as below, no apparent abscess - No drainage or cellulitis at surgical site - ID consulted - TTE pending, ordered 2 days ago. Cannot place PICC Line until 2D echo is resulted.  - afebrile, PICC line placement upon negative TTE  Acute encephalopathy  - improving off of narcotics, due to pain I have restarted oxycodone however became again more confused.  - confused on Vicodin still. If persists, will d/c narcotics altogether  - also secondary to infection, dehydration. Currently on Tylenol only.  - MRI as below,  no acute findings  UTI (urinary tract infection) - culture negative. discontinue Rocephin  Central cord syndrome - S/p C3,4,5,and C6 laminectomies, Posterior arthrodesis C3-6, morselized allograft by Dr. Mikal Plane last hospitalization on 04/15/2015 - Patient has 1 out of 5 movement on his left upper extremity and about 1-2 out of 5 left lower extremity. Right sided strength is about 3-4 out of 5.  - repeat MRI on 10/5 with small fluid collection at surgical site, discussed with Dr. Franky Macho 10/6, and he reviewed images, it appears that this is expected post op seroma.  - off collar per Dr. Franky Macho   AKI (acute kidney injury) - secondary to prerenal azotemia. Resolving. Continue hydration.  Macrocytic anemia - B-12 and folate okay - No reported bleeding. Will heme occult stools. relatively low iron and TIBC with high ferritin suggestive of chronic disease. Monitor.  Carotid stenosis  Essential hypertension - hypertensive, lisinopril/HCTZ held due to renal failure, start Norvasc  HLD (hyperlipidemia)  History of CVA (cerebrovascular accident)  Alcohol abuse - Doubt withdrawal, as patient too far out from last drink, but would continue thiamine.  Chronic indwelling Foley catheter - changed foley  Deconditioning - PT consult  Hypernatremia  - corrected with fluids   Diet: Diet regular Room service appropriate?: Yes; Fluid consistency:: Thin Fluids: NS DVT Prophylaxis: SCD  Code Status: Full Code Family Communication: d/w wife and sister bedside Disposition Plan: SNF when ready   Consultants:  None   Procedures:  None    Antibiotics Ceftriaxone 10/2 >> 10/5 Vancomycin 10/2 >>   Studies  Dg Swallowing  Func-speech Pathology  05/02/2015    Objective Swallowing Evaluation:    Patient Details  Name: Charles Bridges MRN: [id number] Date of Birth: 09-01-1935  Today's Date: 05/02/2015 Time: SLP Start Time (ACUTE ONLY): 1040-SLP Stop Time (ACUTE ONLY): 1100 SLP Time  Calculation (min) (ACUTE ONLY): 20 min  Past Medical History:  Past Medical History  Diagnosis Date  . Carotid artery occlusion   . Hypertension   . Hyperlipidemia   . Arthritis   . Stroke [facility]) 1998 and  1999  . Ulcer     Hiatal Hernia / Gastric ulcers  . History of vagotomy   . Chronic diarrhea   . Fall against object Oct. 16, 2015    Pt got tangled up with dog chain out side in the woods.  . Diabetes mellitus 2006    TYPE 2   Past Surgical History:  Past Surgical History  Procedure Laterality Date  . Carotid endarterectomy  03/02/2000    Right CEA  . Fracture surgery      Right Shoulder  . Fracture right leg    . Radiology with anesthesia N/A 04/15/2015    Procedure: MRI CERVICAL SPINE WITHOUT CONTRAST    (RADIOLOGY WITH  ANESTHESIA);  Surgeon: Medication Radiologist, MD;  Location: [facility] OR;   Service: Radiology;  Laterality: N/A;  . Posterior cervical fusion/foraminotomy N/A 04/15/2015    Procedure: POSTERIOR CERVICAL FUSION/FORAMINOTOMY LEVEL 3;  Surgeon:  Coletta Memos, MD;  Location: [facility] OR;  Service: Neurosurgery;  Laterality:  N/A;  C3-6 posterior cervical arthrodesis with instrumentation  . Radiology with anesthesia Left 04/30/2015    Procedure: MRI BRAIN WITHOUT CONTRAST, MRI CERVICAL SPINE WITH AND  WITHOUT CONTRAST;  Surgeon: Medication Radiologist, MD;  Location: [facility] OR;   Service: Radiology;  Laterality: Left;  DR. Derwood Kaplan   HPI:  Other Pertinent Information: 79 year old white male sent from nursing home  with encephalopathy and left-sided weakness. Had a recent hospitalization  after a fall. At that time, was found to have C-spine is soft tissue  injury and fracture of anterior C3-4 osteophyte with stenosis noted and  cord edema. On 9/20, had C3, 4, 5 and 6 laminectomies with posterior  arthrodesis of C3-6, morselized allograft by Dr. Mikal Plane. In the emergency  room, found to be in acute renal failure and have a urinary tract  infection. CT brain was negative for anything acute. PMH of CVA, alcohol   abuse. FEES complete 04/22/15 and recommended dysphagia 3 with nectar thick  liquids.   No Data Recorded  Assessment / Plan / Recommendation CHL IP CLINICAL IMPRESSIONS 05/02/2015  Therapy Diagnosis Moderate pharyngeal phase dysphagia  Clinical Impression Pt demonstrates a moderate oropharyngeal dysphagia  with decreased strength and mobility of the hyolaryngeal complex resulting  slightly decreased laryngeal closure and intermittent trace penetration of  thin liquids and mild to moderate residuals. An intermittent throat clear  and second swallow clears vesitibule of minimal penetrate. No aspiration  observed. Recommend pt upgrade to regular texture diet and thin liqudis  with training for a second swallow and intermittent throat clear.       CHL IP TREATMENT RECOMMENDATION 05/02/2015  Treatment Recommendations Therapy as outlined in treatment plan below     CHL IP DIET RECOMMENDATION 05/02/2015  SLP Diet Recommendations Age appropriate regular solids;Thin  Liquid Administration via (None)  Medication Administration Whole meds with puree  Compensations Minimize environmental distractions;Slow rate;Small  sips/bites  Postural Changes and/or Swallow Maneuvers (None)     CHL IP OTHER RECOMMENDATIONS 05/02/2015  Recommended Consults (None)  Oral Care Recommendations Oral care BID  Other Recommendations (None)     CHL IP FOLLOW UP RECOMMENDATIONS 04/22/2015  Follow up Recommendations 24 hour supervision/assistance;Skilled Nursing  facility     CHL IP FREQUENCY AND DURATION 05/02/2015  Speech Therapy Frequency (ACUTE ONLY) min 2x/week  Treatment Duration 2 weeks     Pertinent Vitals/Pain NA    SLP Swallow Goals No flowsheet data found.  No flowsheet data found.    CHL IP REASON FOR REFERRAL 05/02/2015  Reason for Referral Objectively evaluate swallowing function     CHL IP ORAL PHASE 05/02/2015  Lips (None)  Tongue (None)  Mucous membranes (None)  Nutritional status (None)  Other (None)  Oxygen therapy (None)  Oral Phase Impaired   Oral - Pudding Teaspoon (None)  Oral - Pudding Cup (None)  Oral - Honey Teaspoon (None)  Oral - Honey Cup (None)  Oral - Honey Syringe (None)  Oral - Nectar Teaspoon (None)  Oral - Nectar Cup (None)  Oral - Nectar Straw (None)  Oral - Nectar Syringe (None)  Oral - Ice Chips (None)  Oral - Thin Teaspoon (None)  Oral - Thin Cup (None)  Oral - Thin Straw (None)  Oral - Thin Syringe (None)  Oral - Puree (None)  Oral - Mechanical Soft (None)  Oral - Regular (None)  Oral - Multi-consistency (None)  Oral - Pill (None)  Oral Phase - Comment (None)      CHL IP PHARYNGEAL PHASE 05/02/2015  Pharyngeal Phase Impaired  Pharyngeal - Pudding Teaspoon (None)  Penetration/Aspiration details (pudding teaspoon) (None)  Pharyngeal - Pudding Cup (None)  Penetration/Aspiration details (pudding cup) (None)  Pharyngeal - Honey Teaspoon (None)  Penetration/Aspiration details (honey teaspoon) (None)  Pharyngeal - Honey Cup (None)  Penetration/Aspiration details (honey cup) (None)  Pharyngeal - Honey Syringe (None)  Penetration/Aspiration details (honey syringe) (None)  Pharyngeal - Nectar Teaspoon (None)  Penetration/Aspiration details (nectar teaspoon) (None)  Pharyngeal - Nectar Cup (None)  Penetration/Aspiration details (nectar cup) (None)  Pharyngeal - Nectar Straw (None)  Penetration/Aspiration details (nectar straw) (None)  Pharyngeal - Nectar Syringe (None)  Penetration/Aspiration details (nectar syringe) (None)  Pharyngeal - Ice Chips (None)  Penetration/Aspiration details (ice chips) (None)  Pharyngeal - Thin Teaspoon (None)  Penetration/Aspiration details (thin teaspoon) (None)  Pharyngeal - Thin Cup (None)  Penetration/Aspiration details (thin cup) (None)  Pharyngeal - Thin Straw (None)  Penetration/Aspiration details (thin straw) (None)  Pharyngeal - Thin Syringe (None)  Penetration/Aspiration details (thin syringe') (None)  Pharyngeal - Puree (None)  Penetration/Aspiration details (puree) (None)  Pharyngeal - Mechanical Soft  (None)  Penetration/Aspiration details (mechanical soft) (None)  Pharyngeal - Regular (None)  Penetration/Aspiration details (regular) (None)  Pharyngeal - Multi-consistency (None)  Penetration/Aspiration details (multi-consistency) (None)  Pharyngeal - Pill (None)  Penetration/Aspiration details (pill) (None)  Pharyngeal Comment (None)      CHL IP CERVICAL ESOPHAGEAL PHASE 04/22/2015  Cervical Esophageal Phase Impaired  Pudding Teaspoon (None)  Pudding Cup (None)  Honey Teaspoon (None)  Honey Cup (None)  Honey Straw (None)  Nectar Teaspoon (None)  Nectar Cup (None)  Nectar Straw (None)  Nectar Sippy Cup (None)  Thin Teaspoon (None)  Thin Cup (None)  Thin Straw Esophageal backflow into the pharynx;Esophageal backflow into  the larynx  Thin Sippy Cup (None)  Cervical Esophageal Comment from the interarytnoid space which was then  aspirated, sensation intact and cough cleared aspirates with multple  swallows clearing residual     No flowsheet data found.  Harlon Ditty, MA CCC-SLP [phone]  Claudine Mouton 05/02/2015, 3:35 PM     Objective  Filed Vitals:   05/03/15 0758 05/03/15 1007 05/03/15 1008 05/03/15 1300  BP: 146/49 152/44  132/48  Pulse: 71  72 71  Temp: 98.1 F (36.7 C)   98.7 F (37.1 C)  TempSrc: Oral   Oral  Resp: 20   20  Height:      Weight:      SpO2: 94%   94%    Intake/Output Summary (Last 24 hours) at 05/03/15 1342 Last data filed at 05/03/15 0700  Gross per 24 hour  Intake 6962.5 ml  Output    850 ml  Net 6112.5 ml   Filed Weights   04/29/15 0426 05/02/15 0424 05/03/15 0416  Weight: 95.391 kg (210 lb 4.8 oz) 98.793 kg (217 lb 12.8 oz) 98.431 kg (217 lb)   Exam:  GENERAL: NAD, confused  HEENT: head NCAT, no scleral icterus. Pupils round and reactive.  NECK: Supple. Collar in place, dressing CDI  LUNGS: Clear to auscultation. No wheezing or crackles  HEART: Regular rate and rhythm without murmur. 2+ pulses, no JVD, no peripheral edema  ABDOMEN:  Soft, nontender, and nondistended. Positive bowel sounds.   EXTREMITIES: Without any cyanosis, clubbing, rashes  NEUROLOGIC: left sided weakness 1/5, stronger on right.  Data Reviewed: Basic Metabolic Panel:  Recent Labs Lab 04/27/15 1832 04/28/15 0432 04/29/15 0533 04/30/15 0715 05/01/15 0435 05/02/15 0314  NA 141 146* 139 142 145 138  K 4.3 4.3 3.5 4.1 4.3 4.6  CL 107 113* 111 111 114* 108  CO2 22 23 22  20* 21* 20*  GLUCOSE 142* 150* 131* 117* 105* 113*  BUN 97* 82* 43* 25* 20 16  CREATININE 2.22* 1.66* 1.05 0.83 0.91 0.84  CALCIUM 8.5* 8.6* 7.9* 8.5* 8.3* 7.9*  MG 3.2*  --   --   --   --   --    Liver Function Tests:  Recent Labs Lab 04/27/15 1832 04/28/15 0432  AST 41 51*  ALT 50 53  ALKPHOS 101 101  BILITOT 1.1 1.2  PROT 6.1* 6.1*  ALBUMIN 2.6* 2.4*    Recent Labs Lab 04/28/15 0432  AMMONIA 18   CBC:  Recent Labs Lab 04/27/15 1832 04/28/15 0432 04/29/15 0533 04/30/15 0715 05/01/15 0435 05/02/15 0314  WBC 9.4 7.8 6.3 6.3 5.5 6.3  NEUTROABS 7.8* 7.0  --   --   --   --   HGB 8.5* 8.0* 8.2* 9.0* 8.2* 10.4*  HCT 26.4* 25.5* 25.2* 27.3* 25.0* 31.1*  MCV 101.9* 102.4* 101.6* 99.6 101.2* 98.7  PLT 332 316 304 322 291 189   CBG:  Recent Labs Lab 04/27/15 1833  GLUCAP 120*    Recent Results (from the past 240 hour(s))  Urine culture     Status: None   Collection Time: 04/27/15  7:59 PM  Result Value Ref Range Status   Specimen Description URINE, CATHETERIZED  Final   Special Requests NONE  Final   Culture NO GROWTH 1 DAY  Final   Report Status 04/28/2015 FINAL  Final  Culture, blood (x 2)     Status: None   Collection Time: 04/27/15 10:00 PM  Result Value Ref Range Status   Specimen Description BLOOD LEFT ARM  Final   Special Requests BOTTLES DRAWN AEROBIC AND ANAEROBIC 2CC  Final   Culture  Setup Time   Final    GRAM POSITIVE COCCI IN CLUSTERS IN BOTH AEROBIC AND ANAEROBIC BOTTLES  CRITICAL RESULT CALLED TO, READ BACK BY AND VERIFIED  WITH: Silvestre Moment RN 2133 04/28/15 A BROWNING    Culture STAPHYLOCOCCUS SPECIES (COAGULASE NEGATIVE)  Final   Report Status 05/01/2015 FINAL  Final   Organism ID, Bacteria STAPHYLOCOCCUS SPECIES (COAGULASE NEGATIVE)  Final      Susceptibility   Staphylococcus species (coagulase negative) - MIC*    CIPROFLOXACIN >=8 RESISTANT Resistant     ERYTHROMYCIN >=8 RESISTANT Resistant     GENTAMICIN 8 INTERMEDIATE Intermediate     OXACILLIN >=4 RESISTANT Resistant     TETRACYCLINE 2 SENSITIVE Sensitive     VANCOMYCIN 2 SENSITIVE Sensitive     TRIMETH/SULFA 80 RESISTANT Resistant     CLINDAMYCIN >=8 RESISTANT Resistant     RIFAMPIN <=0.5 SENSITIVE Sensitive     Inducible Clindamycin NEGATIVE Sensitive     * STAPHYLOCOCCUS SPECIES (COAGULASE NEGATIVE)  Culture, blood (x 2)     Status: None   Collection Time: 04/27/15 10:15 PM  Result Value Ref Range Status   Specimen Description BLOOD RIGHT ARM  Final   Special Requests BOTTLES DRAWN AEROBIC AND ANAEROBIC 5CC  Final   Culture  Setup Time   Final    GRAM POSITIVE COCCI IN CLUSTERS BOTTLES DRAWN AEROBIC ONLY CRITICAL RESULT CALLED TO, READ BACK BY AND VERIFIED WITH: M CRAVEN  04/29/15 MKELLY    Culture   Final    STAPHYLOCOCCUS SPECIES (COAGULASE NEGATIVE) SUSCEPTIBILITIES PERFORMED ON PREVIOUS CULTURE WITHIN THE LAST 5 DAYS.    Report Status 05/01/2015 FINAL  Final  Culture, blood (routine x 2)     Status: None (Preliminary result)   Collection Time: 04/30/15  6:55 PM  Result Value Ref Range Status   Specimen Description BLOOD LEFT HAND  Final   Special Requests BOTTLES DRAWN AEROBIC AND ANAEROBIC 10CC  Final   Culture NO GROWTH 3 DAYS  Final   Report Status PENDING  Incomplete  Culture, blood (routine x 2)     Status: None (Preliminary result)   Collection Time: 04/30/15  7:03 PM  Result Value Ref Range Status   Specimen Description BLOOD LEFT ANTECUBITAL  Final   Special Requests BOTTLES DRAWN AEROBIC AND ANAEROBIC 10CC  Final    Culture NO GROWTH 3 DAYS  Final   Report Status PENDING  Incomplete     Scheduled Meds: . amLODipine  5 mg Oral Daily  . antiseptic oral rinse  7 mL Mouth Rinse BID  . aspirin  81 mg Oral Daily  . feeding supplement (ENSURE ENLIVE)  237 mL Oral BID BM  . folic acid  1 mg Oral Daily  . latanoprost  1 drop Right Eye QHS  . metoprolol  25 mg Oral BID  . pantoprazole  40 mg Oral Daily  . pravastatin  40 mg Oral q1800  . senna  1 tablet Oral BID  . sodium chloride  3 mL Intravenous Q12H  . thiamine  100 mg Oral Daily  . vancomycin  1,000 mg Intravenous Q12H    Continuous Infusions: . 0.9 % NaCl with KCl 20 mEq / L 75 mL/hr at 05/03/15 0200  . lactated ringers 50 mL/hr (04/30/15 1115)    Pamella Pert, MD [facility] Pager [phone]. If 7 PM - 7 AM, please contact night-coverage at [email or web address], password [facility] 05/03/2015, 1:42 PM  LOS: 6 days

## 2015-05-03 NOTE — Progress Notes (Signed)
Speech Language Pathology Treatment: Dysphagia  Patient Details Name: Charles Bridges MRN: [id number] DOB: 1936/04/02 Today's Date: 05/03/2015 Time: 4098-1191 SLP Time Calculation (min) (ACUTE ONLY): 18 min  Assessment / Plan / Recommendation Clinical Impression  Skilled observation using minimal verbal cues to keep pt focused on po intake with decreasing environmental distractions, use compensatory strategies for clearing throat prn and slow rate, small amounts of liquids/food during intake, and subsequent swallows as needed.  Pt consumed regular/thin consistencies during observation of po intake and he utilized strategies with minimal input from SLP for throat clearing and multiple swallows.  Environmental distractions impacted his swallowing somewhat and this was discussed with wife/pt during mealtime/snacks, he may want to limit activities while eating/drinking to decrease risk for potential aspiration event. Continue current diet with swallowing precautions listed above.   HPI Other Pertinent Information: 79 year old white male sent from nursing home with encephalopathy and left-sided weakness. Had a recent hospitalization after a fall. At that time, was found to have C-spine is soft tissue injury and fracture of anterior C3-4 osteophyte with stenosis noted and cord edema. On 9/20, had C3, 4, 5 and 6 laminectomies with posterior arthrodesis of C3-6, morselized allograft by Dr. Mikal Plane. In the emergency room, found to be in acute renal failure and have a urinary tract infection. CT brain was negative for anything acute. PMH of CVA, alcohol abuse. FEES complete 04/22/15 and recommended dysphagia 3 with nectar thick liquids.    Pertinent Vitals Pain Assessment: No/denies pain  SLP Plan  Continue with current plan of care    Recommendations Diet recommendations: Regular;Thin liquid Liquids provided via: Cup;Straw Medication Administration: Whole meds with puree Compensations: Minimize  environmental distractions;Slow rate;Small sips/bites Postural Changes and/or Swallow Maneuvers: Seated upright 90 degrees              Oral Care Recommendations: Oral care BID Plan: Continue with current plan of care         Geraldina Parrott,PAT, M.S., CCC-SLP 05/03/2015, 4:02 PM

## 2015-05-04 LAB — BASIC METABOLIC PANEL
ANION GAP: 8 (ref 5–15)
BUN: 11 mg/dL (ref 6–20)
CALCIUM: 8.3 mg/dL — AB (ref 8.9–10.3)
CO2: 22 mmol/L (ref 22–32)
Chloride: 106 mmol/L (ref 101–111)
Creatinine, Ser: 0.85 mg/dL (ref 0.61–1.24)
GFR calc Af Amer: >60 mL/min (ref >60)
GLUCOSE: 120 mg/dL — AB (ref 65–99)
POTASSIUM: 3.8 mmol/L (ref 3.5–5.1)
SODIUM: 136 mmol/L (ref 135–145)

## 2015-05-04 LAB — VANCOMYCIN, TROUGH: VANCOMYCIN TR: 19 ug/mL (ref 10.0–20.0)

## 2015-05-04 MED ORDER — SODIUM CHLORIDE 0.9 % IJ SOLN
10.0000 mL | Freq: Two times a day (BID) | INTRAMUSCULAR | Status: DC
  Administered 2015-05-04 – 2015-05-05 (×2): 10 mL

## 2015-05-04 MED ORDER — SODIUM CHLORIDE 0.9 % IJ SOLN: 10.0000 mL | INTRAMUSCULAR | Status: DC | PRN

## 2015-05-04 NOTE — Progress Notes (Signed)
ANTIBIOTIC CONSULT NOTE - FOLLOW UP  Pharmacy Consult for vancomycin Indication: bacteremia  Labs:  Recent Labs  05/01/15 0435 05/02/15 0314  WBC 5.5 6.3  HGB 8.2* 10.4*  PLT 291 189  CREATININE 0.91 0.84   Estimated Creatinine Clearance: 91.7 mL/min (by C-G formula based on Cr of 0.84).  Recent Labs  05/03/15 2344  VANCOTROUGH 19     Microbiology: Recent Results (from the past 720 hour(s))  Surgical pcr screen     Status: Abnormal   Collection Time: 04/15/15  7:55 AM  Result Value Ref Range Status   MRSA, PCR NEGATIVE NEGATIVE Final   Staphylococcus aureus POSITIVE (A) NEGATIVE Final    Comment:        The Xpert SA Assay (FDA approved for NASAL specimens in patients over 16 years of age), is one component of a comprehensive surveillance program.  Test performance has been validated by [facility] for patients greater than or equal to 18 year old. It is not intended to diagnose infection nor to guide or monitor treatment.   Urine culture     Status: None   Collection Time: 04/27/15  7:59 PM  Result Value Ref Range Status   Specimen Description URINE, CATHETERIZED  Final   Special Requests NONE  Final   Culture NO GROWTH 1 DAY  Final   Report Status 04/28/2015 FINAL  Final  Culture, blood (x 2)     Status: None   Collection Time: 04/27/15 10:00 PM  Result Value Ref Range Status   Specimen Description BLOOD LEFT ARM  Final   Special Requests BOTTLES DRAWN AEROBIC AND ANAEROBIC 2CC  Final   Culture  Setup Time   Final    GRAM POSITIVE COCCI IN CLUSTERS IN BOTH AEROBIC AND ANAEROBIC BOTTLES CRITICAL RESULT CALLED TO, READ BACK BY AND VERIFIED WITH: Silvestre Moment RN 2133 04/28/15 A BROWNING    Culture STAPHYLOCOCCUS SPECIES (COAGULASE NEGATIVE)  Final   Report Status 05/01/2015 FINAL  Final   Organism ID, Bacteria STAPHYLOCOCCUS SPECIES (COAGULASE NEGATIVE)  Final      Susceptibility   Staphylococcus species (coagulase negative) - MIC*    CIPROFLOXACIN >=8  RESISTANT Resistant     ERYTHROMYCIN >=8 RESISTANT Resistant     GENTAMICIN 8 INTERMEDIATE Intermediate     OXACILLIN >=4 RESISTANT Resistant     TETRACYCLINE 2 SENSITIVE Sensitive     VANCOMYCIN 2 SENSITIVE Sensitive     TRIMETH/SULFA 80 RESISTANT Resistant     CLINDAMYCIN >=8 RESISTANT Resistant     RIFAMPIN <=0.5 SENSITIVE Sensitive     Inducible Clindamycin NEGATIVE Sensitive     * STAPHYLOCOCCUS SPECIES (COAGULASE NEGATIVE)  Culture, blood (x 2)     Status: None   Collection Time: 04/27/15 10:15 PM  Result Value Ref Range Status   Specimen Description BLOOD RIGHT ARM  Final   Special Requests BOTTLES DRAWN AEROBIC AND ANAEROBIC 5CC  Final   Culture  Setup Time   Final    GRAM POSITIVE COCCI IN CLUSTERS BOTTLES DRAWN AEROBIC ONLY CRITICAL RESULT CALLED TO, READ BACK BY AND VERIFIED WITH: M CRAVEN  04/29/15 MKELLY    Culture   Final    STAPHYLOCOCCUS SPECIES (COAGULASE NEGATIVE) SUSCEPTIBILITIES PERFORMED ON PREVIOUS CULTURE WITHIN THE LAST 5 DAYS.    Report Status 05/01/2015 FINAL  Final  Culture, blood (routine x 2)     Status: None (Preliminary result)   Collection Time: 04/30/15  6:55 PM  Result Value Ref Range Status   Specimen Description  BLOOD LEFT HAND  Final   Special Requests BOTTLES DRAWN AEROBIC AND ANAEROBIC 10CC  Final   Culture NO GROWTH 3 DAYS  Final   Report Status PENDING  Incomplete  Culture, blood (routine x 2)     Status: None (Preliminary result)   Collection Time: 04/30/15  7:03 PM  Result Value Ref Range Status   Specimen Description BLOOD LEFT ANTECUBITAL  Final   Special Requests BOTTLES DRAWN AEROBIC AND ANAEROBIC 10CC  Final   Culture NO GROWTH 3 DAYS  Final   Report Status PENDING  Incomplete     Assessment/Plan:  79yo male now therapeutic on vancomycin after dose change.  Plan for vanc 2-6wk depending on echo and repeat blood cx.  Vernard Gambles, PharmD, BCPS  05/04/2015,12:40 AM

## 2015-05-04 NOTE — Progress Notes (Signed)
Peripherally Inserted Central Catheter/Midline Placement  The IV Nurse has discussed with the patient and/or persons authorized to consent for the patient, the purpose of this procedure and the potential benefits and risks involved with this procedure.  The benefits include less needle sticks, lab draws from the catheter and patient may be discharged home with the catheter.  Risks include, but not limited to, infection, bleeding, blood clot (thrombus formation), and puncture of an artery; nerve damage and irregular heat beat.  Alternatives to this procedure were also discussed.  PICC/Midline Placement Documentation  PICC / Midline Single Lumen 05/04/15 PICC Right Brachial 42 cm 0 cm (Active)  Indication for Insertion or Continuance of Line Home intravenous therapies (PICC only) 05/04/2015  1:19 PM  Exposed Catheter (cm) 0 cm 05/04/2015  1:19 PM  Site Assessment Clean;Dry;Intact 05/04/2015  1:19 PM  Line Status Flushed;Saline locked;Blood return noted 05/04/2015  1:19 PM  Dressing Type Transparent 05/04/2015  1:19 PM  Dressing Change Due 05/11/15 05/04/2015  1:19 PM       Ethelda Chick 05/04/2015, 1:20 PM

## 2015-05-04 NOTE — Progress Notes (Signed)
PROGRESS NOTE  Charles Bridges ZOX:[id number] DOB: [birth year] DOA: 04/27/2015 PCP: Ailene Ravel, MD  HPI: 79 year old white male sent from nursing home with encephalopathy and left-sided weakness. Had a recent hospitalization after a fall. At that time, was found to have C-spine is soft tissue injury and fracture of anterior C3-4 osteophyte with stenosis noted and cord edema. On 9/20, had C3, 4, 5 and 6 laminectomies with posterior arthrodesis of C3-6, morselized allograft by Dr. Mikal Plane. In the emergency room, found to be in acute renal failure and have a urinary tract infection. CT brain was negative for anything acute.   Subjective / 24 H Interval events - back pain today - no chest pain, shortness of breath, no abdominal pain, nausea or vomiting.   Assessment/Plan: Principal Problem:   Acute encephalopathy Active Problems:   Carotid stenosis   Essential hypertension   HLD (hyperlipidemia)   History of CVA (cerebrovascular accident)   Alcohol abuse   AKI (acute kidney injury) (HCC)   Chronic indwelling Foley catheter   UTI (urinary tract infection)   Central cord syndrome at C3 level of cervical spinal cord (HCC)   Left-sided weakness   Anemia   Gram-positive cocci bacteremia   Pressure ulcer   Coagulase negative Staphylococcus bacteremia   Urinary tract infectious disease   Coag negative staph bacteremia on cultures on 10/2 - on vancomycin - surveillance cultures sent 10/5, NGTD - MRI as below, no apparent abscess - No drainage or cellulitis at surgical site - ID consulted - TTE without vegetations - afebrile, cultures now negative 4 days, place PICC line  Acute encephalopathy  - improving off of narcotics, due to pain I have restarted oxycodone however became again more confused.  - confused on Vicodin still, mental status fluctuates. If persists, will d/c narcotics altogether  - also secondary to infection, dehydration. Currently on Tylenol only.  - MRI as  below, no acute findings  UTI (urinary tract infection) - culture negative. discontinue Rocephin  Central cord syndrome - S/p C3,4,5,and C6 laminectomies, Posterior arthrodesis C3-6, morselized allograft by Dr. Mikal Plane last hospitalization on 04/15/2015 - Patient has 1 out of 5 movement on his left upper extremity and about 1-2 out of 5 left lower extremity. Right sided strength is about 3-4 out of 5.  - repeat MRI on 10/5 with small fluid collection at surgical site, discussed with Dr. Franky Macho 10/6, and he reviewed images, it appears that this is expected post op seroma.  - off collar per Dr. Franky Macho   AKI (acute kidney injury) - secondary to prerenal azotemia.  - resolved. Stop fluids  Macrocytic anemia - B-12 and folate okay - No reported bleeding. Will heme occult stools. relatively low iron and TIBC with high ferritin suggestive of chronic disease. Monitor.  Carotid stenosis  Essential hypertension - hypertensive, lisinopril/HCTZ held due to renal failure, start Norvasc  HLD (hyperlipidemia)  History of CVA (cerebrovascular accident)  Alcohol abuse - Doubt withdrawal, as patient too far out from last drink, but would continue thiamine.  Chronic indwelling Foley catheter - changed foley  Deconditioning - PT consult  Hypernatremia  - corrected with fluids   Diet: Diet regular Room service appropriate?: Yes; Fluid consistency:: Thin Fluids: NS DVT Prophylaxis: SCD  Code Status: Full Code Family Communication: d/w wife and sister bedside Disposition Plan: SNF when ready   Consultants:  None   Procedures:  None    Antibiotics Ceftriaxone 10/2 >> 10/5 Vancomycin 10/2 >>   Studies  No results found.  Objective  Filed Vitals:   05/04/15 0508 05/04/15 0735 05/04/15 0803 05/04/15 0922  BP:   146/57 165/74  Pulse:    70  Temp: 97.7 F (36.5 C) 97.8 F (36.6 C)    TempSrc: Oral Oral    Resp:   17   Height:      Weight: 102.513 kg (226 lb)       SpO2:  95%      Intake/Output Summary (Last 24 hours) at 05/04/15 1131 Last data filed at 05/04/15 0508  Gross per 24 hour  Intake    240 ml  Output   1175 ml  Net   -935 ml   Filed Weights   05/02/15 0424 05/03/15 0416 05/04/15 0508  Weight: 98.793 kg (217 lb 12.8 oz) 98.431 kg (217 lb) 102.513 kg (226 lb)   Exam:  GENERAL: NAD, confused  HEENT: head NCAT, no scleral icterus. Pupils round and reactive.  NECK: Supple. Collar in place, dressing CDI  LUNGS: Clear to auscultation. No wheezing or crackles  HEART: Regular rate and rhythm without murmur. 2+ pulses, no JVD, no peripheral edema  ABDOMEN: Soft, nontender, and nondistended. Positive bowel sounds.   NEUROLOGIC: left sided weakness 1/5, stronger on right.  Data Reviewed: Basic Metabolic Panel:  Recent Labs Lab 04/27/15 1832 04/28/15 0432 04/29/15 0533 04/30/15 0715 05/01/15 0435 05/02/15 0314  NA 141 146* 139 142 145 138  K 4.3 4.3 3.5 4.1 4.3 4.6  CL 107 113* 111 111 114* 108  CO2 20* 21* 20*  GLUCOSE 142* 150* 131* 117* 105* 113*  BUN 97* 82* 43* 25* 20 16  CREATININE 2.22* 1.66* 1.05 0.83 0.91 0.84  CALCIUM 8.5* 8.6* 7.9* 8.5* 8.3* 7.9*  MG 3.2*  --   --   --   --   --    Liver Function Tests:  Recent Labs Lab 04/27/15 1832 04/28/15 0432  AST 41 51*  ALT 50 53  ALKPHOS 101 101  BILITOT 1.1 1.2  PROT 6.1* 6.1*  ALBUMIN 2.6* 2.4*    Recent Labs Lab 04/28/15 0432  AMMONIA 18   CBC:  Recent Labs Lab 04/27/15 1832 04/28/15 0432 04/29/15 0533 04/30/15 0715 05/01/15 0435 05/02/15 0314  WBC 9.4 7.8 6.3 6.3 5.5 6.3  NEUTROABS 7.8* 7.0  --   --   --   --   HGB 8.5* 8.0* 8.2* 9.0* 8.2* 10.4*  HCT 26.4* 25.5* 25.2* 27.3* 25.0* 31.1*  MCV 101.9* 102.4* 101.6* 99.6 101.2* 98.7  PLT 332 316 304 322 291 189   CBG:  Recent Labs Lab 04/27/15 1833  GLUCAP 120*    Recent Results (from the past 240 hour(s))  Urine culture     Status: None   Collection Time: 04/27/15  7:59  PM  Result Value Ref Range Status   Specimen Description URINE, CATHETERIZED  Final   Special Requests NONE  Final   Culture NO GROWTH 1 DAY  Final   Report Status 04/28/2015 FINAL  Final  Culture, blood (x 2)     Status: None   Collection Time: 04/27/15 10:00 PM  Result Value Ref Range Status   Specimen Description BLOOD LEFT ARM  Final   Special Requests BOTTLES DRAWN AEROBIC AND ANAEROBIC 2CC  Final   Culture  Setup Time   Final    GRAM POSITIVE COCCI IN CLUSTERS IN BOTH AEROBIC AND ANAEROBIC BOTTLES CRITICAL RESULT CALLED TO, READ BACK BY AND VERIFIED WITH: Silvestre Moment RN [id number] 04/28/15 A  BROWNING    Culture STAPHYLOCOCCUS SPECIES (COAGULASE NEGATIVE)  Final   Report Status 05/01/2015 FINAL  Final   Organism ID, Bacteria STAPHYLOCOCCUS SPECIES (COAGULASE NEGATIVE)  Final      Susceptibility   Staphylococcus species (coagulase negative) - MIC*    CIPROFLOXACIN >=8 RESISTANT Resistant     ERYTHROMYCIN >=8 RESISTANT Resistant     GENTAMICIN 8 INTERMEDIATE Intermediate     OXACILLIN >=4 RESISTANT Resistant     TETRACYCLINE 2 SENSITIVE Sensitive     VANCOMYCIN 2 SENSITIVE Sensitive     TRIMETH/SULFA 80 RESISTANT Resistant     CLINDAMYCIN >=8 RESISTANT Resistant     RIFAMPIN <=0.5 SENSITIVE Sensitive     Inducible Clindamycin NEGATIVE Sensitive     * STAPHYLOCOCCUS SPECIES (COAGULASE NEGATIVE)  Culture, blood (x 2)     Status: None   Collection Time: 04/27/15 10:15 PM  Result Value Ref Range Status   Specimen Description BLOOD RIGHT ARM  Final   Special Requests BOTTLES DRAWN AEROBIC AND ANAEROBIC 5CC  Final   Culture  Setup Time   Final    GRAM POSITIVE COCCI IN CLUSTERS BOTTLES DRAWN AEROBIC ONLY CRITICAL RESULT CALLED TO, READ BACK BY AND VERIFIED WITH: M CRAVEN @0154  04/29/15 MKELLY    Culture   Final    STAPHYLOCOCCUS SPECIES (COAGULASE NEGATIVE) SUSCEPTIBILITIES PERFORMED ON PREVIOUS CULTURE WITHIN THE LAST 5 DAYS.    Report Status 05/01/2015 FINAL  Final  Culture, blood  (routine x 2)     Status: None (Preliminary result)   Collection Time: 04/30/15  6:55 PM  Result Value Ref Range Status   Specimen Description BLOOD LEFT HAND  Final   Special Requests BOTTLES DRAWN AEROBIC AND ANAEROBIC 10CC  Final   Culture NO GROWTH 4 DAYS  Final   Report Status PENDING  Incomplete  Culture, blood (routine x 2)     Status: None (Preliminary result)   Collection Time: 04/30/15  7:03 PM  Result Value Ref Range Status   Specimen Description BLOOD LEFT ANTECUBITAL  Final   Special Requests BOTTLES DRAWN AEROBIC AND ANAEROBIC 10CC  Final   Culture NO GROWTH 4 DAYS  Final   Report Status PENDING  Incomplete     Scheduled Meds: . amLODipine  5 mg Oral Daily  . antiseptic oral rinse  7 mL Mouth Rinse BID  . aspirin  81 mg Oral Daily  . feeding supplement (ENSURE ENLIVE)  237 mL Oral BID BM  . folic acid  1 mg Oral Daily  . latanoprost  1 drop Right Eye QHS  . metoprolol  25 mg Oral BID  . pantoprazole  40 mg Oral Daily  . pravastatin  40 mg Oral q1800  . senna  1 tablet Oral BID  . sodium chloride  3 mL Intravenous Q12H  . thiamine  100 mg Oral Daily  . vancomycin  1,000 mg Intravenous Q12H    Continuous Infusions: . 0.9 % NaCl with KCl 20 mEq / L 75 mL/hr at 05/04/15 0615  . lactated ringers 50 mL/hr (04/30/15 1115)    Pamella Pert, MD [facility] Pager [phone]. If 7 PM - 7 AM, please contact night-coverage at [email or web address], password [email or web address] 05/04/2015, 11:31 AM  LOS: 7 days

## 2015-05-05 DIAGNOSIS — D649 Anemia, unspecified: Secondary | ICD-10-CM

## 2015-05-05 LAB — CULTURE, BLOOD (ROUTINE X 2)
Culture: NO GROWTH
Culture: NO GROWTH

## 2015-05-05 MED ORDER — HYDROCODONE-ACETAMINOPHEN 5-325 MG PO TABS: 1.0000 | ORAL_TABLET | Freq: Four times a day (QID) | ORAL | Status: DC | PRN

## 2015-05-05 MED ORDER — VANCOMYCIN HCL IN DEXTROSE 1-5 GM/200ML-% IV SOLN: 1000.0000 mg | Freq: Two times a day (BID) | INTRAVENOUS | Status: DC

## 2015-05-05 MED ORDER — HEPARIN SOD (PORK) LOCK FLUSH 100 UNIT/ML IV SOLN
500.0000 [IU] | Freq: Once | INTRAVENOUS | Status: AC
  Administered 2015-05-05: 500 [IU] via INTRAVENOUS
  Filled 2015-05-05: qty 5

## 2015-05-05 MED FILL — Phenylephrine-NaCl Pref Syr 0.4 MG/10ML-0.9% (40 MCG/ML): INTRAVENOUS | Qty: 10 | Status: AC

## 2015-05-05 MED FILL — Lidocaine HCl IV Inj 20 MG/ML: INTRAVENOUS | Qty: 5 | Status: AC

## 2015-05-05 MED FILL — Propofol IV Emul 200 MG/20ML (10 MG/ML): INTRAVENOUS | Qty: 20 | Status: AC

## 2015-05-05 MED FILL — Lactated Ringer's Solution: INTRAVENOUS | Qty: 1000 | Status: AC

## 2015-05-05 MED FILL — Succinylcholine Chloride Inj 20 MG/ML: INTRAMUSCULAR | Qty: 5 | Status: AC

## 2015-05-05 MED FILL — Ondansetron HCl Inj 4 MG/2ML (2 MG/ML): INTRAMUSCULAR | Qty: 2 | Status: AC

## 2015-05-05 NOTE — Discharge Instructions (Signed)
Follow with HAMRICK,MAURA L, MD in 5-7 days  Please get a complete blood count and chemistry panel checked by your Primary MD at your next visit, and again as instructed by your Primary MD. Please get your medications reviewed and adjusted by your Primary MD.  Please request your Primary MD to go over all Hospital Tests and Procedure/Radiological results at the follow up, please get all Hospital records sent to your Prim MD by signing hospital release before you go home.  If you had Pneumonia of Lung problems at the Hospital: Please get a 2 view Chest X ray done in 6-8 weeks after hospital discharge or sooner if instructed by your Primary MD.  If you have Congestive Heart Failure: Please call your Cardiologist or Primary MD anytime you have any of the following symptoms:  1) 3 pound weight gain in 24 hours or 5 pounds in 1 week  2) shortness of breath, with or without a dry hacking cough  3) swelling in the hands, feet or stomach  4) if you have to sleep on extra pillows at night in order to breathe  Follow cardiac low salt diet and 1.5 lit/day fluid restriction.  If you have diabetes Accuchecks 4 times/day, Once in AM empty stomach and then before each meal. Log in all results and show them to your primary doctor at your next visit. If any glucose reading is under 80 or above 300 call your primary MD immediately.  If you have Seizure/Convulsions/Epilepsy: Please do not drive, operate heavy machinery, participate in activities at heights or participate in high speed sports until you have seen by Primary MD or a Neurologist and advised to do so again.  If you had Gastrointestinal Bleeding: Please ask your Primary MD to check a complete blood count within one week of discharge or at your next visit. Your endoscopic/colonoscopic biopsies that are pending at the time of discharge, will also need to followed by your Primary MD.  Get Medicines reviewed and adjusted. Please take all your  medications with you for your next visit with your Primary MD  Please request your Primary MD to go over all hospital tests and procedure/radiological results at the follow up, please ask your Primary MD to get all Hospital records sent to his/her office.  If you experience worsening of your admission symptoms, develop shortness of breath, life threatening emergency, suicidal or homicidal thoughts you must seek medical attention immediately by calling 911 or calling your MD immediately  if symptoms less severe.  You must read complete instructions/literature along with all the possible adverse reactions/side effects for all the Medicines you take and that have been prescribed to you. Take any new Medicines after you have completely understood and accpet all the possible adverse reactions/side effects.   Do not drive or operate heavy machinery when taking Pain medications.   Do not take more than prescribed Pain, Sleep and Anxiety Medications  Special Instructions: If you have smoked or chewed Tobacco  in the last 2 yrs please stop smoking, stop any regular Alcohol  and or any Recreational drug use.  Wear Seat belts while driving.  Please note You were cared for by a hospitalist during your hospital stay. If you have any questions about your discharge medications or the care you received while you were in the hospital after you are discharged, you can call the unit and asked to speak with the hospitalist on call if the hospitalist that took care of you is not available. Once  you are discharged, your primary care physician will handle any further medical issues. Please note that NO REFILLS for any discharge medications will be authorized once you are discharged, as it is imperative that you return to your primary care physician (or establish a relationship with a primary care physician if you do not have one) for your aftercare needs so that they can reassess your need for medications and monitor your  lab values.  You can reach the hospitalist office at phone [phone] or fax [phone]   If you do not have a primary care physician, you can call [phone] for a physician referral.  Activity: As tolerated with Full fall precautions use walker/cane & assistance as needed  Diet: heart healthy  Disposition SNF

## 2015-05-05 NOTE — Progress Notes (Signed)
Physical Therapy Treatment Patient Details Name: Charles Bridges MRN: [id number] DOB: 07-Dec-1935 Today's Date: 05/05/2015    History of Present Illness Pt admitted for Acute encephalopathy. Recent fall and post-C3, 4, 5 and 6 laminectomies with posterior arthrodesis of C3-6, morselized allograft by Dr. Mikal Plane. Upon admission pt found to be in acute renal failure and have a urinary tract infection and bilateral heel ulcers. CT brain was negative.     PT Comments    Pt with LEs OOB and slid down in bed on arrival.  Pt repositioned in bed with no A from pt despite cueing to attempt to use UEs.  Unable to find pt's C-Collar in room and pt unaware of location of collar.  Last admit pt was to be wearing collar at all times, please clarify if pt needs to continue to wear C-collar and possibly re-order new collar as old one is no longer in room.    Follow Up Recommendations  SNF     Equipment Recommendations  None recommended by PT    Recommendations for Other Services       Precautions / Restrictions Precautions Precautions: Fall;Cervical Precaution Comments: pt not wearing brace and no brace in room.  Last admit C-collar at all times.   Restrictions Weight Bearing Restrictions: No    Mobility  Bed Mobility Overal bed mobility: Needs Assistance;+2 for physical assistance Bed Mobility: Rolling Rolling: Total assist;+2 for physical assistance         General bed mobility comments: pt rolled with total A to reposition in bed while keep head/neck supported.    Transfers                    Ambulation/Gait                 Stairs            Wheelchair Mobility    Modified Rankin (Stroke Patients Only)       Balance                                    Cognition Arousal/Alertness: Awake/alert Behavior During Therapy: WFL for tasks assessed/performed Overall Cognitive Status: Impaired/Different from baseline Area of Impairment:  Orientation;Attention;Memory;Following commands Orientation Level: Disoriented to;Place;Time Current Attention Level: Sustained Memory: Decreased short-term memory Following Commands: Follows multi-step commands inconsistently       General Comments: 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      Exercises      General Comments        Pertinent Vitals/Pain Pain Assessment: Faces Faces Pain Scale: Hurts even more Pain Location: pt indicates mid back pain.  Pain Descriptors / Indicators: Aching Pain Intervention(s): Monitored during session;Repositioned    Home Living                      Prior Function            PT Goals (current goals can now be found in the care plan section) Acute Rehab PT Goals Patient Stated Goal: To get OOB PT Goal Formulation: With patient Time For Goal Achievement: 05/16/15 Potential to Achieve Goals: Good Progress towards PT goals: Progressing toward goals    Frequency  Min 3X/week    PT Plan Current plan remains appropriate    Co-evaluation             End  of Session   Activity Tolerance: Patient limited by pain Patient left: in bed;with call bell/phone within reach (with SLP)     Time: 4098-1191 PT Time Calculation (min) (ACUTE ONLY): 11 min  Charges:  $Therapeutic Activity: 8-22 mins                    G CodesSunny Schlein, [place] [phone] 05/05/2015, 9:15 AM

## 2015-05-05 NOTE — Care Management Important Message (Signed)
Important Message  Patient Details  Name: Charles Bridges MRN: [id number] Date of Birth: 09-04-1935   Medicare Important Message Given:  Yes-third notification given    Kyla Balzarine 05/05/2015, 11:46 AM

## 2015-05-05 NOTE — Care Management Note (Signed)
Case Management Note  Patient Details  Name: Charles Bridges MRN: [id number] Date of Birth: 07-16-1936  Subjective/Objective:   Acute encephalopathy, fall                 Action/Plan: SNF  Expected Discharge Date:  05/05/2015               Expected Discharge Plan:  Skilled Nursing Facility  In-House Referral:  Clinical Social Work  Discharge planning Services  CM Consult   Status of Service:  Completed, signed off  Medicare Important Message Given:  Yes-second notification given Date Medicare IM Given:    Medicare IM give by:    Date Additional Medicare IM Given:    Additional Medicare Important Message give by:     If discussed at Long Length of Stay Meetings, dates discussed:    Additional Comments:  Elliot Cousin, RN 05/05/2015, 11:24 AM

## 2015-05-05 NOTE — Discharge Summary (Signed)
Physician Discharge Summary  Charles Bridges WUJ:[id number] DOB: November 18, 1935 DOA: 04/27/2015  PCP: Ailene Ravel, MD  Admit date: 04/27/2015 Discharge date: 05/05/2015  Time spent: > 40 minutes  Recommendations for Outpatient Follow-up:  1. Follow up with Dr. Franky Macho in 2-3 weeks 2. Follow up with Dr. Nathanial Rancher in 2-3 weeks 3. Follow up with outpatient ID in 2-3 weeks  4. Continue Vancomycin 1000 mg BID for 4 weeks from most recent negative blood cultures, end date 05/28/2015. 5. PICC Line care per protocol 6. Discontinue PICC Line once antibiotic treatment finished 7. Monitor weekly BMP / CBC and Vancomycin troughs per pharmacy  Discharge Diagnoses:  Principal Problem:   Acute encephalopathy Active Problems:   Carotid stenosis   Essential hypertension   HLD (hyperlipidemia)   History of CVA (cerebrovascular accident)   Alcohol abuse   AKI (acute kidney injury) (HCC)   Chronic indwelling Foley catheter   UTI (urinary tract infection)   Central cord syndrome at C3 level of cervical spinal cord (HCC)   Left-sided weakness   Anemia   Gram-positive cocci bacteremia   Pressure ulcer   Coagulase negative Staphylococcus bacteremia   Urinary tract infectious disease  Discharge Condition: stable  Diet recommendation: heart healthy  Filed Weights   05/03/15 0416 05/04/15 0508 05/05/15 0440  Weight: 98.431 kg (217 lb) 102.513 kg (226 lb) 98.657 kg (217 lb 8 oz)   History of present illness:  Charles Bridges is a 79 y.o. male with Past medical history of carotid occlusion, dyslipidemia, hypertension, recent C4 fracture repair, diabetes mellitus, history of alcohol use. Patient presents with complaints of confusion. The patient was recently hospitalized for C4 fracture repair. After the fracture the patient was discharged to a rehabilitation facility. In the rehabilitation facility the patient has been regaining his strength in the right upper extremity but started having increasing  episodes of confusion and which is progressively worsening. There was no fall or trauma or injury reported. Patient did not have any nausea vomiting or diarrhea as well as burning urination. The patient has a chronic indwelling Foley catheter. No active bleeding reported as well. No recent change in his medications reported other than taking medication.  Hospital Course:  Coag negative staph bacteremia on cultures on 10/2 - on vancomycin per ID, with recommendations to continue for 4 weeks since most recent negative blood cultures (10/5) with end date 05/28/2015. Source for bacteremia not entirely clear, patient is s/p C3-6 surgery. MRI of C spine showed post op seroma without abscess or other concerning features. He underwent a TTE which was without suspicious vegetations.  Acute encephalopathy - improving of narcotics, his regimen was changed from high dose oxycodone to Norco, with improvement in his mental status. Minimize narcotics if possible.  UTI (urinary tract infection) - culture negative. discontinue Rocephin Central cord syndrome - S/p C3,4,5,and C6 laminectomies, Posterior arthrodesis C3-6, morselized allograft by Dr. Mikal Plane last hospitalization on 04/15/2015. Patient has 1 out of 5 movement on his left upper extremity and about 1-2 out of 5 left lower extremity. Right sided strength is about 3-4 out of 5. Repeat MRI on 10/5 with small fluid collection at surgical site, discussed with Dr. Franky Macho 10/6, and he reviewed images, it appears that this is expected post op seroma. He can comeoff collar per Dr. Franky Macho  AKI (acute kidney injury) - secondary to prerenal azotemia, resolved with hydration.  Macrocytic anemia - B-12 and folate okay, no reported bleeding.  Carotid stenosis Essential hypertension - resume home  medications HLD (hyperlipidemia) History of CVA (cerebrovascular accident) Alcohol abuse - Doubt withdrawal, as patient too far out from last drink, but would continue  thiamine. Chronic indwelling Foley catheter - changed foley. Change every 3-4 weeks Deconditioning - SNF discharge Hypernatremia - corrected with fluids  Procedures:  2D echo Study Conclusions - Left ventricle: The cavity size was normal. Wall thickness was normal. Systolic function was vigorous. The estimated ejection fraction was in the range of 65% to 70%. Wall motion was normal; there were no regional wall motion abnormalities. Left ventricular diastolic function parameters were normal. - Aortic valve: Mildly calcified annulus. Trileaflet; mildly thickened leaflets. Valve area (VTI): 2.7 cm^2. Valve area (Vmax): 2.81 cm^2. - Left atrium: The atrium was mildly dilated. - Pulmonary arteries: Systolic pressure was moderately increased. PA peak pressure: 41 mm Hg (S). - Technically adequate study.  Consultations:  Neurosurgery  Infectious disease  Discharge Exam: Filed Vitals:   05/04/15 1339 05/04/15 2116 05/05/15 0038 05/05/15 0440  BP: 147/53 141/46 135/49 145/58  Pulse: 79 67 66 67  Temp: 97.6 F (36.4 C) 97.7 F (36.5 C) 97.9 F (36.6 C) 98.2 F (36.8 C)  TempSrc: Oral Oral Tympanic Oral  Resp: 22 22 21 22   Height:      Weight:    98.657 kg (217 lb 8 oz)  SpO2: 92% 91% 91% 93%   General: NAD Cardiovascular: RRR Respiratory: CTA biL  Discharge Instructions     Medication List    STOP taking these medications        ciprofloxacin 500 MG tablet  Commonly known as:  CIPRO     Oxycodone HCl 10 MG Tabs      TAKE these medications        aspirin 81 MG chewable tablet  Chew 1 tablet (81 mg total) by mouth daily.     folic acid 1 MG tablet  Commonly known as:  FOLVITE  Take 1 tablet (1 mg total) by mouth daily.     HYDROcodone-acetaminophen 5-325 MG tablet  Commonly known as:  NORCO/VICODIN  Take 1 tablet by mouth every 6 (six) hours as needed for severe pain.     latanoprost 0.005 % ophthalmic solution  Commonly known as:  XALATAN  Place 1 drop into  the right eye at bedtime.     lisinopril-hydrochlorothiazide 20-12.5 MG tablet  Commonly known as:  PRINZIDE,ZESTORETIC  Take 1 tablet by mouth daily.     metoprolol 50 MG tablet  Commonly known as:  LOPRESSOR  Take 1 tablet (50 mg total) by mouth 2 (two) times daily.     multivitamin with minerals Tabs tablet  Take 1 tablet by mouth daily.     pantoprazole 40 MG tablet  Commonly known as:  PROTONIX  Take 1 tablet (40 mg total) by mouth daily.     pravastatin 40 MG tablet  Commonly known as:  PRAVACHOL  Take 40 mg by mouth daily.     senna 8.6 MG Tabs tablet  Commonly known as:  SENOKOT  Take 1 tablet (8.6 mg total) by mouth 2 (two) times daily.     thiamine 100 MG tablet  Take 1 tablet (100 mg total) by mouth daily.     vancomycin 1 GM/200ML Soln  Commonly known as:  VANCOCIN  Inject 200 mLs (1,000 mg total) into the vein every 12 (twelve) hours. End date 05/28/2015           Follow-up Information    Follow up with HAMRICK,MAURA L,  MD. Schedule an appointment as soon as possible for a visit in 2 weeks.   Specialty:  Family Medicine   Contact information:   Dr. Burnell Blanks [address] [place] [address] [phone]       Follow up with [facility]. Schedule an appointment as soon as possible for a visit in 3 weeks.   Contact information:   [address] [place] [address]       Follow up with CABBELL,KYLE L, MD. Schedule an appointment as soon as possible for a visit in 2 weeks.   Specialty:  Neurosurgery   Contact information:   [address] [place] [address] [phone]       The results of significant diagnostics from this hospitalization (including imaging, microbiology, ancillary and laboratory) are listed below for reference.    Significant Diagnostic Studies: Ct Head Wo Contrast  04/27/2015   CLINICAL DATA:  Weakness with altered mental status. Recent cervical  fusion.  EXAM: CT HEAD WITHOUT CONTRAST  TECHNIQUE: Contiguous axial images were obtained from the base of the skull through the vertex without intravenous contrast.  COMPARISON:  CT head 04/13/2015.  FINDINGS: Scout view reveals recently placed posterior cervical fusion hardware. The integrity of the fusion and hardware not established with this CT head exam.  No evidence for acute infarction, hemorrhage, mass lesion, hydrocephalus, or extra-axial fluid. Moderate atrophy. Chronic microvascular ischemic change. Remote RIGHT basilar ganglia and BILATERAL thalamic lacunar infarcts. No occult subdural. No skull fracture. Advanced vascular calcification. Orbits, sinuses, mastoids unremarkable.  IMPRESSION: Chronic changes as described. No significant change from 04/13/2015.  No new areas of infarction, hemorrhage, or extra-axial fluid are observed.   Electronically Signed   By: Elsie Stain M.D.   On: 04/27/2015 18:27   Ct Cervical Spine Wo Contrast  04/14/2015   CLINICAL DATA:  79 year old male post trauma. Weakness. Subsequent encounter.  EXAM: CT CERVICAL SPINE WITHOUT CONTRAST  TECHNIQUE: Multidetector CT imaging of the cervical spine was performed without intravenous contrast. Multiplanar CT image reconstructions were also generated.  COMPARISON:  04/14/2015 cervical spine MR.  FINDINGS: Flowing confluent osteophyte C6 through T1 with fusion across the disc spaces.  Prominent anterior osteophyte C5-6 with areas of fusion of the anterior osteophyte.  On the accompanying motion degraded MR, there is prevertebral edema/hemorrhage. Edema extends across the C4-5 anterior longitudinal ligament and disc space consistent with soft tissue injury. Possible mild edema right C4-5 interlaminar ligament. Fracture of the anterior osteophyte at the C4-5 level (although the borders appear fairly well defined) may have recurrent with hyperextension.  Fracture of the anterior C3-4 osteophyte suspected.  With the patient in a  cervical collar, there is relatively normal alignment of the cervical spine.  Prominent cervical spondylotic changes with various degrees of spinal stenosis and foraminal narrowing most prominent C3-4 thru C5-6 where there is cord compression or cord edema extending from C3 through upper C6 on recent MR.  Patient will be scheduled for repeat MR.  IMPRESSION: On the accompanying motion degraded MR, there is prevertebral edema/hemorrhage. Edema extends across the C4-5 anterior longitudinal ligament and disc space consistent with soft tissue injury. Possible mild edema right C4-5 interlaminar ligament. Fracture of the anterior osteophyte at the C4-5 level (although the borders appear fairly well defined) may have recurrent with hyperextension.  Fracture of the anterior C3-4 osteophyte suspected.  Prominent cervical spondylotic changes with various degrees of spinal stenosis and foraminal narrowing most  prominent C3-4 thru C5-6 where there is cord compression or cord edema extending from C3 through upper C6 on recent MR.  These results were reviewed with Dr. Franky Macho 04/14/2015  at 8:29 pm.   Electronically Signed   By: Lacy Duverney M.D.   On: 04/14/2015 20:55   Mr Brain Wo Contrast  04/30/2015   CLINICAL DATA:  Left sided weakness developed recently. Recent fracture at C3-4 treated with surgery on 04/15/2015.  EXAM: MRI HEAD WITHOUT CONTRAST  MRI CERVICAL SPINE WITHOUT CONTRAST  TECHNIQUE: Multiplanar, multiecho pulse sequences of the brain and surrounding structures, and cervical spine, to include the craniocervical junction and cervicothoracic junction, were obtained without intravenous contrast.  COMPARISON:  CT 04/27/2015.  MRI 04/15/2015.  MRI 04/14/2015.  FINDINGS: MRI HEAD FINDINGS  Diffusion imaging does not show any acute or subacute infarction. There are mild chronic small-vessel ischemic changes affecting the pons. No focal cerebellar insult. There is old infarction affecting both thalami I an the right  basal ganglia. There chronic small-vessel ischemic changes affecting the hemispheric deep white matter. No large vessel territory infarction. No mass lesion, hemorrhage, hydrocephalus or extra-axial collection. No pituitary mass. Mild mucosal inflammatory changes affect the paranasal sinuses. Major vessels at the base of the brain show flow.  MRI CERVICAL SPINE FINDINGS  The patient has had interval posterior decompression and fusion from C3 through C6. Lateral mass hardware appears grossly well positioned as evaluated by MRI. There is fluid within the operative approach in the region behind C3 through C5. This could be ordinary postoperative seroma, but CSF leak is not excluded. This fluid collection does not appear to visibly compress the spinal cord. Edema remains evident in the C4-5 disc space consistent with a hyperextension injury. There is no evidence of epidural hematoma postoperatively.  IMPRESSION: MRI head: No acute infarction. Old small vessel Ing infarctions most notable in the right basal ganglia and both thalami.  MRI cervical spine: Interval posterior decompression and posterior fusion from C3 through C6. Fluid collection along the operative approach behind C3, C4 and C5 measuring approximately 5.6 x 5.2 by 3.0 cm. This could be postoperative seroma or could indicate CSF leak. No evidence of epidural hematoma.   Electronically Signed   By: Paulina Fusi M.D.   On: 04/30/2015 16:17   Mr Brain Wo Contrast  04/14/2015   CLINICAL DATA:  Recent fall, worsening of RIGHT arm paresthesias and weakness. History of carotid artery occlusion, hypertension, diabetes, stroke.  EXAM: MRI HEAD WITHOUT CONTRAST  TECHNIQUE: Multiplanar, multiecho pulse sequences of the brain and surrounding structures were obtained without intravenous contrast.  COMPARISON:  None.  FINDINGS: Multiple sequences are mild to moderately motion degraded.  No reduced diffusion to suggest acute ischemia. No susceptibility artifact to  suggest hemorrhage. Moderate to severe ventriculomegaly with proportional enlargement of cerebral sulci and cerebellar folia. Old RIGHT basal ganglia and bilateral thalamus lacunar infarcts. No midline shift, mass effect or mass lesions. RIGHT cerebral peduncle volume loss compatible with wallerian degeneration. A few scattered subcentimeter supratentorial white matter T2 hyperintensities, and T2 hyperintense signal within the pons consistent with mild chronic small vessel ischemic disease. No midline shift, mass effect or mass lesions.  No abnormal extra-axial fluid collections. Normal major intracranial vascular flow voids seen at the skull base.  Status post bilateral ocular lens implants. Moderate paranasal sinus mucosal thickening and probably inspissated mucus RIGHT maxillary sinus. The mastoid air cells are well aerated. No abnormal sellar expansion. No cerebellar tonsillar ectopia. No suspicious calvarial bone  marrow signal. Patient is edentulous.  IMPRESSION: No acute intracranial process on this motion degraded examination.  Old RIGHT basal ganglia and bilateral thalamus infarcts.  Moderate to severe global brain atrophy. Mild chronic small vessel ischemic disease.   Electronically Signed   By: Awilda Metro M.D.   On: 04/14/2015 01:44   Mr Cervical Spine Wo Contrast  04/15/2015   CLINICAL DATA:  79 year old post trauma. Weakness. Subsequent encounter.  EXAM: MRI CERVICAL SPINE WITHOUT CONTRAST  TECHNIQUE: Multiplanar, multisequence MR imaging of the cervical spine was performed. No intravenous contrast was administered.  COMPARISON:  04/14/2015 CT and MR.  FINDINGS: Exam was attempted without sedation and revealed increase in the degree of retropharyngeal edema/ hemorrhage. Axial images remain motion degraded. Preliminary results discussed with Dr. Franky Macho and it was elected to perform examination with anesthesia/moderate sedation.  On repeat imaging with sedation, retropharyngeal edema/hemorrhage  is once again noted located anterior to the longus coli muscles extending from the clivus to the upper C5 level and maximal at the C2 through C4 level.  Injury through the C4-5 anterior longitudinal ligament, C4-5 disc space extending to involve the C4-5 facet joints and posterior paraspinal musculature consistent with extension injury. Buckling of the posterior ligaments which appear edematous. Short pedicles. Broad-based disc osteophyte complex. Facet joint bony overgrowth. Uncinate hypertrophy. Severe C4-5 spinal stenosis and cord compression greater on the left (cord flattening to 3 mm). Marked bilateral foraminal narrowing.  Spur posterior C5 vertebral body greater on left with severe cord compression greater on the left.  C5-6 broad-based disc osteophyte complex with slight cephalad extension. Moderate to marked left-sided and moderate right-sided spinal stenosis. Marked bilateral foraminal narrowing.  Edema within the cord extends from the C3 to the C6 level maximal at the C4 and C5 level. Gradient sequence without focal cord hemorrhage.  C2-3: Shallow broad-based disc osteophyte complex greater to right. Mild narrowing ventral aspect of the thecal sac without cord compression.  C3-4: No edema involving the C3-4 anterior osteophyte to suggest acute fracture as questioned on recent CT. Broad-based disc osteophyte complex greater to left. Moderate spinal stenosis greater on left. Facet joint degenerative changes. Marked left-sided and moderate right-sided foraminal narrowing.  Flowing osteophyte with fusion C6 through T2.  C6-7: Bulge and osteophyte with mild narrowing ventral aspect of the thecal sac greater on the left. Mild bilateral foraminal narrowing.  C7-T1: Uncinate hypertrophy. Mild to moderate foraminal narrowing bilaterally.  Small amount of fluid within the C1 occipital condyle articulation bilaterally and C1-C2 lateral mass joint bilaterally without surrounding bone edema or soft tissue edema  (i.e., separate from the retropharyngeal edema) to suggest significant osseous or ligamentous injury.  IMPRESSION: Interval increase in retropharyngeal edema/hemorrhage maximal at the C2 through C4 level.  Injury through the C4-5 anterior longitudinal ligament, C4-5 disc space extending to involve the C4-5 facet joints and posterior paraspinal musculature consistent with extension injury. Buckling of the posterior ligaments which appear edematous. Short pedicles. Broad-based disc osteophyte complex. Facet joint bony overgrowth. Uncinate hypertrophy. Severe C4-5 spinal stenosis and cord compression greater on the left (cord flattening to 3 mm). Marked bilateral foraminal narrowing.  Spur posterior C5 vertebral body greater on left with severe cord compression greater on the left.  C5-6 broad-based disc osteophyte complex with slight cephalad extension. Moderate to marked left-sided and moderate right-sided spinal stenosis. Marked bilateral foraminal narrowing.  Edema within the cord extends from the C3 to the C6 level maximal at the C4 and C5 level. Gradient sequence without focal cord  hemorrhage.  C2-3 shallow broad-based disc osteophyte complex greater to right. Mild narrowing ventral aspect of the thecal sac without cord compression.  C3-4 broad-based disc osteophyte complex greater to left. Moderate spinal stenosis greater on left. Marked left-sided and moderate right-sided foraminal narrowing. No edema involving the C3-4 anterior osteophyte to suggest acute fracture as questioned on recent CT.  Flowing osteophyte with fusion C6 through T2.  C6-7 bulge and osteophyte with mild narrowing ventral aspect of the thecal sac greater on the left. Mild bilateral foraminal narrowing.  C7-T1 mild to moderate foraminal narrowing bilaterally.  Small amount of fluid within the C1 occipital condyle articulation bilaterally and C1-C2 lateral mass joint bilaterally without surrounding bone edema or soft tissue edema (i.e.,  separate from the retropharyngeal edema) to suggest significant osseous or ligamentous injury.  These results were called by telephone at the time of interpretation on 04/15/2015 at 1:44 pm to Dr. Coletta Memos , who verbally acknowledged these results.   Electronically Signed   By: Lacy Duverney M.D.   On: 04/15/2015 14:24   Mr Cervical Spine Wo Contrast  04/14/2015   ADDENDUM REPORT: 04/14/2015 11:02 ADDENDUM: Original report by Dr. Karie Kirks. Addendum by Dr. Mosetta Putt: Abnormal T2 hyperintensity within the spinal cord is most consistent with edema, with myelomalacia a less likely consideration. Study discussed in person with Dr. Franky Macho on 04/14/2015 at 10 a.m. Electronically Signed   By: Sebastian Ache M.D.   On: 04/14/2015 11:02  04/14/2015   CLINICAL DATA:  Worsening RIGHT arm paresthesias after recent fall. History of carotid artery occlusion, hypertension, diabetes and stroke.  EXAM: MRI CERVICAL SPINE WITHOUT CONTRAST  TECHNIQUE: Multiplanar, multisequence MR imaging of the cervical spine was performed. No intravenous contrast was administered.  COMPARISON:  None.  FINDINGS: Motion degraded examination, per technologist note, patient had difficulty remaining still even with sedation. Axial T2 and T2 gradient sequences nearly nondiagnostic, severely degraded sagittal STIR.  Cervical vertebral bodies and posterior elements appear intact and aligned with maintenance of cervical lordosis. However, there is mild widening of the C4-5 disc space ventrally, associated with bright STIR signal concerning for fracture, likely throughout osteophyte. Severe C5-6 disc height loss. Bridging bone marrow signal C6-7 and T1-2 consistent with auto interbody arthrodesis. No malalignment. Maintenance of cervical lordosis. Moderate to severe chronic discogenic endplate changes C4-5, C5-6. Disc desiccation C2-3 through C5-6 and, C7-T1.  Bright T2 signal within the spinal cord from C3 - C5-6, most consistent with cord edema or pre  syrinx without frank syrinx. Craniocervical junction intact. Probable disruption of the anterior longitudinal ligament which is likely calcified at C4-5. Small apparent retropharyngeal versus prevertebral effusion, difficult to evaluate due to patient motion.  Severely limited axial gradient sequences. 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 Superimposed degenerative change resulting in severe canal stenosis C3-4 thru C5-6, corroborated by sagittal imaging, AP dimension the canal is approximately 6 mm. Probable neural foraminal narrowing midcervical spine, limited assessment.  IMPRESSION: Motion degraded examination, nearly nondiagnostic axial sequences.  Acute fracture through the C4-5 anterior disc space which is likely calcified, including suspected focal disruption of the anterior longitudinal ligament, concerning for hyperextension injury. Probable prevertebral versus retropharyngeal effusion.  Spinal cord edema/pre syrinx C3 through C5-6 without frank syrinx.  Degenerative change of the cervical spine superimposed on congenital canal stenosis. Severe canal stenosis C3-4 through C5-6. Probable neural foraminal narrowing.  Recommend CT cervical spine to assess bony changes.  Electronically Signed: By: Awilda Metro M.D. On: 04/14/2015 02:33  Mr Cervical Spine W Wo Contrast  04/30/2015   CLINICAL DATA:  Left sided weakness developed recently. Recent fracture at C3-4 treated with surgery on 04/15/2015.  EXAM: MRI HEAD WITHOUT CONTRAST  MRI CERVICAL SPINE WITHOUT CONTRAST  TECHNIQUE: Multiplanar, multiecho pulse sequences of the brain and surrounding structures, and cervical spine, to include the craniocervical junction and cervicothoracic junction, were obtained without intravenous contrast.  COMPARISON:  CT 04/27/2015.  MRI 04/15/2015.  MRI 04/14/2015.  FINDINGS: MRI HEAD FINDINGS  Diffusion imaging does not show any acute or subacute infarction. There are mild  chronic small-vessel ischemic changes affecting the pons. No focal cerebellar insult. There is old infarction affecting both thalami I an the right basal ganglia. There chronic small-vessel ischemic changes affecting the hemispheric deep white matter. No large vessel territory infarction. No mass lesion, hemorrhage, hydrocephalus or extra-axial collection. No pituitary mass. Mild mucosal inflammatory changes affect the paranasal sinuses. Major vessels at the base of the brain show flow.  MRI CERVICAL SPINE FINDINGS  The patient has had interval posterior decompression and fusion from C3 through C6. Lateral mass hardware appears grossly well positioned as evaluated by MRI. There is fluid within the operative approach in the region behind C3 through C5. This could be ordinary postoperative seroma, but CSF leak is not excluded. This fluid collection does not appear to visibly compress the spinal cord. Edema remains evident in the C4-5 disc space consistent with a hyperextension injury. There is no evidence of epidural hematoma postoperatively.  IMPRESSION: MRI head: No acute infarction. Old small vessel Ing infarctions most notable in the right basal ganglia and both thalami.  MRI cervical spine: Interval posterior decompression and posterior fusion from C3 through C6. Fluid collection along the operative approach behind C3, C4 and C5 measuring approximately 5.6 x 5.2 by 3.0 cm. This could be postoperative seroma or could indicate CSF leak. No evidence of epidural hematoma.   Electronically Signed   By: Paulina Fusi M.D.   On: 04/30/2015 16:17   Dg Chest Port 1 View  04/27/2015   CLINICAL DATA:  Altered mental status, shortness of breath  EXAM: PORTABLE CHEST 1 VIEW  COMPARISON:  04/17/2015  FINDINGS: There is mild bilateral chronic interstitial thickening. There is no focal parenchymal opacity. There is no pleural effusion or pneumothorax. The heart and mediastinal contours are unremarkable.  The osseous structures  are unremarkable.  IMPRESSION: No active disease.   Electronically Signed   By: Elige Ko   On: 04/27/2015 18:52   Dg Chest Port 1 View  04/17/2015   CLINICAL DATA:  Shortness of breath.  EXAM: PORTABLE CHEST - 1 VIEW  COMPARISON:  04/15/2015 .  FINDINGS: Interim extubation. Stable cardiomegaly. Persistent bibasilar atelectasis and/or interstitial infiltrates. Basilar interstitial edema cannot be excluded. Small left pleural effusion . No pneumothorax. No acute bony abnormality .  IMPRESSION: 1. Interim extubation.  2.  Persistent cardiomegaly.  3. Persistent bibasilar atelectasis and interstitial prominence. Bibasilar pneumonia and or interstitial edema cannot be excluded. Persistent small left pleural effusion .   Electronically Signed   By: Maisie Fus  Register   On: 04/17/2015 08:08   Portable Chest Xray  04/15/2015   CLINICAL DATA:  79 year old male status post intubation.  EXAM: PORTABLE CHEST - 1 VIEW  COMPARISON:  Radiograph dated 04/13/2015 and CT dated 04/13/2015.  FINDINGS: An endotracheal tube is seen with tip approximately 5.5 cm above the carina. There is a small left pleural effusion. Bibasilar atelectatic changes again noted. Superimposed pneumonia is not  excluded. There is no pneumothorax. Stable cardiac silhouette. The osseous structures appear grossly unremarkable. Right humeral head fixation pin.  IMPRESSION: Endotracheal tube above the carina.   Electronically Signed   By: Elgie Collard M.D.   On: 04/15/2015 23:22   Dg Shoulder Right Port  04/20/2015   CLINICAL DATA:  Recent shoulder pain  EXAM: PORTABLE RIGHT SHOULDER - 2+ VIEW  COMPARISON:  None.  FINDINGS: Degenerative changes of the acromioclavicular joint are seen. Postoperative changes in the humeral head are noted. No acute fracture or dislocation is noted. Postsurgical changes are noted in the cervical spine.  IMPRESSION: No acute abnormality noted.   Electronically Signed   By: Alcide Clever M.D.   On: 04/20/2015 18:11   Dg  Swallowing Func-speech Pathology  05/02/2015    Objective Swallowing Evaluation:    Patient Details  Name: Charles Bridges MRN: [id number] Date of Birth: 11/28/1935  Today's Date: 05/02/2015 Time: SLP Start Time (ACUTE ONLY): 1040-SLP Stop Time (ACUTE ONLY): 1100 SLP Time Calculation (min) (ACUTE ONLY): 20 min  Past Medical History:  Past Medical History  Diagnosis Date  . Carotid artery occlusion   . Hypertension   . Hyperlipidemia   . Arthritis   . Stroke [facility]) 1998 and  1999  . Ulcer     Hiatal Hernia / Gastric ulcers  . History of vagotomy   . Chronic diarrhea   . Fall against object Oct. 16, 2015    Pt got tangled up with dog chain out side in the woods.  . Diabetes mellitus 2006    TYPE 2   Past Surgical History:  Past Surgical History  Procedure Laterality Date  . Carotid endarterectomy  03/02/2000    Right CEA  . Fracture surgery      Right Shoulder  . Fracture right leg    . Radiology with anesthesia N/A 04/15/2015    Procedure: MRI CERVICAL SPINE WITHOUT CONTRAST    (RADIOLOGY WITH  ANESTHESIA);  Surgeon: Medication Radiologist, MD;  Location: [facility] OR;   Service: Radiology;  Laterality: N/A;  . Posterior cervical fusion/foraminotomy N/A 04/15/2015    Procedure: POSTERIOR CERVICAL FUSION/FORAMINOTOMY LEVEL 3;  Surgeon:  Coletta Memos, MD;  Location: [facility] OR;  Service: Neurosurgery;  Laterality:  N/A;  C3-6 posterior cervical arthrodesis with instrumentation  . Radiology with anesthesia Left 04/30/2015    Procedure: MRI BRAIN WITHOUT CONTRAST, MRI CERVICAL SPINE WITH AND  WITHOUT CONTRAST;  Surgeon: Medication Radiologist, MD;  Location: [facility] OR;   Service: Radiology;  Laterality: Left;  DR. Derwood Kaplan   HPI:  Other Pertinent Information: 79 year old white male sent from nursing home  with encephalopathy and left-sided weakness. Had a recent hospitalization  after a fall. At that time, was found to have C-spine is soft tissue  injury and fracture of anterior C3-4 osteophyte with stenosis noted and  cord edema. On 9/20,  had C3, 4, 5 and 6 laminectomies with posterior  arthrodesis of C3-6, morselized allograft by Dr. Mikal Plane. In the emergency  room, found to be in acute renal failure and have a urinary tract  infection. CT brain was negative for anything acute. PMH of CVA, alcohol  abuse. FEES complete 04/22/15 and recommended dysphagia 3 with nectar thick  liquids.   No Data Recorded  Assessment / Plan / Recommendation CHL IP CLINICAL IMPRESSIONS 05/02/2015  Therapy Diagnosis Moderate pharyngeal phase dysphagia  Clinical Impression Pt demonstrates a moderate oropharyngeal dysphagia  with decreased strength and mobility of the hyolaryngeal complex resulting  slightly  decreased laryngeal closure and intermittent trace penetration of  thin liquids and mild to moderate residuals. An intermittent throat clear  and second swallow clears vesitibule of minimal penetrate. No aspiration  observed. Recommend pt upgrade to regular texture diet and thin liqudis  with training for a second swallow and intermittent throat clear.       CHL IP TREATMENT RECOMMENDATION 05/02/2015  Treatment Recommendations Therapy as outlined in treatment plan below     CHL IP DIET RECOMMENDATION 05/02/2015  SLP Diet Recommendations Age appropriate regular solids;Thin  Liquid Administration via (None)  Medication Administration Whole meds with puree  Compensations Minimize environmental distractions;Slow rate;Small  sips/bites  Postural Changes and/or Swallow Maneuvers (None)     CHL IP OTHER RECOMMENDATIONS 05/02/2015  Recommended Consults (None)  Oral Care Recommendations Oral care BID  Other Recommendations (None)     CHL IP FOLLOW UP RECOMMENDATIONS 04/22/2015  Follow up Recommendations 24 hour supervision/assistance;Skilled Nursing  facility     CHL IP FREQUENCY AND DURATION 05/02/2015  Speech Therapy Frequency (ACUTE ONLY) min 2x/week  Treatment Duration 2 weeks     Pertinent Vitals/Pain NA    SLP Swallow Goals No flowsheet data found.  No flowsheet data found.     CHL IP REASON FOR REFERRAL 05/02/2015  Reason for Referral Objectively evaluate swallowing function     CHL IP ORAL PHASE 05/02/2015  Lips (None)  Tongue (None)  Mucous membranes (None)  Nutritional status (None)  Other (None)  Oxygen therapy (None)  Oral Phase Impaired  Oral - Pudding Teaspoon (None)  Oral - Pudding Cup (None)  Oral - Honey Teaspoon (None)  Oral - Honey Cup (None)  Oral - Honey Syringe (None)  Oral - Nectar Teaspoon (None)  Oral - Nectar Cup (None)  Oral - Nectar Straw (None)  Oral - Nectar Syringe (None)  Oral - Ice Chips (None)  Oral - Thin Teaspoon (None)  Oral - Thin Cup (None)  Oral - Thin Straw (None)  Oral - Thin Syringe (None)  Oral - Puree (None)  Oral - Mechanical Soft (None)  Oral - Regular (None)  Oral - Multi-consistency (None)  Oral - Pill (None)  Oral Phase - Comment (None)      CHL IP PHARYNGEAL PHASE 05/02/2015  Pharyngeal Phase Impaired  Pharyngeal - Pudding Teaspoon (None)  Penetration/Aspiration details (pudding teaspoon) (None)  Pharyngeal - Pudding Cup (None)  Penetration/Aspiration details (pudding cup) (None)  Pharyngeal - Honey Teaspoon (None)  Penetration/Aspiration details (honey teaspoon) (None)  Pharyngeal - Honey Cup (None)  Penetration/Aspiration details (honey cup) (None)  Pharyngeal - Honey Syringe (None)  Penetration/Aspiration details (honey syringe) (None)  Pharyngeal - Nectar Teaspoon (None)  Penetration/Aspiration details (nectar teaspoon) (None)  Pharyngeal - Nectar Cup (None)  Penetration/Aspiration details (nectar cup) (None)  Pharyngeal - Nectar Straw (None)  Penetration/Aspiration details (nectar straw) (None)  Pharyngeal - Nectar Syringe (None)  Penetration/Aspiration details (nectar syringe) (None)  Pharyngeal - Ice Chips (None)  Penetration/Aspiration details (ice chips) (None)  Pharyngeal - Thin Teaspoon (None)  Penetration/Aspiration details (thin teaspoon) (None)  Pharyngeal - Thin Cup (None)  Penetration/Aspiration details (thin cup) (None)  Pharyngeal  - Thin Straw (None)  Penetration/Aspiration details (thin straw) (None)  Pharyngeal - Thin Syringe (None)  Penetration/Aspiration details (thin syringe') (None)  Pharyngeal - Puree (None)  Penetration/Aspiration details (puree) (None)  Pharyngeal - Mechanical Soft (None)  Penetration/Aspiration details (mechanical soft) (None)  Pharyngeal - Regular (None)  Penetration/Aspiration details (regular) (None)  Pharyngeal - Multi-consistency (None)  Penetration/Aspiration details (multi-consistency) (  None)  Pharyngeal - Pill (None)  Penetration/Aspiration details (pill) (None)  Pharyngeal Comment (None)      CHL IP CERVICAL ESOPHAGEAL PHASE 04/22/2015  Cervical Esophageal Phase Impaired  Pudding Teaspoon (None)  Pudding Cup (None)  Honey Teaspoon (None)  Honey Cup (None)  Honey Straw (None)  Nectar Teaspoon (None)  Nectar Cup (None)  Nectar Straw (None)  Nectar Sippy Cup (None)  Thin Teaspoon (None)  Thin Cup (None)  Thin Straw Esophageal backflow into the pharynx;Esophageal backflow into  the larynx  Thin Sippy Cup (None)  Cervical Esophageal Comment from the interarytnoid space which was then  aspirated, sensation intact and cough cleared aspirates with multple  swallows clearing residual     No flowsheet data found.        Harlon Ditty, MA CCC-SLP [phone]  Dyanne Iha Riley Nearing 05/02/2015, 3:35 PM    Dg C-arm 1-60 Min-no Report  04/15/2015   CLINICAL DATA: for posterior cervical fusion,   C-ARM 1-60 MINUTES  Fluoroscopy was utilized by the requesting physician.  No radiographic  interpretation.     Microbiology: Recent Results (from the past 240 hour(s))  Urine culture     Status: None   Collection Time: 04/27/15  7:59 PM  Result Value Ref Range Status   Specimen Description URINE, CATHETERIZED  Final   Special Requests NONE  Final   Culture NO GROWTH 1 DAY  Final   Report Status 04/28/2015 FINAL  Final  Culture, blood (x 2)     Status: None   Collection Time: 04/27/15 10:00 PM  Result Value Ref  Range Status   Specimen Description BLOOD LEFT ARM  Final   Special Requests BOTTLES DRAWN AEROBIC AND ANAEROBIC 2CC  Final   Culture  Setup Time   Final    GRAM POSITIVE COCCI IN CLUSTERS IN BOTH AEROBIC AND ANAEROBIC BOTTLES CRITICAL RESULT CALLED TO, READ BACK BY AND VERIFIED WITH: Silvestre Moment RN [phone] 04/28/15 A BROWNING    Culture STAPHYLOCOCCUS SPECIES (COAGULASE NEGATIVE)  Final   Report Status 05/01/2015 FINAL  Final   Organism ID, Bacteria STAPHYLOCOCCUS SPECIES (COAGULASE NEGATIVE)  Final      Susceptibility   Staphylococcus species (coagulase negative) - MIC*    CIPROFLOXACIN >=8 RESISTANT Resistant     ERYTHROMYCIN >=8 RESISTANT Resistant     GENTAMICIN 8 INTERMEDIATE Intermediate     OXACILLIN >=4 RESISTANT Resistant     TETRACYCLINE 2 SENSITIVE Sensitive     VANCOMYCIN 2 SENSITIVE Sensitive     TRIMETH/SULFA 80 RESISTANT Resistant     CLINDAMYCIN >=8 RESISTANT Resistant     RIFAMPIN <=0.5 SENSITIVE Sensitive     Inducible Clindamycin NEGATIVE Sensitive     * STAPHYLOCOCCUS SPECIES (COAGULASE NEGATIVE)  Culture, blood (x 2)     Status: None   Collection Time: 04/27/15 10:15 PM  Result Value Ref Range Status   Specimen Description BLOOD RIGHT ARM  Final   Special Requests BOTTLES DRAWN AEROBIC AND ANAEROBIC 5CC  Final   Culture  Setup Time   Final    GRAM POSITIVE COCCI IN CLUSTERS BOTTLES DRAWN AEROBIC ONLY CRITICAL RESULT CALLED TO, READ BACK BY AND VERIFIED WITH: M CRAVEN @0154  04/29/15 MKELLY    Culture   Final    STAPHYLOCOCCUS SPECIES (COAGULASE NEGATIVE) SUSCEPTIBILITIES PERFORMED ON PREVIOUS CULTURE WITHIN THE LAST 5 DAYS.    Report Status 05/01/2015 FINAL  Final  Culture, blood (routine x 2)     Status: None (Preliminary result)   Collection Time: 04/30/15  6:55 PM  Result Value Ref Range Status   Specimen Description BLOOD LEFT HAND  Final   Special Requests BOTTLES DRAWN AEROBIC AND ANAEROBIC 10CC  Final   Culture NO GROWTH 4 DAYS  Final   Report Status  PENDING  Incomplete  Culture, blood (routine x 2)     Status: None (Preliminary result)   Collection Time: 04/30/15  7:03 PM  Result Value Ref Range Status   Specimen Description BLOOD LEFT ANTECUBITAL  Final   Special Requests BOTTLES DRAWN AEROBIC AND ANAEROBIC 10CC  Final   Culture NO GROWTH 4 DAYS  Final   Report Status PENDING  Incomplete     Labs: Basic Metabolic Panel:  Recent Labs Lab 04/29/15 0533 04/30/15 0715 05/01/15 0435 05/02/15 0314 05/04/15 1450  NA 139 142 145 138 136  K 3.5 4.1 4.3 4.6 3.8  CL 111 111 114* 108 106  CO2 22 20* 21* 20* 22  GLUCOSE 131* 117* 105* 113* 120*  BUN 43* 25* 20 16 11   CREATININE 1.05 0.83 0.91 0.84 0.85  CALCIUM 7.9* 8.5* 8.3* 7.9* 8.3*   CBC:  Recent Labs Lab 04/29/15 0533 04/30/15 0715 05/01/15 0435 05/02/15 0314  WBC 6.3 6.3 5.5 6.3  HGB 8.2* 9.0* 8.2* 10.4*  HCT 25.2* 27.3* 25.0* 31.1*  MCV 101.6* 99.6 101.2* 98.7  PLT 304 322 291 189     Signed:  Juancarlos Crescenzo  [facility] 05/05/2015, 9:22 AM

## 2015-05-05 NOTE — Clinical Social Work Note (Signed)
Per MD patient ready to DC back to [facility]. Auth received: [id number] Rug level= RVC. RN, patient/family, and facility notified of patient's DC. RN given number for report. DC packet on patient's chart. Ambulance transport requested for patient. CSW signing off at this time.   Roddie Mc MSW, [place], [place], [phone]

## 2015-05-05 NOTE — Progress Notes (Signed)
Speech Language Pathology Treatment: Dysphagia  Patient Details Name: Charles Bridges MRN: [id number] DOB: 14-Dec-1935 Today's Date: 05/05/2015 Time: 1191-4782 SLP Time Calculation (min) (ACUTE ONLY): 35 min  Assessment / Plan / Recommendation Clinical Impression  Pt seen for dysphagia treatment to reinforce throat clearing technique. Pt was easily distracted by pain and the environment today and was not consistent despite cueing. Pt displayed overt signs of aspiration repeated through session. Recommend return to nectar thick liquids until pt can be consistent with throat clear, likely at next venue of care with SLP treatment. Discussed with pt with unreliable understanding. SLP will f/u while pt still admitted.    HPI Other Pertinent Information: 79 year old white male sent from nursing home with encephalopathy and left-sided weakness. Had a recent hospitalization after a fall. At that time, was found to have C-spine is soft tissue injury and fracture of anterior C3-4 osteophyte with stenosis noted and cord edema. On 9/20, had C3, 4, 5 and 6 laminectomies with posterior arthrodesis of C3-6, morselized allograft by Dr. Mikal Plane. In the emergency room, found to be in acute renal failure and have a urinary tract infection. CT brain was negative for anything acute. PMH of CVA, alcohol abuse. FEES complete 04/22/15 and recommended dysphagia 3 with nectar thick liquids.    Pertinent Vitals Pain Assessment: Faces Faces Pain Scale: Hurts even more Pain Location: pt indicates mid back pain.  Pain Descriptors / Indicators: Aching Pain Intervention(s): Monitored during session;Repositioned  SLP Plan  Continue with current plan of care    Recommendations Diet recommendations: Dysphagia 3 (mechanical soft);Nectar-thick liquid Liquids provided via: Cup;Straw Medication Administration: Whole meds with puree Supervision: Full supervision/cueing for compensatory strategies;Staff to assist with self  feeding Compensations: Minimize environmental distractions;Slow rate;Small sips/bites Postural Changes and/or Swallow Maneuvers: Seated upright 90 degrees              Oral Care Recommendations: Oral care BID Follow up Recommendations: 24 hour supervision/assistance;Skilled Nursing facility Plan: Continue with current plan of care    GO    [facility], MA CCC-SLP [phone]  Claudine Mouton 05/05/2015, 9:36 AM

## 2015-05-20 ENCOUNTER — Ambulatory Visit (EMERGENCY_DEPARTMENT_HOSPITAL): Payer: Medicare Other

## 2015-05-20 ENCOUNTER — Encounter (HOSPITAL_COMMUNITY): Payer: Self-pay | Admitting: Emergency Medicine

## 2015-05-20 ENCOUNTER — Emergency Department (HOSPITAL_COMMUNITY)
Admission: EM | Admit: 2015-05-20 | Discharge: 2015-05-20 | Disposition: A | Discharge status: Home / Self Care | Payer: Medicare Other | Attending: Emergency Medicine | Admitting: Emergency Medicine

## 2015-05-20 DIAGNOSIS — Z79899 Other long term (current) drug therapy: Secondary | ICD-10-CM | POA: Diagnosis not present

## 2015-05-20 DIAGNOSIS — Z8673 Personal history of transient ischemic attack (TIA), and cerebral infarction without residual deficits: Secondary | ICD-10-CM | POA: Insufficient documentation

## 2015-05-20 DIAGNOSIS — Z87891 Personal history of nicotine dependence: Secondary | ICD-10-CM | POA: Diagnosis not present

## 2015-05-20 DIAGNOSIS — M1990 Unspecified osteoarthritis, unspecified site: Secondary | ICD-10-CM | POA: Diagnosis not present

## 2015-05-20 DIAGNOSIS — E119 Type 2 diabetes mellitus without complications: Secondary | ICD-10-CM | POA: Diagnosis not present

## 2015-05-20 DIAGNOSIS — M7989 Other specified soft tissue disorders: Secondary | ICD-10-CM

## 2015-05-20 DIAGNOSIS — Z8719 Personal history of other diseases of the digestive system: Secondary | ICD-10-CM | POA: Insufficient documentation

## 2015-05-20 DIAGNOSIS — Z7982 Long term (current) use of aspirin: Secondary | ICD-10-CM | POA: Insufficient documentation

## 2015-05-20 DIAGNOSIS — I82601 Acute embolism and thrombosis of unspecified veins of right upper extremity: Secondary | ICD-10-CM | POA: Insufficient documentation

## 2015-05-20 DIAGNOSIS — I10 Essential (primary) hypertension: Secondary | ICD-10-CM | POA: Diagnosis not present

## 2015-05-20 DIAGNOSIS — E785 Hyperlipidemia, unspecified: Secondary | ICD-10-CM | POA: Insufficient documentation

## 2015-05-20 DIAGNOSIS — I82621 Acute embolism and thrombosis of deep veins of right upper extremity: Secondary | ICD-10-CM

## 2015-05-20 LAB — CBC WITH DIFFERENTIAL/PLATELET
BASOS PCT: 0 %
Basophils Absolute: 0 10*3/uL (ref 0.0–0.1)
EOS PCT: 8 %
Eosinophils Absolute: 0.4 10*3/uL (ref 0.0–0.7)
HEMATOCRIT: 29.1 % — AB (ref 39.0–52.0)
Hemoglobin: 9.4 g/dL — ABNORMAL LOW (ref 13.0–17.0)
LYMPHS PCT: 13 %
Lymphs Abs: 0.7 10*3/uL (ref 0.7–4.0)
MCH: 31.1 pg (ref 26.0–34.0)
MCHC: 32.3 g/dL (ref 30.0–36.0)
MCV: 96.4 fL (ref 78.0–100.0)
MONO ABS: 0.6 10*3/uL (ref 0.1–1.0)
Monocytes Relative: 12 %
NEUTROS ABS: 3.5 10*3/uL (ref 1.7–7.7)
Neutrophils Relative %: 67 %
Platelets: 190 10*3/uL (ref 150–400)
RBC: 3.02 MIL/uL — ABNORMAL LOW (ref 4.22–5.81)
RDW: 14.2 % (ref 11.5–15.5)
WBC: 5.1 10*3/uL (ref 4.0–10.5)

## 2015-05-20 LAB — BASIC METABOLIC PANEL
ANION GAP: 10 (ref 5–15)
BUN: 11 mg/dL (ref 6–20)
CALCIUM: 8.9 mg/dL (ref 8.9–10.3)
CO2: 26 mmol/L (ref 22–32)
Chloride: 103 mmol/L (ref 101–111)
Creatinine, Ser: 0.99 mg/dL (ref 0.61–1.24)
GFR calc Af Amer: >60 mL/min (ref >60)
GLUCOSE: 114 mg/dL — AB (ref 65–99)
POTASSIUM: 3.5 mmol/L (ref 3.5–5.1)
SODIUM: 139 mmol/L (ref 135–145)

## 2015-05-20 MED ORDER — HYDROCODONE-ACETAMINOPHEN 5-325 MG PO TABS
1.0000 | ORAL_TABLET | Freq: Once | ORAL | Status: AC
  Administered 2015-05-20: 1 via ORAL
  Filled 2015-05-20: qty 1

## 2015-05-20 MED ORDER — SODIUM CHLORIDE 0.9 % IV SOLN
INTRAVENOUS | Status: DC
  Administered 2015-05-20: 125 mL/h via INTRAVENOUS

## 2015-05-20 MED ORDER — XARELTO VTE STARTER PACK 15 & 20 MG PO TBPK: 15.0000 mg | ORAL_TABLET | ORAL | Status: DC

## 2015-05-20 MED ORDER — RIVAROXABAN 15 MG PO TABS
15.0000 mg | ORAL_TABLET | Freq: Once | ORAL | Status: AC
  Administered 2015-05-20: 15 mg via ORAL
  Filled 2015-05-20: qty 1

## 2015-05-20 NOTE — ED Notes (Signed)
Order placed to have IV team come and remove PICC Line

## 2015-05-20 NOTE — ED Provider Notes (Signed)
CSN: [id number]     Arrival date & time 05/20/15  1316 History   First MD Initiated Contact with Patient 05/20/15 1322     Chief Complaint  Patient presents with  . Cellulitis     (Consider location/radiation/quality/duration/timing/severity/associated sxs/prior Treatment) HPI   Charles Bridges is a 79 y.o. male here for evaluation of right arm swelling with redness, induration unknown. Patient is a poor historian. He is currently living in an assisted living facility following hospital discharge, 2 weeks ago. He is receiving daily vancomycin for bacteremia, with an end date scheduled for 05/28/2015. There are no other known modifying factors. Family members are with the patient and were able to support the history.   Past Medical History  Diagnosis Date  . Carotid artery occlusion   . Hypertension   . Hyperlipidemia   . Arthritis   . Stroke [facility] 1998 and  1999  . Ulcer     Hiatal Hernia / Gastric ulcers  . History of vagotomy   . Chronic diarrhea   . Fall against object Oct. 16, 2015    Pt got tangled up with dog chain out side in the woods.  . Diabetes mellitus 2006    TYPE 2   Past Surgical History  Procedure Laterality Date  . Carotid endarterectomy  03/02/2000    Right CEA  . Fracture surgery      Right Shoulder  . Fracture right leg    . Radiology with anesthesia N/A 04/15/2015    Procedure: MRI CERVICAL SPINE WITHOUT CONTRAST    (RADIOLOGY WITH ANESTHESIA);  Surgeon: Medication Radiologist, MD;  Location: [facility] OR;  Service: Radiology;  Laterality: N/A;  . Posterior cervical fusion/foraminotomy N/A 04/15/2015    Procedure: POSTERIOR CERVICAL FUSION/FORAMINOTOMY LEVEL 3;  Surgeon: Coletta MemosKyle Cabbell, MD;  Location: [facility] OR;  Service: Neurosurgery;  Laterality: N/A;  C3-6 posterior cervical arthrodesis with instrumentation  . Radiology with anesthesia Left 04/30/2015    Procedure: MRI BRAIN WITHOUT CONTRAST, MRI CERVICAL SPINE WITH AND WITHOUT CONTRAST;  Surgeon: Medication  Radiologist, MD;  Location: [facility] OR;  Service: Radiology;  Laterality: Left;  DR. Derwood KaplanSULLIVAN/MRI   Family History  Problem Relation Age of Onset  . Cancer Mother     breast cancer  . Other Mother     Circulation problems and lower extremity amputation  . Other Father     cerebrovascular accident   Social History  Substance Use Topics  . Smoking status: Former Smoker    Types: Cigarettes    Quit date: 07/27/1999  . Smokeless tobacco: Never Used  . Alcohol Use: Yes     Comment: drinks 2 to 3 beers per night     Review of Systems  All other systems reviewed and are negative.     Allergies  Review of patient's allergies indicates no known allergies.  Home Medications   Prior to Admission medications   Medication Sig Start Date End Date Taking? Authorizing Provider  aspirin 81 MG chewable tablet Chew 1 tablet (81 mg total) by mouth daily. 04/22/15  Yes Shanker Levora DredgeM Ghimire, MD  folic acid (FOLVITE) 1 MG tablet Take 1 tablet (1 mg total) by mouth daily. 04/22/15  Yes Shanker Levora DredgeM Ghimire, MD  HYDROcodone-acetaminophen (NORCO/VICODIN) 5-325 MG tablet Take 1 tablet by mouth every 6 (six) hours as needed for severe pain. 05/05/15  Yes Costin Otelia SergeantM Gherghe, MD  latanoprost (XALATAN) 0.005 % ophthalmic solution Place 1 drop into the right eye at bedtime. 04/03/15  Yes Historical Provider, MD  lisinopril-hydrochlorothiazide (PRINZIDE,ZESTORETIC) 20-12.5 MG per tablet Take 1 tablet by mouth daily.   Yes Historical Provider, MD  lovastatin (MEVACOR) 40 MG tablet Take 40 mg by mouth daily.   Yes Historical Provider, MD  metoprolol (LOPRESSOR) 50 MG tablet Take 1 tablet (50 mg total) by mouth 2 (two) times daily. 04/22/15  Yes Shanker Levora Dredge, MD  Multiple Vitamin (MULTIVITAMIN WITH MINERALS) TABS tablet Take 1 tablet by mouth daily. 04/22/15  Yes Shanker Levora Dredge, MD  pantoprazole (PROTONIX) 40 MG tablet Take 1 tablet (40 mg total) by mouth daily. 04/22/15  Yes Shanker Levora Dredge, MD  senna (SENOKOT) 8.6 MG  TABS tablet Take 1 tablet (8.6 mg total) by mouth 2 (two) times daily. 04/22/15  Yes Shanker Levora Dredge, MD  thiamine 100 MG tablet Take 1 tablet (100 mg total) by mouth daily. 04/22/15  Yes Shanker Levora Dredge, MD  vancomycin (VANCOCIN) 1 GM/200ML SOLN Inject 200 mLs (1,000 mg total) into the vein every 12 (twelve) hours. End date 05/28/2015 05/05/15  Yes Costin Otelia Sergeant, MD  XARELTO STARTER PACK 15 & 20 MG TBPK Take 15-20 mg by mouth as directed. Take as directed on package: Start with one  tablet by mouth twice a day with food. On Day 22, switch to one  tablet once a day with food. 05/20/15   Mancel Bale, MD   BP 154/47 mmHg  Pulse 70  Temp(Src) 98.4 F (36.9 C) (Oral)  Resp 16  SpO2 98% Physical Exam  Constitutional: He is oriented to person, place, and time. He appears well-developed and well-nourished.  HENT:  Head: Normocephalic and atraumatic.  Right Ear: External ear normal.  Left Ear: External ear normal.  Eyes: Conjunctivae and EOM are normal. Pupils are equal, round, and reactive to light.  Neck: Normal range of motion and phonation normal. Neck supple.  Cardiovascular: Normal rate, regular rhythm and normal heart sounds.   Pulmonary/Chest: Effort normal and breath sounds normal. He exhibits no bony tenderness.  Abdominal: Soft. There is no tenderness.  Musculoskeletal: Normal range of motion.  Neurological: He is alert and oriented to person, place, and time. No cranial nerve deficit or sensory deficit. He exhibits normal muscle tone. Coordination normal.  Skin: Skin is warm, dry and intact.  Psychiatric: He has a normal mood and affect. His behavior is normal. Judgment and thought content normal.  Nursing note and vitals reviewed.   ED Course  Procedures (including critical care time)  Medications  HYDROcodone-acetaminophen (NORCO/VICODIN) 5-325 MG per tablet 1 tablet (1 tablet Oral Given 05/20/15 1659)  Rivaroxaban (XARELTO) tablet 15 mg (15 mg Oral Given  05/20/15 1803)    No data found.      Labs Review Labs Reviewed  BASIC METABOLIC PANEL - Abnormal; Notable for the following:    Glucose, Bld 114 (*)    All other components within normal limits  CBC WITH DIFFERENTIAL/PLATELET - Abnormal; Notable for the following:    RBC 3.02 (*)    Hemoglobin 9.4 (*)    HCT 29.1 (*)    All other components within normal limits    Imaging Review No results found. I have personally reviewed and evaluated these images and lab results as part of my medical decision-making.   EKG Interpretation None      MDM   Final diagnoses:  Arm DVT (deep venous thromboembolism), acute, right (HCC)    DVT, without evidence for PE, metabolic instability or serious bacterial infection.   Nursing Notes Reviewed/ Care Coordinated Applicable  Imaging Reviewed Interpretation of Laboratory Data incorporated into ED treatment  The patient appears reasonably screened and/or stabilized for discharge and I doubt any other medical condition or other [facility] requiring further screening, evaluation, or treatment in the ED at this time prior to discharge.  Plan: Home Medications- Xarelto; Home Treatments- rest, elevate right arm; return here if the recommended treatment, does not improve the symptoms; Recommended follow up- outpatient PICC placement, PCP for follow-up treatment of DVT, within one month.     Mancel Bale, MD 05/23/15 [phone]

## 2015-05-20 NOTE — ED Notes (Signed)
Pt sent from [facility] for redness swelling and warmth to right arm for bicep area to forearm. PT has PICC line in place being treated for staph infection receiving VANCOMYCIN every 12 hrs. Pt reports pain 9/10 in right arm. Pt has red streaks in right upper arm. Edema and warm to touch. Radial pulses intact +2 equal bilaterally.

## 2015-05-20 NOTE — Discharge Instructions (Signed)
Elevate the right arm above your heart, as much as possible. Call the interventional radiologist, office, to schedule appointment for placement of a new PICC line. You see peripheral line that was placed in the left arm for your antibiotic treatments.    Deep Vein Thrombosis A deep vein thrombosis (DVT) is a blood clot (thrombus) that usually occurs in a deep, larger vein of the lower leg or the pelvis, or in an upper extremity such as the arm. These are dangerous and can lead to serious and even life-threatening complications if the clot travels to the lungs. A DVT can damage the valves in your leg veins so that instead of flowing upward, the blood pools in the lower leg. This is called post-thrombotic syndrome, and it can result in pain, swelling, discoloration, and sores on the leg. CAUSES A DVT is caused by the formation of a blood clot in your leg, pelvis, or arm. Usually, several things contribute to the formation of blood clots. A clot may develop when:  Your blood flow slows down.  Your vein becomes damaged in some way.  You have a condition that makes your blood clot more easily. RISK FACTORS A DVT is more likely to develop in:  People who are older, especially over 77 years of age.  People who are overweight (obese).  People who sit or lie still for a long time, such as during long-distance travel (over 4 hours), bed rest, hospitalization, or during recovery from certain medical conditions like a stroke.  People who do not engage in much physical activity (sedentary lifestyle).  People who have chronic breathing disorders.  People who have a personal or family history of blood clots or blood clotting disease.  People who have peripheral vascular disease (PVD), diabetes, or some types of cancer.  People who have heart disease, especially if the person had a recent heart attack or has congestive heart failure.  People who have neurological diseases that affect the legs  (leg paresis).  People who have had a traumatic injury, such as breaking a hip or leg.  People who have recently had major or lengthy surgery, especially on the hip, knee, or abdomen.  People who have had a central line placed inside a large vein.  People who take medicines that contain the hormone estrogen. These include birth control pills and hormone replacement therapy.  Pregnancy or during childbirth or the postpartum period.  Long plane flights (over 8 hours). SIGNS AND SYMPTOMS Symptoms of a DVT can include:   Swelling of your leg or arm, especially if one side is much worse.  Warmth and redness of your leg or arm, especially if one side is much worse.  Pain in your arm or leg. If the clot is in your leg, symptoms may be more noticeable or worse when you stand or walk.  A feeling of pins and needles, if the clot is in the arm. The symptoms of a DVT that has traveled to the lungs (pulmonary embolism, PE) usually start suddenly and include:  Shortness of breath while active or at rest.  Coughing or coughing up blood or blood-tinged mucus.  Chest pain that is often worse with deep breaths.  Rapid or irregular heartbeat.  Feeling light-headed or dizzy.  Fainting.  Feeling anxious.  Sweating. There may also be pain and swelling in a leg if that is where the blood clot started. These symptoms may represent a serious problem that is an emergency. Do not wait to see if  the symptoms will go away. Get medical help right away. Call your local emergency services (911 in the [place] Do not drive yourself to the hospital. DIAGNOSIS Your health care provider will take a medical history and perform a physical exam. You may also have other tests, including:  Blood tests to assess the clotting properties of your blood.  Imaging tests, such as CT, ultrasound, MRI, X-ray, and other tests to see if you have clots anywhere in your body. TREATMENT After a DVT is identified, it can be  treated. The type of treatment that you receive depends on many factors, such as the cause of your DVT, your risk for bleeding or developing more clots, and other medical conditions that you have. Sometimes, a combination of treatments is necessary. Treatment options may be combined and include:  Monitoring the blood clot with ultrasound.  Taking medicines by mouth, such as newer blood thinners (anticoagulants), thrombolytics, or warfarin.  Taking anticoagulant medicine by injection or through an IV tube.  Wearing compression stockings or using different types ofdevices.  Surgery (rare) to remove the blood clot or to place a filter in your abdomen to stop the blood clot from traveling to your lungs. Treatments for a DVT are often divided into immediate treatment and long-term treatment (up to 3 months after DVT). You can work with your health care provider to choose the treatment program that is best for you. HOME CARE INSTRUCTIONS If you are taking a newer oral anticoagulant:  Take the medicine every single day at the same time each day.  Understand what foods and drugs interact with this medicine.  Understand that there are no regular blood tests required when using this medicine.  Understand the side effects of this medicine, including excessive bruising or bleeding. Ask your health care provider or pharmacist about other possible side effects. If you are taking warfarin:  Understand how to take warfarin and know which foods can affect how warfarin works in Public relations account executive.  Understand that it is dangerous to take too much or too little warfarin. Too much warfarin increases the risk of bleeding. Too little warfarin continues to allow the risk for blood clots.  Follow your PT and INR blood testing schedule. The PT and INR results allow your health care provider to adjust your dose of warfarin. It is very important that you have your PT and INR tested as often as told by your health care  provider.  Avoid major changes in your diet, or tell your health care provider before you change your diet. Arrange a visit with a registered dietitian to answer your questions. Many foods, especially foods that are high in vitamin K, can interfere with warfarin and affect the PT and INR results. Eat a consistent amount of foods that are high in vitamin K, such as:  Spinach, kale, broccoli, cabbage, collard greens, turnip greens, Brussels sprouts, peas, cauliflower, seaweed, and parsley.  Beef liver and pork liver.  Green tea.  Soybean oil.  Tell your health care provider about any and all medicines, vitamins, and supplements that you take, including aspirin and other over-the-counter anti-inflammatory medicines. Be especially cautious with aspirin and anti-inflammatory medicines. Do not take those before you ask your health care provider if it is safe to do so. This is important because many medicines can interfere with warfarin and affect the PT and INR results.  Do not start or stop taking any over-the-counter or prescription medicine unless your health care provider or pharmacist tells you to  do so. If you take warfarin, you will also need to do these things:  Hold pressure over cuts for longer than usual.  Tell your dentist and other health care providers that you are taking warfarin before you have any procedures in which bleeding may occur.  Avoid alcohol or drink very small amounts. Tell your health care provider if you change your alcohol intake.  Do not use tobacco products, including cigarettes, chewing tobacco, and e-cigarettes. If you need help quitting, ask your health care provider.  Avoid contact sports. General Instructions  Take over-the-counter and prescription medicines only as told by your health care provider. Anticoagulant medicines can have side effects, including easy bruising and difficulty stopping bleeding. If you are prescribed an anticoagulant, you will also  need to do these things:  Hold pressure over cuts for longer than usual.  Tell your dentist and other health care providers that you are taking anticoagulants before you have any procedures in which bleeding may occur.  Avoid contact sports.  Wear a medical alert bracelet or carry a medical alert card that says you have had a PE.  Ask your health care provider how soon you can go back to your normal activities. Stay active to prevent new blood clots from forming.  Make sure to exercise while traveling or when you have been sitting or standing for a long period of time. It is very important to exercise. Exercise your legs by walking or by tightening and relaxing your leg muscles often. Take frequent walks.  Wear compression stockings as told by your health care provider to help prevent more blood clots from forming.  Do not use tobacco products, including cigarettes, chewing tobacco, and e-cigarettes. If you need help quitting, ask your health care provider.  Keep all follow-up appointments with your health care provider. This is important. PREVENTION Take these actions to decrease your risk of developing another DVT:  Exercise regularly. For at least 30 minutes every day, engage in:  Activity that involves moving your arms and legs.  Activity that encourages good blood flow through your body by increasing your heart rate.  Exercise your arms and legs every hour during long-distance travel (over 4 hours). Drink plenty of water and avoid drinking alcohol while traveling.  Avoid sitting or lying in bed for long periods of time without moving your legs.  Maintain a weight that is appropriate for your height. Ask your health care provider what weight is healthy for you.  If you are a woman who is over 1 years of age, avoid unnecessary use of medicines that contain estrogen. These include birth control pills.  Do not smoke, especially if you take estrogen medicines. If you need help  quitting, ask your health care provider. If you are hospitalized, prevention measures may include:  Early walking after surgery, as soon as your health care provider says that it is safe.  Receiving anticoagulants to prevent blood clots.If you cannot take anticoagulants, other options may be available, such as wearing compression stockings or using different types of devices. SEEK IMMEDIATE MEDICAL CARE IF:  You have new or increased pain, swelling, or redness in an arm or leg.  You have numbness or tingling in an arm or leg.  You have shortness of breath while active or at rest.  You have chest pain.  You have a rapid or irregular heartbeat.  You feel light-headed or dizzy.  You cough up blood.  You notice blood in your vomit, bowel movement, or urine.  These symptoms may represent a serious problem that is an emergency. Do not wait to see if the symptoms will go away. Get medical help right away. Call your local emergency services (911 in the [place] Do not drive yourself to the hospital.   This information is not intended to replace advice given to you by your health care provider. Make sure you discuss any questions you have with your health care provider.   Document Released: 07/12/2005 Document Revised: 04/02/2015 Document Reviewed: 11/06/2014 Elsevier Interactive Patient Education [employer]

## 2015-05-20 NOTE — Progress Notes (Signed)
VASCULAR LAB PRELIMINARY  PRELIMINARY  PRELIMINARY  PRELIMINARY  Right upper extremity venous duplex completed.    Preliminary report:  Right:  Acute, non occlusive DVT noted in the subclavian vein and axillary vein.  Acute thrombus noted in the basilic vein in the upper arm and in the cephalic vein in the forearm.    There is an hypoechoic structure in the mid right forearm adjacent to the radial artery measuring 1.3 x 0.8 cm.  Sharlize Hoar, RVT 05/20/2015, 4:21 PM

## 2015-05-20 NOTE — ED Notes (Signed)
Per Ulyess MortJ Easley, RN - IV to remain intact to left arm.

## 2015-05-20 NOTE — ED Notes (Signed)
IV team at bedside to remove PICC line. 

## 2015-05-20 NOTE — ED Notes (Signed)
Sp[facility] with Lawson [facility]ori [facility]. She is aware the IV team has removed Picc line in right arm due to a blood clot. Pt will be started on Xarelto. A appointment will need to be made for pt to have PICC line replaced. Because IR is now closed they will need to be called in the morning to have appointment scheduled. Lori LPN states her manager is okay to make that appointment in the morning and provide pt with transportation back here to have PICC line replaced within the next 3 days. Lori LPN is aware that pt has 20G IV in left upper arm placed by ultrasound that can be used for IV antibiotics until PICC line is replaced.

## 2015-05-21 ENCOUNTER — Other Ambulatory Visit (HOSPITAL_COMMUNITY): Payer: Self-pay | Admitting: Internal Medicine

## 2015-05-21 DIAGNOSIS — B958 Unspecified staphylococcus as the cause of diseases classified elsewhere: Secondary | ICD-10-CM

## 2015-05-23 ENCOUNTER — Ambulatory Visit (HOSPITAL_COMMUNITY)
Admission: RE | Admit: 2015-05-23 | Discharge: 2015-05-23 | Disposition: A | Discharge status: Home / Self Care | Payer: Medicare Other | Source: Ambulatory Visit | Attending: Internal Medicine | Admitting: Internal Medicine

## 2015-05-23 ENCOUNTER — Other Ambulatory Visit (HOSPITAL_COMMUNITY): Payer: Self-pay | Admitting: Internal Medicine

## 2015-05-23 DIAGNOSIS — R7881 Bacteremia: Secondary | ICD-10-CM | POA: Insufficient documentation

## 2015-05-23 DIAGNOSIS — B958 Unspecified staphylococcus as the cause of diseases classified elsewhere: Secondary | ICD-10-CM

## 2015-05-23 MED ORDER — LIDOCAINE HCL 1 % IJ SOLN
INTRAMUSCULAR | Status: AC
  Filled 2015-05-23: qty 20

## 2015-05-23 MED ORDER — HEPARIN SOD (PORK) LOCK FLUSH 100 UNIT/ML IV SOLN
INTRAVENOUS | Status: AC
  Filled 2015-05-23: qty 5

## 2015-05-23 NOTE — Procedures (Signed)
Successful placement of single lumen PICC line to left brachial vein. Length 44cm Tip at lower SVC/RA No complications Ready for use.  Pattricia BossMORGAN, Broadus Costilla D PA-C 10:31 am

## 2015-06-03 ENCOUNTER — Encounter (HOSPITAL_COMMUNITY): Payer: Self-pay

## 2015-06-03 ENCOUNTER — Ambulatory Visit: Payer: Medicare Other | Admitting: Family

## 2015-06-03 ENCOUNTER — Ambulatory Visit (INDEPENDENT_AMBULATORY_CARE_PROVIDER_SITE_OTHER): Payer: Medicare Other | Admitting: Internal Medicine

## 2015-06-03 ENCOUNTER — Encounter: Payer: Self-pay | Admitting: Internal Medicine

## 2015-06-03 VITALS — BP 153/49 | HR 70 | Temp 99.1°F

## 2015-06-03 DIAGNOSIS — R7881 Bacteremia: Secondary | ICD-10-CM

## 2015-06-03 LAB — CBC WITH DIFFERENTIAL/PLATELET
BASOS ABS: 0 10*3/uL (ref 0.0–0.1)
BASOS PCT: 0 % (ref 0–1)
EOS ABS: 0.4 10*3/uL (ref 0.0–0.7)
EOS PCT: 6 % — AB (ref 0–5)
HEMATOCRIT: 25.9 % — AB (ref 39.0–52.0)
Hemoglobin: 8.4 g/dL — ABNORMAL LOW (ref 13.0–17.0)
LYMPHS PCT: 8 % — AB (ref 12–46)
Lymphs Abs: 0.5 10*3/uL — ABNORMAL LOW (ref 0.7–4.0)
MCH: 30.4 pg (ref 26.0–34.0)
MCHC: 32.4 g/dL (ref 30.0–36.0)
MCV: 93.8 fL (ref 78.0–100.0)
MONO ABS: 0.5 10*3/uL (ref 0.1–1.0)
MPV: 9.4 fL (ref 8.6–12.4)
Monocytes Relative: 8 % (ref 3–12)
Neutro Abs: 5.1 10*3/uL (ref 1.7–7.7)
Neutrophils Relative %: 78 % — ABNORMAL HIGH (ref 43–77)
PLATELETS: 327 10*3/uL (ref 150–400)
RBC: 2.76 MIL/uL — AB (ref 4.22–5.81)
RDW: 15.4 % (ref 11.5–15.5)
WBC: 6.6 10*3/uL (ref 4.0–10.5)

## 2015-06-04 ENCOUNTER — Inpatient Hospital Stay (HOSPITAL_COMMUNITY)
Admission: EM | Admit: 2015-06-04 | Discharge: 2015-06-10 | DRG: 189 | Disposition: A | Discharge status: Skilled Nursing Facility | Payer: Medicare Other | Attending: Internal Medicine | Admitting: Internal Medicine

## 2015-06-04 ENCOUNTER — Emergency Department (HOSPITAL_COMMUNITY): Payer: Medicare Other

## 2015-06-04 ENCOUNTER — Encounter (HOSPITAL_COMMUNITY): Payer: Self-pay | Admitting: Emergency Medicine

## 2015-06-04 DIAGNOSIS — Z7982 Long term (current) use of aspirin: Secondary | ICD-10-CM

## 2015-06-04 DIAGNOSIS — Z6828 Body mass index (BMI) 28.0-28.9, adult: Secondary | ICD-10-CM

## 2015-06-04 DIAGNOSIS — R7881 Bacteremia: Secondary | ICD-10-CM | POA: Diagnosis not present

## 2015-06-04 DIAGNOSIS — D6832 Hemorrhagic disorder due to extrinsic circulating anticoagulants: Secondary | ICD-10-CM | POA: Diagnosis present

## 2015-06-04 DIAGNOSIS — J9600 Acute respiratory failure, unspecified whether with hypoxia or hypercapnia: Secondary | ICD-10-CM | POA: Diagnosis present

## 2015-06-04 DIAGNOSIS — I4891 Unspecified atrial fibrillation: Secondary | ICD-10-CM | POA: Diagnosis present

## 2015-06-04 DIAGNOSIS — W1789XS Other fall from one level to another, sequela: Secondary | ICD-10-CM

## 2015-06-04 DIAGNOSIS — R0602 Shortness of breath: Secondary | ICD-10-CM

## 2015-06-04 DIAGNOSIS — Z87891 Personal history of nicotine dependence: Secondary | ICD-10-CM

## 2015-06-04 DIAGNOSIS — F0390 Unspecified dementia without behavioral disturbance: Secondary | ICD-10-CM | POA: Diagnosis present

## 2015-06-04 DIAGNOSIS — J42 Unspecified chronic bronchitis: Secondary | ICD-10-CM

## 2015-06-04 DIAGNOSIS — J9601 Acute respiratory failure with hypoxia: Secondary | ICD-10-CM

## 2015-06-04 DIAGNOSIS — J90 Pleural effusion, not elsewhere classified: Secondary | ICD-10-CM | POA: Diagnosis present

## 2015-06-04 DIAGNOSIS — G9340 Encephalopathy, unspecified: Secondary | ICD-10-CM | POA: Diagnosis present

## 2015-06-04 DIAGNOSIS — I110 Hypertensive heart disease with heart failure: Secondary | ICD-10-CM | POA: Diagnosis present

## 2015-06-04 DIAGNOSIS — O2230 Deep phlebothrombosis in pregnancy, unspecified trimester: Secondary | ICD-10-CM

## 2015-06-04 DIAGNOSIS — R042 Hemoptysis: Secondary | ICD-10-CM | POA: Diagnosis present

## 2015-06-04 DIAGNOSIS — Z803 Family history of malignant neoplasm of breast: Secondary | ICD-10-CM

## 2015-06-04 DIAGNOSIS — R0902 Hypoxemia: Secondary | ICD-10-CM | POA: Diagnosis not present

## 2015-06-04 DIAGNOSIS — R627 Adult failure to thrive: Secondary | ICD-10-CM | POA: Diagnosis present

## 2015-06-04 DIAGNOSIS — Z8673 Personal history of transient ischemic attack (TIA), and cerebral infarction without residual deficits: Secondary | ICD-10-CM

## 2015-06-04 DIAGNOSIS — Z8619 Personal history of other infectious and parasitic diseases: Secondary | ICD-10-CM | POA: Diagnosis not present

## 2015-06-04 DIAGNOSIS — T45515A Adverse effect of anticoagulants, initial encounter: Secondary | ICD-10-CM | POA: Diagnosis present

## 2015-06-04 DIAGNOSIS — L89612 Pressure ulcer of right heel, stage 2: Secondary | ICD-10-CM | POA: Diagnosis present

## 2015-06-04 DIAGNOSIS — J9811 Atelectasis: Secondary | ICD-10-CM | POA: Diagnosis present

## 2015-06-04 DIAGNOSIS — I5031 Acute diastolic (congestive) heart failure: Secondary | ICD-10-CM | POA: Diagnosis present

## 2015-06-04 DIAGNOSIS — Z9981 Dependence on supplemental oxygen: Secondary | ICD-10-CM

## 2015-06-04 DIAGNOSIS — S14123S Central cord syndrome at C3 level of cervical spinal cord, sequela: Secondary | ICD-10-CM

## 2015-06-04 DIAGNOSIS — Z823 Family history of stroke: Secondary | ICD-10-CM

## 2015-06-04 DIAGNOSIS — N390 Urinary tract infection, site not specified: Secondary | ICD-10-CM | POA: Diagnosis present

## 2015-06-04 DIAGNOSIS — E119 Type 2 diabetes mellitus without complications: Secondary | ICD-10-CM | POA: Diagnosis present

## 2015-06-04 DIAGNOSIS — S14109A Unspecified injury at unspecified level of cervical spinal cord, initial encounter: Secondary | ICD-10-CM | POA: Diagnosis present

## 2015-06-04 DIAGNOSIS — J9690 Respiratory failure, unspecified, unspecified whether with hypoxia or hypercapnia: Secondary | ICD-10-CM

## 2015-06-04 DIAGNOSIS — Z8711 Personal history of peptic ulcer disease: Secondary | ICD-10-CM

## 2015-06-04 DIAGNOSIS — J449 Chronic obstructive pulmonary disease, unspecified: Secondary | ICD-10-CM | POA: Diagnosis present

## 2015-06-04 DIAGNOSIS — E785 Hyperlipidemia, unspecified: Secondary | ICD-10-CM | POA: Diagnosis present

## 2015-06-04 DIAGNOSIS — R0489 Hemorrhage from other sites in respiratory passages: Secondary | ICD-10-CM

## 2015-06-04 DIAGNOSIS — D649 Anemia, unspecified: Secondary | ICD-10-CM | POA: Diagnosis present

## 2015-06-04 DIAGNOSIS — H9190 Unspecified hearing loss, unspecified ear: Secondary | ICD-10-CM | POA: Diagnosis present

## 2015-06-04 DIAGNOSIS — D638 Anemia in other chronic diseases classified elsewhere: Secondary | ICD-10-CM | POA: Diagnosis present

## 2015-06-04 DIAGNOSIS — R531 Weakness: Secondary | ICD-10-CM | POA: Diagnosis present

## 2015-06-04 DIAGNOSIS — Z7901 Long term (current) use of anticoagulants: Secondary | ICD-10-CM

## 2015-06-04 DIAGNOSIS — Z86718 Personal history of other venous thrombosis and embolism: Secondary | ICD-10-CM

## 2015-06-04 DIAGNOSIS — L89621 Pressure ulcer of left heel, stage 1: Secondary | ICD-10-CM | POA: Diagnosis present

## 2015-06-04 DIAGNOSIS — I82409 Acute embolism and thrombosis of unspecified deep veins of unspecified lower extremity: Secondary | ICD-10-CM

## 2015-06-04 DIAGNOSIS — S14123A Central cord syndrome at C3 level of cervical spinal cord, initial encounter: Secondary | ICD-10-CM | POA: Diagnosis present

## 2015-06-04 DIAGNOSIS — Z794 Long term (current) use of insulin: Secondary | ICD-10-CM

## 2015-06-04 HISTORY — DX: Hemoptysis: R04.2

## 2015-06-04 LAB — URINALYSIS, ROUTINE W REFLEX MICROSCOPIC
BILIRUBIN URINE: NEGATIVE
GLUCOSE, UA: NEGATIVE mg/dL
KETONES UR: NEGATIVE mg/dL
NITRITE: NEGATIVE
PH: 5 (ref 5.0–8.0)
PROTEIN: NEGATIVE mg/dL
Specific Gravity, Urine: 1.021 (ref 1.005–1.030)
Urobilinogen, UA: 0.2 mg/dL (ref 0.0–1.0)

## 2015-06-04 LAB — BRAIN NATRIURETIC PEPTIDE: B NATRIURETIC PEPTIDE 5: 446.7 pg/mL — AB (ref 0.0–100.0)

## 2015-06-04 LAB — COMPREHENSIVE METABOLIC PANEL
ALT: 17 U/L (ref 17–63)
AST: 29 U/L (ref 15–41)
Albumin: 2.6 g/dL — ABNORMAL LOW (ref 3.5–5.0)
Alkaline Phosphatase: 62 U/L (ref 38–126)
Anion gap: 12 (ref 5–15)
BUN: 25 mg/dL — ABNORMAL HIGH (ref 6–20)
CO2: 24 mmol/L (ref 22–32)
Calcium: 8.7 mg/dL — ABNORMAL LOW (ref 8.9–10.3)
Chloride: 106 mmol/L (ref 101–111)
Creatinine, Ser: 1.12 mg/dL (ref 0.61–1.24)
GFR calc Af Amer: >60 mL/min (ref >60)
GFR calc non Af Amer: >60 mL/min (ref >60)
Glucose, Bld: 122 mg/dL — ABNORMAL HIGH (ref 65–99)
Potassium: 3.4 mmol/L — ABNORMAL LOW (ref 3.5–5.1)
Sodium: 142 mmol/L (ref 135–145)
Total Bilirubin: 0.4 mg/dL (ref 0.3–1.2)
Total Protein: 5.9 g/dL — ABNORMAL LOW (ref 6.5–8.1)

## 2015-06-04 LAB — PROTIME-INR
INR: 3.26 — ABNORMAL HIGH (ref 0.00–1.49)
PROTHROMBIN TIME: 32.6 s — AB (ref 11.6–15.2)

## 2015-06-04 LAB — URINE MICROSCOPIC-ADD ON

## 2015-06-04 LAB — SEDIMENTATION RATE: Sed Rate: 91 mm/hr — ABNORMAL HIGH (ref 0–20)

## 2015-06-04 LAB — CBC WITH DIFFERENTIAL/PLATELET
BASOS ABS: 0 10*3/uL (ref 0.0–0.1)
Basophils Relative: 0 %
EOS ABS: 0.3 10*3/uL (ref 0.0–0.7)
EOS PCT: 4 %
HCT: 28.5 % — ABNORMAL LOW (ref 39.0–52.0)
Hemoglobin: 8.6 g/dL — ABNORMAL LOW (ref 13.0–17.0)
Lymphocytes Relative: 8 %
Lymphs Abs: 0.6 10*3/uL — ABNORMAL LOW (ref 0.7–4.0)
MCH: 29.2 pg (ref 26.0–34.0)
MCHC: 30.2 g/dL (ref 30.0–36.0)
MCV: 96.6 fL (ref 78.0–100.0)
MONO ABS: 0.5 10*3/uL (ref 0.1–1.0)
Monocytes Relative: 7 %
Neutro Abs: 6.3 10*3/uL (ref 1.7–7.7)
Neutrophils Relative %: 81 %
PLATELETS: 356 10*3/uL (ref 150–400)
RBC: 2.95 MIL/uL — AB (ref 4.22–5.81)
RDW: 15.6 % — AB (ref 11.5–15.5)
WBC: 7.8 10*3/uL (ref 4.0–10.5)

## 2015-06-04 LAB — COMPLETE METABOLIC PANEL WITH GFR
ALT: 13 U/L (ref 9–46)
AST: 18 U/L (ref 10–35)
Albumin: 2.8 g/dL — ABNORMAL LOW (ref 3.6–5.1)
Alkaline Phosphatase: 66 U/L (ref 40–115)
BUN: 26 mg/dL — ABNORMAL HIGH (ref 7–25)
CO2: 25 mmol/L (ref 20–31)
Calcium: 8.1 mg/dL — ABNORMAL LOW (ref 8.6–10.3)
Chloride: 104 mmol/L (ref 98–110)
Creat: 0.97 mg/dL (ref 0.70–1.18)
GFR, Est African American: 87 mL/min (ref >=60)
GFR, Est Non African American: 75 mL/min (ref >=60)
Glucose, Bld: 119 mg/dL — ABNORMAL HIGH (ref 65–99)
Potassium: 3.5 mmol/L (ref 3.5–5.3)
Sodium: 142 mmol/L (ref 135–146)
Total Bilirubin: 0.4 mg/dL (ref 0.2–1.2)
Total Protein: 5.4 g/dL — ABNORMAL LOW (ref 6.1–8.1)

## 2015-06-04 LAB — TYPE AND SCREEN
ABO/RH(D): A POS
Antibody Screen: NEGATIVE

## 2015-06-04 LAB — I-STAT CG4 LACTIC ACID, ED: LACTIC ACID, VENOUS: 1.15 mmol/L (ref 0.5–2.0)

## 2015-06-04 LAB — TROPONIN I

## 2015-06-04 LAB — GLUCOSE, CAPILLARY
GLUCOSE-CAPILLARY: 101 mg/dL — AB (ref 65–99)
GLUCOSE-CAPILLARY: 98 mg/dL (ref 65–99)
GLUCOSE-CAPILLARY: 89 mg/dL (ref 65–99)

## 2015-06-04 MED ORDER — LATANOPROST 0.005 % OP SOLN
1.0000 [drp] | Freq: Every day | OPHTHALMIC | Status: DC
  Administered 2015-06-05 – 2015-06-09 (×6): 1 [drp] via OPHTHALMIC
  Filled 2015-06-04 (×2): qty 2.5

## 2015-06-04 MED ORDER — INSULIN ASPART 100 UNIT/ML ~~LOC~~ SOLN: 0.0000 [IU] | Freq: Every day | SUBCUTANEOUS | Status: DC

## 2015-06-04 MED ORDER — VITAMIN B-1 100 MG PO TABS
100.0000 mg | ORAL_TABLET | Freq: Every day | ORAL | Status: DC
  Administered 2015-06-04 – 2015-06-10 (×7): 100 mg via ORAL
  Filled 2015-06-04 (×7): qty 1

## 2015-06-04 MED ORDER — FUROSEMIDE 10 MG/ML IJ SOLN
40.0000 mg | Freq: Once | INTRAMUSCULAR | Status: AC
  Administered 2015-06-04: 40 mg via INTRAVENOUS
  Filled 2015-06-04: qty 4

## 2015-06-04 MED ORDER — IOHEXOL 350 MG/ML SOLN
80.0000 mL | Freq: Once | INTRAVENOUS | Status: AC | PRN
Start: 2015-06-04 — End: 2015-06-04
  Administered 2015-06-04: 60 mL via INTRAVENOUS

## 2015-06-04 MED ORDER — SENNA 8.6 MG PO TABS
1.0000 | ORAL_TABLET | Freq: Two times a day (BID) | ORAL | Status: DC
  Administered 2015-06-04 – 2015-06-08 (×9): 8.6 mg via ORAL
  Filled 2015-06-04 (×11): qty 1

## 2015-06-04 MED ORDER — LISINOPRIL-HYDROCHLOROTHIAZIDE 20-12.5 MG PO TABS: 1.0000 | ORAL_TABLET | Freq: Every day | ORAL | Status: DC

## 2015-06-04 MED ORDER — PANTOPRAZOLE SODIUM 40 MG PO TBEC
40.0000 mg | DELAYED_RELEASE_TABLET | Freq: Every day | ORAL | Status: DC
  Administered 2015-06-04 – 2015-06-10 (×7): 40 mg via ORAL
  Filled 2015-06-04 (×7): qty 1

## 2015-06-04 MED ORDER — SODIUM CHLORIDE 0.9 % IV SOLN
INTRAVENOUS | Status: DC
  Administered 2015-06-04 – 2015-06-05 (×2): via INTRAVENOUS

## 2015-06-04 MED ORDER — INSULIN ASPART 100 UNIT/ML ~~LOC~~ SOLN: 0.0000 [IU] | Freq: Three times a day (TID) | SUBCUTANEOUS | Status: DC

## 2015-06-04 MED ORDER — HYDROCHLOROTHIAZIDE 12.5 MG PO CAPS
12.5000 mg | ORAL_CAPSULE | Freq: Every day | ORAL | Status: DC
  Administered 2015-06-04: 12.5 mg via ORAL
  Filled 2015-06-04: qty 1

## 2015-06-04 MED ORDER — PANTOPRAZOLE SODIUM 40 MG IV SOLR: 40.0000 mg | Freq: Every day | INTRAVENOUS | Status: DC

## 2015-06-04 MED ORDER — ADULT MULTIVITAMIN W/MINERALS CH
1.0000 | ORAL_TABLET | Freq: Every day | ORAL | Status: DC
  Administered 2015-06-04 – 2015-06-09 (×6): 1 via ORAL
  Filled 2015-06-04 (×6): qty 1

## 2015-06-04 MED ORDER — LISINOPRIL 20 MG PO TABS
20.0000 mg | ORAL_TABLET | Freq: Every day | ORAL | Status: DC
  Administered 2015-06-04: 20 mg via ORAL
  Filled 2015-06-04 (×2): qty 1

## 2015-06-04 MED ORDER — PRAVASTATIN SODIUM 40 MG PO TABS
40.0000 mg | ORAL_TABLET | Freq: Every day | ORAL | Status: DC
  Administered 2015-06-04 – 2015-06-10 (×7): 40 mg via ORAL
  Filled 2015-06-04 (×7): qty 1

## 2015-06-04 MED ORDER — ONDANSETRON HCL 4 MG PO TABS: 4.0000 mg | ORAL_TABLET | Freq: Four times a day (QID) | ORAL | Status: DC | PRN

## 2015-06-04 MED ORDER — DIVALPROEX SODIUM 125 MG PO CSDR
125.0000 mg | DELAYED_RELEASE_CAPSULE | Freq: Every day | ORAL | Status: DC
  Administered 2015-06-04 – 2015-06-10 (×7): 125 mg via ORAL
  Filled 2015-06-04 (×7): qty 1

## 2015-06-04 MED ORDER — ENSURE ENLIVE PO LIQD
237.0000 mL | Freq: Two times a day (BID) | ORAL | Status: DC
  Administered 2015-06-04 – 2015-06-09 (×7): 237 mL via ORAL

## 2015-06-04 MED ORDER — ONDANSETRON HCL 4 MG/2ML IJ SOLN: 4.0000 mg | Freq: Four times a day (QID) | INTRAMUSCULAR | Status: DC | PRN

## 2015-06-04 MED ORDER — PRO-STAT SUGAR FREE PO LIQD
30.0000 mL | Freq: Two times a day (BID) | ORAL | Status: DC
  Administered 2015-06-04 – 2015-06-10 (×13): 30 mL via ORAL
  Filled 2015-06-04 (×12): qty 30

## 2015-06-04 MED ORDER — METOPROLOL TARTRATE 50 MG PO TABS
50.0000 mg | ORAL_TABLET | Freq: Two times a day (BID) | ORAL | Status: DC
  Administered 2015-06-04 (×2): 50 mg via ORAL
  Filled 2015-06-04 (×3): qty 1

## 2015-06-04 MED ORDER — DIVALPROEX SODIUM 125 MG PO CSDR
250.0000 mg | DELAYED_RELEASE_CAPSULE | Freq: Every day | ORAL | Status: DC
  Administered 2015-06-04 – 2015-06-09 (×6): 250 mg via ORAL
  Filled 2015-06-04 (×6): qty 2

## 2015-06-04 MED ORDER — SODIUM CHLORIDE 0.9 % IJ SOLN
3.0000 mL | Freq: Two times a day (BID) | INTRAMUSCULAR | Status: DC
  Administered 2015-06-05 – 2015-06-07 (×2): 3 mL via INTRAVENOUS

## 2015-06-04 MED ORDER — DIVALPROEX SODIUM 125 MG PO CSDR: 125.0000 mg | DELAYED_RELEASE_CAPSULE | Freq: Two times a day (BID) | ORAL | Status: DC

## 2015-06-04 MED ORDER — POLYETHYLENE GLYCOL 3350 17 G PO PACK
17.0000 g | PACK | Freq: Every day | ORAL | Status: DC
  Administered 2015-06-04 – 2015-06-05 (×2): 17 g via ORAL
  Filled 2015-06-04 (×4): qty 1

## 2015-06-04 MED ORDER — FENTANYL 25 MCG/HR TD PT72
25.0000 ug | MEDICATED_PATCH | TRANSDERMAL | Status: DC
  Administered 2015-06-04 – 2015-06-10 (×3): 25 ug via TRANSDERMAL
  Filled 2015-06-04 (×3): qty 1

## 2015-06-04 NOTE — ED Provider Notes (Signed)
CSN: [id number]     Arrival date & time 06/04/15  0540 History   First MD Initiated Contact with Patient 06/04/15 0550     Chief Complaint  Patient presents with  . Hematemesis     (Consider location/radiation/quality/duration/timing/severity/associated sxs/prior Treatment) HPI Patient has been at [facility] for rehabilitation following a traumatic fall from a tree stand. This occurred in the end of September and the patient had  C3,4,5,and C6 laminectomies, Posterior arthrodesis C3-6, morselized allograft by Dr. Mikal Planeabell last hospitalization on 04/15/2015. He has had complications since then and required PICC line and antibiotic therapy for bacteremia. He has also developed right upper extremity DVT and is on an anticoagulant. Nursing home staff now reports the patient has been coughing up blood for 2 days. He has been getting progressively more confused. Of note, EMR suggests the patient has been having problems with confusion and encephalopathy throughout the course of his recovery in variable degrees. We do not have report of fever however there is report of the patient being at baseline on 2 L nasal cannula oxygen and having had a desaturation into the 70s on his normal O2. He is, place on 6 L with oxygen saturations in the 90s. The patient is a poor historian with confusion and dementia. He does endorse some left sided chest pain. Past Medical History  Diagnosis Date  . Carotid artery occlusion   . Hypertension   . Hyperlipidemia   . Arthritis   . Stroke [facility](H[facility]) 1998 and  1999  . Ulcer     Hiatal Hernia / Gastric ulcers  . History of vagotomy   . Chronic diarrhea   . Fall against object Oct. 16, 2015    Pt got tangled up with dog chain out side in the woods.  . Diabetes mellitus 2006    TYPE 2  . Hemoptysis 06/04/2015   Past Surgical History  Procedure Laterality Date  . Carotid endarterectomy  03/02/2000    Right CEA  . Fracture surgery      Right Shoulder  . Fracture right  leg    . Radiology with anesthesia N/A 04/15/2015    Procedure: MRI CERVICAL SPINE WITHOUT CONTRAST    (RADIOLOGY WITH ANESTHESIA);  Surgeon: Medication Radiologist, MD;  Location: [facility] OR;  Service: Radiology;  Laterality: N/A;  . Posterior cervical fusion/foraminotomy N/A 04/15/2015    Procedure: POSTERIOR CERVICAL FUSION/FORAMINOTOMY LEVEL 3;  Surgeon: Coletta MemosKyle Cabbell, MD;  Location: [facility] OR;  Service: Neurosurgery;  Laterality: N/A;  C3-6 posterior cervical arthrodesis with instrumentation  . Radiology with anesthesia Left 04/30/2015    Procedure: MRI BRAIN WITHOUT CONTRAST, MRI CERVICAL SPINE WITH AND WITHOUT CONTRAST;  Surgeon: Medication Radiologist, MD;  Location: [facility] OR;  Service: Radiology;  Laterality: Left;  DR. Derwood KaplanSULLIVAN/MRI   Family History  Problem Relation Age of Onset  . Cancer Mother     breast cancer  . Other Mother     Circulation problems and lower extremity amputation  . Other Father     cerebrovascular accident   Social History  Substance Use Topics  . Smoking status: Former Smoker    Types: Cigarettes    Quit date: 07/27/1999  . Smokeless tobacco: Never Used  . Alcohol Use: No     Comment: drinks 2 to 3 beers per night    " not since 9/18 2016 "    Review of Systems Cannot obtain review systems to the lobe 5 caveat dementia/confusion   Allergies  Review of patient's allergies indicates  no known allergies.  Home Medications   Prior to Admission medications   Medication Sig Start Date End Date Taking? Authorizing Provider  acetaminophen (TYLENOL) 325 MG tablet Take 650 mg by mouth every 4 (four) hours as needed for mild pain.   Yes Historical Provider, MD  Amino Acids-Protein Hydrolys (FEEDING SUPPLEMENT, PRO-STAT SUGAR FREE 64,) LIQD Take 30 mLs by mouth 2 (two) times daily.   Yes Historical Provider, MD  cefTRIAXone (ROCEPHIN) 1 G injection Inject 1 g into the muscle daily.   Yes Historical Provider, MD  divalproex (DEPAKOTE SPRINKLE) 125 MG capsule Take 125-250  mg by mouth 2 (two) times daily. Take 1 capsule every morning and take 2 capsules every evening   Yes Historical Provider, MD  fentaNYL (DURAGESIC - DOSED MCG/HR) 25 MCG/HR patch Place 25 mcg onto the skin every 3 (three) days.   Yes Historical Provider, MD  folic acid (FOLVITE) 1 MG tablet Take 1 tablet (1 mg total) by mouth daily. 04/22/15  Yes Shanker Levora Dredge, MD  insulin aspart (NOVOLOG) 100 UNIT/ML injection Inject 3 Units into the skin See admin instructions. Every morning if CBG above 250   Yes Historical Provider, MD  latanoprost (XALATAN) 0.005 % ophthalmic solution Place 1 drop into the right eye at bedtime. 04/03/15  Yes Historical Provider, MD  lisinopril-hydrochlorothiazide (PRINZIDE,ZESTORETIC) 20-12.5 MG per tablet Take 1 tablet by mouth daily.   Yes Historical Provider, MD  metoprolol (LOPRESSOR) 50 MG tablet Take 1 tablet (50 mg total) by mouth 2 (two) times daily. 04/22/15  Yes Shanker Levora Dredge, MD  Multiple Vitamin (MULTIVITAMIN WITH MINERALS) TABS tablet Take 1 tablet by mouth daily. 04/22/15  Yes Shanker Levora Dredge, MD  pantoprazole (PROTONIX) 40 MG tablet Take 1 tablet (40 mg total) by mouth daily. 04/22/15  Yes Shanker Levora Dredge, MD  polyethylene glycol (MIRALAX / GLYCOLAX) packet Take 17 g by mouth daily.   Yes Historical Provider, MD  pravastatin (PRAVACHOL) 40 MG tablet Take 40 mg by mouth daily.   Yes Historical Provider, MD  senna (SENOKOT) 8.6 MG TABS tablet Take 1 tablet (8.6 mg total) by mouth 2 (two) times daily. 04/22/15  Yes Shanker Levora Dredge, MD  thiamine 100 MG tablet Take 1 tablet (100 mg total) by mouth daily. 04/22/15  Yes Shanker Levora Dredge, MD  XARELTO STARTER PACK 15 & 20 MG TBPK Take 15-20 mg by mouth as directed. Take as directed on package: Start with one 15mg  tablet by mouth twice a day with food. On Day 22, switch to one 20mg  tablet once a day with food. 05/20/15  Yes Mancel Bale, MD  aspirin 81 MG chewable tablet Chew 1 tablet (81 mg total) by mouth  daily. Patient not taking: Reported on 06/04/2015 04/22/15   Maretta Bees, MD  HYDROcodone-acetaminophen (NORCO/VICODIN) 5-325 MG tablet Take 1 tablet by mouth every 6 (six) hours as needed for severe pain. Patient not taking: Reported on 06/04/2015 05/05/15   Costin Otelia Sergeant, MD   BP 121/42 mmHg  Pulse 59  Temp(Src) 97.4 F (36.3 C) (Oral)  Resp 18  SpO2 93% Physical Exam  Constitutional: He appears well-developed and well-nourished.  Patient is awake and alert and conversant. She does not have acute respiratory distress at rest. He is on nasal cannula oxygen. He has blood on his chin. In my presence he has coughed up a red, soft clot.  HENT:  Head: Normocephalic and atraumatic.  Posterior oropharynx is red blood streaking. Nasal canals have dry blood  present as well. At this time it didn't appreciate any active bleeding from the nasal canals.  Eyes: EOM are normal. Pupils are equal, round, and reactive to light.  Neck: Neck supple.  Patient has a well-healing surgical scar in his posterior neck. No redness or drainage.  Cardiovascular: Normal rate, regular rhythm, normal heart sounds and intact distal pulses.   Pulmonary/Chest: He is in respiratory distress. He has no wheezes (mild increased work of breathing. Speaking in full sentences). He has no rales. He exhibits no tenderness.  Abdominal: Soft. Bowel sounds are normal. He exhibits no distension. There is no tenderness.  Musculoskeletal: Normal range of motion. He exhibits no edema.  Neurological: He is alert. He has normal strength. He exhibits normal muscle tone. Coordination normal. GCS eye subscore is 4. GCS verbal subscore is 5. GCS motor subscore is 6.  Patient is alert but confused.  Skin: Skin is warm, dry and intact.  Psychiatric:  Patient is mildly agitated.    ED Course  Procedures (including critical care time) Labs Review Labs Reviewed  COMPREHENSIVE METABOLIC PANEL - Abnormal; Notable for the following:     Potassium 3.4 (*)    Glucose, Bld 122 (*)    BUN 25 (*)    Calcium 8.7 (*)    Total Protein 5.9 (*)    Albumin 2.6 (*)    All other components within normal limits  CBC WITH DIFFERENTIAL/PLATELET - Abnormal; Notable for the following:    RBC 2.95 (*)    Hemoglobin 8.6 (*)    HCT 28.5 (*)    RDW 15.6 (*)    Lymphs Abs 0.6 (*)    All other components within normal limits  PROTIME-INR - Abnormal; Notable for the following:    Prothrombin Time 32.6 (*)    INR 3.26 (*)    All other components within normal limits  URINALYSIS, ROUTINE W REFLEX MICROSCOPIC (NOT AT [facility]) - Abnormal; Notable for the following:    APPearance CLOUDY (*)    Hgb urine dipstick MODERATE (*)    Leukocytes, UA SMALL (*)    All other components within normal limits  URINE MICROSCOPIC-ADD ON - Abnormal; Notable for the following:    Squamous Epithelial / LPF FEW (*)    Bacteria, UA MANY (*)    Casts HYALINE CASTS (*)    All other components within normal limits  BRAIN NATRIURETIC PEPTIDE - Abnormal; Notable for the following:    B Natriuretic Peptide 446.7 (*)    All other components within normal limits  GLUCOSE, CAPILLARY - Abnormal; Notable for the following:    Glucose-Capillary 101 (*)    All other components within normal limits  CULTURE, BLOOD (ROUTINE X 2)  MRSA PCR SCREENING  CULTURE, BLOOD (ROUTINE X 2)  URINE CULTURE  CATH TIP CULTURE  TROPONIN I  GLUCOSE, CAPILLARY  GLUCOSE, CAPILLARY  I-STAT CG4 LACTIC ACID, ED  TYPE AND SCREEN    Imaging Review Ct Angio Chest Pe W/cm &/or Wo Cm  06/04/2015  CLINICAL DATA:  Hemoptysis. Hypertension. Hyperlipidemia. Prior CVA. Ex-smoker. EXAM: CT ANGIOGRAPHY CHEST WITH CONTRAST TECHNIQUE: Multidetector CT imaging of the chest was performed using the standard protocol during bolus administration of intravenous contrast. Multiplanar CT image reconstructions and MIPs were obtained to evaluate the vascular anatomy. CONTRAST:  60mL OMNIPAQUE IOHEXOL 350 MG/ML  SOLN COMPARISON:  Outside CT dated 04/13/2015. Prior report not submitted. Chest radiograph of 04/27/2015. FINDINGS: Mediastinum/Nodes: Motion and patient position degraded exam. Arms not raised above the head.  No pulmonary embolism to the lobar to large segmental level. A left-sided PICC line which terminates at the left brachiocephalic vein. Aortic and branch vessel atherosclerosis. Mild cardiomegaly. Multivessel coronary artery atherosclerosis. Right paratracheal adenopathy at 1.3 cm (image 36, series 4). This is new since 01/07/2010. Prevascular nodes measure up to the 1.3 cm. Lungs/Pleura: Small to moderate bilateral pleural effusions. Moderate centrilobular emphysema. A 3 mm left lower lobe pulmonary nodule (image 50 for, series 6). Absent in 2011. Apices minimally excluded. Dependent upper and less so lower lobe patchy airspace and interstitial opacities. Upper abdomen: Normal imaged portions of the liver, spleen, pancreas, adrenal glands. gastric diverticulum, (image 92, series 4). Musculoskeletal: Remote left rib trauma. Suspect ankylosing spondylitis throughout the thoracic spine. Review of the MIP images confirms the above findings. IMPRESSION: 1. Motion and position degraded exam. No pulmonary embolism to the large segmental level. 2. Bilateral pleural effusions with bilateral dependent patchy interstitial and airspace opacities. Considerations include pulmonary edema superimposed upon emphysema versus multifocal infection. 3. Adenopathy which could be reactive. Consider followup with chest CT at 3 months to confirm resolution. 4.  Atherosclerosis, including within the coronary arteries. 5. Gastric diverticulum. 6. Probable ankylosing spondylitis. 7. Left lung base nodule. Given risk factors for bronchogenic carcinoma, follow-up chest CT at 1 year is recommended. This recommendation follows the consensus statement: "Guidelines for Management of Small Pulmonary Nodules Detected on CT Scans: A Statement  from the Fleischner Society" as published in Radiology 2005; 237:395-400. Available online at: DietDisorder.cz. 8. Left-sided PICC line terminating at the left brachiocephalic vein. Electronically Signed   By: Jeronimo Greaves M.D.   On: 06/04/2015 07:53   I have personally reviewed and evaluated these images and lab results as part of my medical decision-making.   EKG Interpretation   Date/Time:  Wednesday June 04 2015 06:33:48 EST Ventricular Rate:  79 PR Interval:    QRS Duration: 102 QT Interval:  331 QTC Calculation: 379 R Axis:   48 Text Interpretation:  Atrial fibrillation Probable inferior infarct, age  indeterminate Abnormal lateral Q waves ST depr, consider ischemia,  anterolateral lds Abnrm T, consider ischemia, anterolateral lds Artifact  in lead(s) I II III aVR aVL aVF V1 V2 V3 V4 V5 V6 too much artifact, need  repeat. Confirmed by Donnald Garre, MD, Lebron Conners [phone]) on 06/04/2015 8:01:47 AM      MDM   Final diagnoses:  Hemoptysis   Patient will be admitted for ongoing monitoring and further diagnostic studies for hematemesis. He has multiple severe medical comorbidities. He has also developed increased oxygen requirement. Reportedly patient had oxygen saturation at 70% on his 2 L of nasal cannula oxygen. He has been increased to 6 L and maintains in the mid 90s.    Arby Barrette, MD 06/05/15 [phone]

## 2015-06-04 NOTE — Care Management Note (Signed)
Case Management Note  Patient Details  Name: Charles CannerJohn E Bridges MRN: [id number] Date of Birth: 12-Jun-1936  Subjective/Objective:      Date:06/04/15 Spoke with patient at the bedside along with [facility] Vacc[facility]lla [phone]. Introduced self as Sports coachcase manager and explained role in discharge planning and how to be reached.  Verified [place] in [place], with [place] . Expressed potential need for no other DME. Verified patient anticipates to go back to [facility] at time of discharge and will have full-time supervision. Patient denied needing help with their medication. Patient  is driven by spouse to MD appointments.  Verified patient has [place] [facility] [place].   Plan: CM will continue to follow for discharge planning and [facility] resources.               Action/Plan:   Expected Discharge Date:                  Expected Discharge Plan:  Skilled Nursing Facility  In-House Referral:  Clinical Social Work  Discharge planning Services  CM Consult  Post Acute Care Choice:    Choice offered to:     DME Arranged:    DME Agency:     HH Arranged:    HH Agency:     Status of Service:  Completed, signed off  Medicare Important Message Given:    Date Medicare IM Given:    Medicare IM give by:    Date Additional Medicare IM Given:    Additional Medicare Important Message give by:     If discussed at Long Length of Stay Meetings, dates discussed:    Additional Comments:  Leone Havenaylor, Walid Haig Clinton, RN 06/04/2015, 11:40 AM

## 2015-06-04 NOTE — Consult Note (Addendum)
WOC wound consult note Reason for Consult: Consult requested for BLE. Left heel with stage 1 pressure injury; .5X.5cm red, nonblanchable, with intact skin and no open wound or drainage. Right heel with 2 areas of stage 2 pressure injuries; 1X1X.1cm and 4X1X.1cm, both dark red and moist, no odor, small amt pink drainage. Wound type: Right plantar foot with full thickness wound; 3X3X.2cm, red and moist, small amt blood-tinged drainage. Pressure Ulcer POA: Yes Dressing procedure/placement/frequency: Foam dressing to protect affected areas and promote healing.  Prevalon boots are in place to reduce pressure to BLE.  Discussed plan of care with wife at the bedside. Please re-consult if further assistance is needed.  Thank-you,  Cammie Mcgeeawn Duward Allbritton MSN, RN, CWOCN, HendersonWCN-AP, CNS [phone]

## 2015-06-04 NOTE — ED Notes (Signed)
Patient arrived at ED via [facility]. EMS reports: patient is a resident at [facility] [place]. Patient had first gone to [facility] for rehab following traumatic fall from tree stand. Patient admitted in October for PNA/staph infection. SNF staff reports that patient has been coughing up blood x 2 days, and has had progressive confusion. Patient is diabetic. CBG 190. BP 128/40, Pulse 82. Clear lungs. EMS reports patient on 2 LPM via nasal cannula, but O2 sats in 70s. Patient placed on 6 LPM. O2 sats low 90s.

## 2015-06-04 NOTE — Progress Notes (Signed)
Paged Dr regarding concerns for airborne precautions to r/o TB. Dr Konrad DoloresMerrell called nurse back and does not feel this is TB related. No precautions needed at this time.

## 2015-06-04 NOTE — Clinical Social Work Note (Signed)
Clinical Social Work Assessment  Patient Details  Name: Charles CannerJohn E Bridges MRN: [id number] Date of Birth: 10-29-1935  Date of referral:  06/04/15               Reason for consult:  Facility Placement                Permission sought to share information with:  Family Supports, Oceanographeracility Contact Representative Permission granted to share information::   (pt disoriented)  Name::     Marcha SoldersShirley Granquist  Agency::  [facility]  Relationship::  wife  Contact Information:     Housing/Transportation Living arrangements for the past 2 months:  Skilled Building surveyorursing Facility Source of Information:  Spouse Patient Interpreter Needed:  None Criminal Activity/Legal Involvement Pertinent to Current Situation/Hospitalization:  No - Comment as needed Significant Relationships:  None Lives with:  Facility Resident Do you feel safe going back to the place where you live?  Yes Need for family participation in patient care:  Yes (Comment) (decision making)  Care giving concerns:  None- pt is [facility]TC [facility] at [facility]   Social Worker assessment / plan:  CSW discussed plan for return to [facility] at time of DC  Employment status:  Retired Database administratornsurance information:  Managed Medicare PT Recommendations:  Not assessed at this time Information / Referral to community resources:  Skilled Nursing Facility  Patient/Family's Response to care:  Pt wife is agreeable to return to [facility]- states pt is LTC resident  Patient/Family's Understanding of and Emotional Response to Diagnosis, Current Treatment, and Prognosis:  No questions or concerns at this time.  Emotional Assessment Appearance:  Appears stated age Attitude/Demeanor/Rapport:  Unable to Assess Affect (typically observed):  Unable to Assess Orientation:  Oriented to Self Alcohol / Substance use:  Not Applicable Psych involvement (Current and /or in the community):  No (Comment)  Discharge Needs  Concerns to be addressed:  Care Coordination Readmission within  the last 30 days:  No Current discharge risk:  None Barriers to Discharge:  Continued Medical Work up   Peabody EnergyHoloman, Connie Hilgert M, LCSW 06/04/2015, 5:01 PM

## 2015-06-04 NOTE — H&P (Signed)
[facility] History and Physical  Charles Bridges ION:[id number] DOB: 02/06/1936 DOA: 06/04/2015  Referring physician: Emergency Department PCP: Ailene RavelHAMRICK,MAURA L, MD   CHIEF COMPLAINT: Coughing up blood   HPI: Charles Bridges is a 79 y.o. male with multiple medical problems chronically anticoagulated for history of atrial fibrillation/CVAs. Patient was on Coumadin, changeed to Xarelto approximately one week ago . He sustained a cervical fracture after a fall in late September, status post decompression in October. Readmitted early October with staff bacteremia requiring prolonged antibiotics. Patient developed thrombus in RUE where PICC was placed, PICC now in LUE. He follows with Dr. Jerolyn Centerynthia Snyder, infectious disease. Patient still getting IV antibiotics at [facility] he has been for rehabilitation following the fall  Patient brought into the hospital today for hemoptysis, hypoxia  and mental status changes on home 02, sats in 70's this am. Patient has a Foley catheter. He denies abdominal pain. Wife at bedside and mention some recent nausea but no known history of vomiting. No known passage of melenic stools  ED COURSE:     Patient hypoxic with O2 saturations of 70% on oxygen at [facility].  Currently requiring 6 L per nasal cannula  Labs:   Potassium 3.4, BUN 25, creatinine 1.12, glucose 122, albumin 2.6, troponin 0.03, lactic acid 1.15, wbc 7.8, hemoglobin 8.6, INR 3.26  Urinalysis:   Cloudy, many bacteria, hyaline cast, negative nitrites, 7-10 wbc.    EKG:    Atrial fibrillation Probable inferior infarct, age indeterminate Abnormal lateral Q waves ST depr, consider ischemia, anterolateral lds Abnrm T, consider ischemia, anterolateral lds Artifact in lead(s) I II III aVR aVL aVF V1 V2 V3 V4 V5 V6   Medications  iohexol (OMNIPAQUE) 350 MG/ML injection 80 mL (60 mLs Intravenous Contrast Given 06/04/15 0714)    Review of Systems  Unable to perform ROS  Past Medical History   Diagnosis Date  . Carotid artery occlusion   . Hypertension   . Hyperlipidemia   . Arthritis   . Stroke [facility](HCC) 1998 and  1999  . Ulcer     Hiatal Hernia / Gastric ulcers  . History of vagotomy   . Chronic diarrhea   . Fall against object Oct. 16, 2015    Pt got tangled up with dog chain out side in the woods.  . Diabetes mellitus 2006    TYPE 2   Past Surgical History  Procedure Laterality Date  . Carotid endarterectomy  03/02/2000    Right CEA  . Fracture surgery      Right Shoulder  . Fracture right leg    . Radiology with anesthesia N/A 04/15/2015    Procedure: MRI CERVICAL SPINE WITHOUT CONTRAST    (RADIOLOGY WITH ANESTHESIA);  Surgeon: Medication Radiologist, MD;  Location: [facility] OR;  Service: Radiology;  Laterality: N/A;  . Posterior cervical fusion/foraminotomy N/A 04/15/2015    Procedure: POSTERIOR CERVICAL FUSION/FORAMINOTOMY LEVEL 3;  Surgeon: Coletta MemosKyle Cabbell, MD;  Location: [facility] OR;  Service: Neurosurgery;  Laterality: N/A;  C3-6 posterior cervical arthrodesis with instrumentation  . Radiology with anesthesia Left 04/30/2015    Procedure: MRI BRAIN WITHOUT CONTRAST, MRI CERVICAL SPINE WITH AND WITHOUT CONTRAST;  Surgeon: Medication Radiologist, MD;  Location: [facility] OR;  Service: Radiology;  Laterality: Left;  DR. Derwood KaplanSULLIVAN/MRI    SOCIAL HISTORY:  reports that he quit smoking about 15 years ago. His smoking use included Cigarettes. He has never used smokeless tobacco. He reports that he drinks alcohol. He reports that he does not use illicit drugs.  Lives:  At [facility] devices:   Currently not ambulatory. .   No Known Allergies  Family History  Problem Relation Age of Onset  . Cancer Mother     breast cancer  . Other Mother     Circulation problems and lower extremity amputation  . Other Father     cerebrovascular accident    Prior to Admission medications   Medication Sig Start Date End Date Taking? Authorizing Provider  acetaminophen (TYLENOL) 325 MG tablet Take  650 mg by mouth every 4 (four) hours as needed for mild pain.   Yes Historical Provider, MD  Amino Acids-Protein Hydrolys (FEEDING SUPPLEMENT, PRO-STAT SUGAR FREE 64,) LIQD Take 30 mLs by mouth 2 (two) times daily.   Yes Historical Provider, MD  cefTRIAXone (ROCEPHIN) 1 G injection Inject 1 g into the muscle daily.   Yes Historical Provider, MD  divalproex (DEPAKOTE SPRINKLE) 125 MG capsule Take 125-250 mg by mouth 2 (two) times daily. Take 1 capsule every morning and take 2 capsules every evening   Yes Historical Provider, MD  fentaNYL (DURAGESIC - DOSED MCG/HR) 25 MCG/HR patch Place 25 mcg onto the skin every 3 (three) days.   Yes Historical Provider, MD  folic acid (FOLVITE) 1 MG tablet Take 1 tablet (1 mg total) by mouth daily. 04/22/15  Yes Shanker Levora Dredge, MD  insulin aspart (NOVOLOG) 100 UNIT/ML injection Inject 3 Units into the skin See admin instructions. Every morning if CBG above 250   Yes Historical Provider, MD  latanoprost (XALATAN) 0.005 % ophthalmic solution Place 1 drop into the right eye at bedtime. 04/03/15  Yes Historical Provider, MD  lisinopril-hydrochlorothiazide (PRINZIDE,ZESTORETIC) 20-12.5 MG per tablet Take 1 tablet by mouth daily.   Yes Historical Provider, MD  metoprolol (LOPRESSOR) 50 MG tablet Take 1 tablet (50 mg total) by mouth 2 (two) times daily. 04/22/15  Yes Shanker Levora Dredge, MD  Multiple Vitamin (MULTIVITAMIN WITH MINERALS) TABS tablet Take 1 tablet by mouth daily. 04/22/15  Yes Shanker Levora Dredge, MD  pantoprazole (PROTONIX) 40 MG tablet Take 1 tablet (40 mg total) by mouth daily. 04/22/15  Yes Shanker Levora Dredge, MD  polyethylene glycol (MIRALAX / GLYCOLAX) packet Take 17 g by mouth daily.   Yes Historical Provider, MD  pravastatin (PRAVACHOL) 40 MG tablet Take 40 mg by mouth daily.   Yes Historical Provider, MD  senna (SENOKOT) 8.6 MG TABS tablet Take 1 tablet (8.6 mg total) by mouth 2 (two) times daily. 04/22/15  Yes Shanker Levora Dredge, MD  thiamine 100 MG tablet  Take 1 tablet (100 mg total) by mouth daily. 04/22/15  Yes Shanker Levora Dredge, MD  XARELTO STARTER PACK 15 & 20 MG TBPK Take 15-20 mg by mouth as directed. Take as directed on package: Start with one 15mg  tablet by mouth twice a day with food. On Day 22, switch to one 20mg  tablet once a day with food. 05/20/15  Yes Mancel Bale, MD  aspirin 81 MG chewable tablet Chew 1 tablet (81 mg total) by mouth daily. Patient not taking: Reported on 06/04/2015 04/22/15   Maretta Bees, MD  HYDROcodone-acetaminophen (NORCO/VICODIN) 5-325 MG tablet Take 1 tablet by mouth every 6 (six) hours as needed for severe pain. Patient not taking: Reported on 06/04/2015 05/05/15   Leatha Gilding, MD  vancomycin (VANCOCIN) 1 GM/200ML SOLN Inject 200 mLs (1,000 mg total) into the vein every 12 (twelve) hours. End date 05/28/2015 Patient not taking: Reported on 06/04/2015 05/05/15  Leatha Gilding, MD   PHYSICAL EXAMCeasar Mons Vitals:   06/04/15 0549 06/04/15 0754 06/04/15 0756  BP: 144/44 139/49   Pulse: 76  71  Temp: 98.6 F (37 C)    TempSrc: Oral    Resp: SpO2: 93%  100%    Wt Readings from Last 3 Encounters:  05/05/15 98.657 kg (217 lb 8 oz)  04/13/15 103.148 kg (227 lb 6.4 oz)  05/22/14 102.513 kg (226 lb)    General:  Pleasant white male. Appears calm and comfortable Eyes: PER, normal lids, irises & conjunctiva ENT: HOH. Dried blood around mouth and in oral cavity Neck: no LAD, no masses Cardiovascular: Regular rate, irregular rhythm. No lower extremity edema Respiratory: Respirations even and unlabored. Normal respiratory effort. Lungs CTA bilaterally, no wheezes / rales .   Abdomen: soft, non-distended, non-tender, active bowel sounds. No obvious masses.  Skin: no rash seen on limited exam Musculoskeletal: Decreased muscle strength in left hand  Extremitis: LUE PICC Psychiatric: grossly normal mood and affect, speech fluent and appropriate Neurologic: grossly non-focal.         LABS ON  ADMISSION:    Basic Metabolic Panel:  Recent Labs Lab 06/03/15 1511 06/04/15 0632  NA 142 142  K 3.5 3.4*  CL 104 106  CO2 25 24  GLUCOSE 119* 122*  BUN 26* 25*  CREATININE 0.97 1.12  CALCIUM 8.1* 8.7*   Liver Function Tests:  Recent Labs Lab 06/03/15 1511 06/04/15 0632  AST 18 29  ALT 13 17  ALKPHOS 66 62  BILITOT 0.4 0.4  PROT 5.4* 5.9*  ALBUMIN 2.8* 2.6*     CBC:  Recent Labs Lab 06/03/15 1511 06/04/15 0632  WBC 6.6 7.8  NEUTROABS 5.1 6.3  HGB 8.4* 8.6*  HCT 25.9* 28.5*  MCV 93.8 96.6  PLT 327 356     Radiological Exams on Admission: Ct Angio Chest Pe W/cm &/or Wo Cm  06/04/2015  CLINICAL DATA:  Hemoptysis. Hypertension. Hyperlipidemia. Prior CVA. Ex-smoker. EXAM: CT ANGIOGRAPHY CHEST WITH CONTRAST TECHNIQUE: Multidetector CT imaging of the chest was performed using the standard protocol during bolus administration of intravenous contrast. Multiplanar CT image reconstructions and MIPs were obtained to evaluate the vascular anatomy. CONTRAST:  60mL OMNIPAQUE IOHEXOL 350 MG/ML SOLN COMPARISON:  Outside CT dated 04/13/2015. Prior report not submitted. Chest radiograph of 04/27/2015. FINDINGS: Mediastinum/Nodes: Motion and patient position degraded exam. Arms not raised above the head. No pulmonary embolism to the lobar to large segmental level. A left-sided PICC line which terminates at the left brachiocephalic vein. Aortic and branch vessel atherosclerosis. Mild cardiomegaly. Multivessel coronary artery atherosclerosis. Right paratracheal adenopathy at 1.3 cm (image 36, series 4). This is new since 01/07/2010. Prevascular nodes measure up to the 1.3 cm. Lungs/Pleura: Small to moderate bilateral pleural effusions. Moderate centrilobular emphysema. A 3 mm left lower lobe pulmonary nodule (image 50 for, series 6). Absent in 2011. Apices minimally excluded. Dependent upper and less so lower lobe patchy airspace and interstitial opacities. Upper abdomen: Normal imaged  portions of the liver, spleen, pancreas, adrenal glands. gastric diverticulum, (image 92, series 4). Musculoskeletal: Remote left rib trauma. Suspect ankylosing spondylitis throughout the thoracic spine. Review of the MIP images confirms the above findings. IMPRESSION: 1. Motion and position degraded exam. No pulmonary embolism to the large segmental level. 2. Bilateral pleural effusions with bilateral dependent patchy interstitial and airspace opacities. Considerations include pulmonary edema superimposed upon emphysema versus multifocal infection. 3. Adenopathy which could be reactive. Consider followup  with chest CT at 3 months to confirm resolution. 4.  Atherosclerosis, including within the coronary arteries. 5. Gastric diverticulum. 6. Probable ankylosing spondylitis. 7. Left lung base nodule. Given risk factors for bronchogenic carcinoma, follow-up chest CT at 1 year is recommended. This recommendation follows the consensus statement: "Guidelines for Management of Small Pulmonary Nodules Detected on CT Scans: A Statement from the Fleischner Society" as published in Radiology 2005; 237:395-400. Available online at: DietDisorder.cz. 8. Left-sided PICC line terminating at the left brachiocephalic vein. Electronically Signed   By: Jeronimo Greaves M.D.   On: 06/04/2015 07:53     ASSESSMENT / PLAN   Acute respiratory failure with underlying COPD on home oxygen. Chest CTA reveals bilateral pleural effusions with bilateral dependent patchy interstitial and airspace opacities. Pulmonary edema superimposed on emphysema versus multifocal infection. No significant peripheral edema.  -Admit to telemetry  -Continue O2 therapy to maintain sats above 88-92%  -check BNP -lasix  IV x1. Reassess breathing, sats in am. . - ABG  Hemoptysis vrs hematemesis. No melenic stools on exam. Doubt upper GI bleed. . Wife describes more of hemoptysis. Bleeding in the setting of  anticoagulation.  -hold Xarelto.  -PPI therapy -Hemoglobin 8.6, which is around baseline. Monitor CBC.   Acute encephalopathy, multifactorial. Probably related to hypoxia, possibly infection. - treatment as above  Coagulase negative staphylococcus bacteremia diagnosed October 2016. Marland Kitchen Has LUE PICC, receiving IV antibiotics at [facility] under the direction of infectious disease. Patient still receiving IV antibiotics. Seeing ID outpt -will consult ID - cath tip Cx - remove PICC  Diabetes, type 2.  -Check CBGs and place on sliding scale insulin  Chronic indwelling foley, last changed early October. UA this admission with + WBC, many bacteria. Will send for culture (suspect poor speciation due to chronic indwelling catheter) - DC in am (diuresis overnight) - Bladder scan 6hrs after removal  Right foot wound, Dry dressing intact . Hx of deep tissue injuries to both hees and right achilles. Will ask WOC to evaluate  Hx of RUE thrombus: likely secondary to RUE trauma and PICC. On Xarelto.  - consider restart Xarelto in am (hemoptosys on admission)  Cervical spinal cord injury without evidence of spinal bone injury in September 2016.   CONSULTANTS:   Infectious Disease   Code Status: Limited. DO NOT INTUBATE DVT Prophylaxis: SCDs Family Communication:  Wife at bedside. She understands the plan of care.  Disposition Plan: Discharge back to [facility] in 2-3 days.   Time spent: 60 minutes Willette Cluster  NP [facility] Pager [phone]

## 2015-06-04 NOTE — ED Notes (Signed)
Pt transported to CT wearing mask due to hemoptysis.

## 2015-06-04 NOTE — Progress Notes (Signed)
NURSING PROGRESS NOTE  Charles Bridges [id number] Admission Data: 06/04/2015 9:41 AM Attending Provider: No att. providers found EXB:MWUXLKG,MWNUUPCP:HAMRICK,MAURA L, MD Code Status: Partial   Charles Bridges is a 79 y.o. male patient admitted from ED:  -No acute distress noted.  -No complaints of shortness of breath.  -No complaints of chest pain.   Cardiac Monitoring: Box #  in place. Cardiac monitor yields:  Blood pressure 141/62, pulse 78, temperature 98.6 F (37 C), temperature source Oral, resp. rate 20, SpO2 92 %.   IV Fluids:  IV in place, occlusive dsg intact without redness,  Allergies:  Review of patient's allergies indicates no known allergies.  Past Medical History:   has a past medical history of Carotid artery occlusion; Hypertension; Hyperlipidemia; Arthritis; Stroke (HCC) (1998 and  1999); Ulcer; History of vagotomy; Chronic diarrhea; Fall against object (Oct. 16, 2015); and Diabetes mellitus (2006).  Past Surgical History:   has past surgical history that includes Carotid endarterectomy (03/02/2000); Fracture surgery; fracture right leg; Radiology with anesthesia (N/A, 04/15/2015); Posterior cervical fusion/foraminotomy (N/A, 04/15/2015); and Radiology with anesthesia (Left, 04/30/2015).  Social History:   reports that he quit smoking about 15 years ago. His smoking use included Cigarettes. He has never used smokeless tobacco. He reports that he drinks alcohol. He reports that he does not use illicit drugs.  Skin:   Patient/Family orientated to room. Information packet given to patient/family. Admission inpatient armband information verified with patient/family to include name and date of birth and placed on patient arm. Side rails up x 2, fall assessment and education completed with patient/family. Patient/family able to verbalize understanding of risk associated with falls and verbalized understanding to call for assistance before getting out of bed. Call light within reach. Patient/family  able to voice and demonstrate understanding of unit orientation instructions.    Will continue to evaluate and treat per MD orders.  Bennie Pieriniyndi Rosetta Rupnow, RN

## 2015-06-04 NOTE — ED Notes (Signed)
Phlebotomy at bedside at this time.

## 2015-06-04 NOTE — ED Notes (Signed)
Admitting at bedside, DR. Merrell shown EKG.

## 2015-06-04 NOTE — Consult Note (Addendum)
[facility]       Reason for Consult: history of CoNS bacteremia    Referring Physician: Dr. Konrad Dolores  Active Problems:   History of CVA (cerebrovascular accident)   Cervical spinal cord injury without evidence of spinal bone injury (HCC)   Acute respiratory failure, unspecified whether with hypoxia or hypercapnia (HCC)   Acute encephalopathy   UTI (urinary tract infection)   Left-sided weakness   Anemia   Gram-positive cocci bacteremia   Coagulase negative Staphylococcus bacteremia   Hemoptysis   Diabetes mellitus, type 2 (HCC)   COPD (chronic obstructive pulmonary disease) (HCC)   Acute respiratory failure (HCC)   . divalproex  125 mg Oral Daily   And  . divalproex  250 mg Oral Q2000  . feeding supplement (ENSURE ENLIVE)  237 mL Oral BID BM  . feeding supplement (PRO-STAT SUGAR FREE 64)  30 mL Oral BID  . fentaNYL  25 mcg Transdermal Q72H  . lisinopril  20 mg Oral Daily   And  . hydrochlorothiazide  12.5 mg Oral Daily  . insulin aspart  0-5 Units Subcutaneous QHS  . insulin aspart  0-9 Units Subcutaneous TID WC  . latanoprost  1 drop Right Eye QHS  . metoprolol  50 mg Oral BID  . pantoprazole  40 mg Oral Daily  . polyethylene glycol  17 g Oral Daily  . pravastatin  40 mg Oral Daily  . senna  1 tablet Oral BID  . sodium chloride  3 mL Intravenous Q12H  . thiamine  100 mg Oral Daily    Recommendations: Continue off of vancomycin  Assessment: He has recent history of CoNS bacteremia, 2/2, in the setting of recent back surgery and seroma.  Bacteremia has cleared. No indication for continued treatment.  Hypoxia but no fever or leukocytosis concerning for infection.     Antibiotics: None currently, was on vancomycin prior to admission  HPI: Charles Bridges is a 79 y.o. male with a history of afib, CVAs, history of CoNS bacteremia, seroma and concern for infection on vancomycin with picc line who came in from NH with hypoxia.  Some  mental status changes.  Picc line now removed.   Normal WBC, afebrile.  Feels better since admission with breathing. Previous hospitalization noted and CoNS 2/2 with follow up cultures negative.  CT chest independently reviewed, no opacity, has pleural effusions.   Review of Systems:  Constitutional: negative for fevers and chills Gastrointestinal: negative for nausea and diarrhea All other systems reviewed and are negative   Past Medical History  Diagnosis Date  . Carotid artery occlusion   . Hypertension   . Hyperlipidemia   . Arthritis   . Stroke [facility]) 1998 and  1999  . Ulcer     Hiatal Hernia / Gastric ulcers  . History of vagotomy   . Chronic diarrhea   . Fall against object Oct. 16, 2015    Pt got tangled up with dog chain out side in the woods.  . Diabetes mellitus 2006    TYPE 2  . Hemoptysis 06/04/2015    Social History  Substance Use Topics  . Smoking status: Former Smoker    Types: Cigarettes    Quit date: 07/27/1999  . Smokeless tobacco: Never Used  . Alcohol Use: No     Comment: drinks 2 to 3 beers per night    " not since 9/18 2016 "    Family History  Problem Relation Age of Onset  .  Cancer Mother     breast cancer  . Other Mother     Circulation problems and lower extremity amputation  . Other Father     cerebrovascular accident    No Known Allergies  Physical Exam: Constitutional: in no apparent distress and alert  Filed Vitals:   06/04/15 1357  BP: 129/48  Pulse: 134  Temp:   Resp: 18   EYES: anicteric ENMT:no thrush Cardiovascular: Cor irreg, irreg RRR Respiratory: clear; GI: Bowel sounds are normal Musculoskeletal: peripheral pulses normal, no pedal edema, no clubbing or cyanosis Skin: negatives: no rash Neuro: left arm with limited movement, slurred speech  Lab Results  Component Value Date   WBC 7.8 06/04/2015   HGB 8.6* 06/04/2015   HCT 28.5* 06/04/2015   MCV 96.6 06/04/2015   PLT 356 06/04/2015    Lab Results    Component Value Date   CREATININE 1.12 06/04/2015   BUN 25* 06/04/2015   NA 142 06/04/2015   K 3.4* 06/04/2015   CL 106 06/04/2015   CO2 24 06/04/2015    Lab Results  Component Value Date   ALT 17 06/04/2015   AST 29 06/04/2015   ALKPHOS 62 06/04/2015     Microbiology: Recent Results (from the past 240 hour(s))  Culture, blood (routine x 2)     Status: None (Preliminary result)   Collection Time: 06/04/15  6:14 AM  Result Value Ref Range Status   Specimen Description BLOOD RIGHT ARM  Final   Special Requests BOTTLES DRAWN AEROBIC ONLY 10CC  Final   Culture PENDING  Incomplete   Report Status PENDING  Incomplete    COMER, Molly MaduroOBERT, MD [facility] [email or web address] [phone] pager  [phone] cell 06/04/2015, 3:42 PM

## 2015-06-04 NOTE — Care Management Obs Status (Signed)
MEDICARE OBSERVATION STATUS NOTIFICATION   Patient Details  Name: Charles Bridges MRN: [id number] Date of Birth: [birth year]   Medicare Observation Status Notification Given:  Yes    Leone Havenaylor, Medina Degraffenreid Clinton, RN 06/04/2015, 11:45 AM

## 2015-06-04 NOTE — Progress Notes (Signed)
Initial Nutrition Assessment  DOCUMENTATION CODES:   Not applicable  INTERVENTION:   -MVI daily -Continue Ensure Enlive po BID, each supplement provides 350 kcal and 20 grams of protein -Continue 30 ml Prostat BID  NUTRITION DIAGNOSIS:   Increased nutrient needs related to wound healing as evidenced by estimated needs.  GOAL:   Patient will meet greater than or equal to 90% of their needs  MONITOR:   PO intake, Supplement acceptance, Labs, Weight trends, Skin, I & O's  REASON FOR ASSESSMENT:   Malnutrition Screening Tool    ASSESSMENT:   Charles Bridges is a 79 y.o. male with multiple medical problems chronically anticoagulated for history of atrial fibrillation/CVAs. Patient was on Coumadin, changeed to Xarelto approximately one week ago . He sustained a cervical fracture after a fall in late September, status post decompression in October. Readmitted early October with staff bacteremia requiring prolonged antibiotics. Patient developed thrombus in RUE where PICC was placed, PICC now in LUE. He follows with Dr. Jerolyn Centerynthia Snyder, infectious disease. Patient still getting IV antibiotics at [facility] he has been for rehabilitation following the fall  Pt admitted with acute encephalopathy.   Pt somnolent at time of visit. He did not arouse during exam.  Meal completion 35% per doc flowsheets. Pt has Ensure Enlive BID and Prostat BID ordered. RD will continue supplement orders, as pt has increased nutritional needs due to wound healing.  Reviewed wt hx. Noted wt loss over the past month, however, wt changes not significant for time frame.  Nutrition-Focused physical exam completed. Findings are no fat depletion, no muscle depletion, and no edema.   Reviewed [facility]RN note from 06/04/15; pt with rt plantar foot full thickness wound, two stage II pressure injuries on rt heel, and stage I pressure injury on lt heel. Pt has been ordered prevalon boots and foam dressings.   Labs reviewed.    Diet Order:  Diet Heart Room service appropriate?: Yes; Fluid consistency:: Thin  Skin:  Wound (see comment) (st I lt heel, st II rt heel, full thickness rt foot)  Last BM:  PTA  Height:   Ht Readings from Last 1 Encounters:  04/27/15 6' 1.5" (1.867 m)    Weight:   Wt Readings from Last 1 Encounters:  05/05/15 217 lb 8 oz (98.657 kg)    Ideal Body Weight:  85 kg  BMI:  Estimated body mass index is 28.30 kg/(m^2) as calculated from the following:   Height as of 04/27/15: 6' 1.5" (1.867 m).   Weight as of 04/27/15: 217 lb 8 oz (98.657 kg).  Estimated Nutritional Needs:   Kcal:  2200-2400  Protein:  115-130 grams  Fluid:  2.2-2.4 L  EDUCATION NEEDS:   No education needs identified at this time  Lemond Griffee A. Mayford KnifeWilliams, RD, LDN, CDE Pager: [phone] After hours Pager: [phone]

## 2015-06-05 ENCOUNTER — Observation Stay (HOSPITAL_COMMUNITY): Payer: Medicare Other

## 2015-06-05 DIAGNOSIS — J410 Simple chronic bronchitis: Secondary | ICD-10-CM | POA: Diagnosis not present

## 2015-06-05 DIAGNOSIS — Z7982 Long term (current) use of aspirin: Secondary | ICD-10-CM | POA: Diagnosis not present

## 2015-06-05 DIAGNOSIS — Z87891 Personal history of nicotine dependence: Secondary | ICD-10-CM | POA: Diagnosis not present

## 2015-06-05 DIAGNOSIS — J449 Chronic obstructive pulmonary disease, unspecified: Secondary | ICD-10-CM | POA: Diagnosis present

## 2015-06-05 DIAGNOSIS — J811 Chronic pulmonary edema: Secondary | ICD-10-CM | POA: Diagnosis not present

## 2015-06-05 DIAGNOSIS — Z9981 Dependence on supplemental oxygen: Secondary | ICD-10-CM | POA: Diagnosis not present

## 2015-06-05 DIAGNOSIS — G9340 Encephalopathy, unspecified: Secondary | ICD-10-CM | POA: Diagnosis present

## 2015-06-05 DIAGNOSIS — Z794 Long term (current) use of insulin: Secondary | ICD-10-CM | POA: Diagnosis not present

## 2015-06-05 DIAGNOSIS — Z803 Family history of malignant neoplasm of breast: Secondary | ICD-10-CM | POA: Diagnosis not present

## 2015-06-05 DIAGNOSIS — R042 Hemoptysis: Secondary | ICD-10-CM | POA: Diagnosis present

## 2015-06-05 DIAGNOSIS — R7881 Bacteremia: Secondary | ICD-10-CM | POA: Diagnosis not present

## 2015-06-05 DIAGNOSIS — Z823 Family history of stroke: Secondary | ICD-10-CM | POA: Diagnosis not present

## 2015-06-05 DIAGNOSIS — I4891 Unspecified atrial fibrillation: Secondary | ICD-10-CM | POA: Diagnosis present

## 2015-06-05 DIAGNOSIS — J9811 Atelectasis: Secondary | ICD-10-CM | POA: Diagnosis not present

## 2015-06-05 DIAGNOSIS — Z7901 Long term (current) use of anticoagulants: Secondary | ICD-10-CM | POA: Diagnosis not present

## 2015-06-05 DIAGNOSIS — I110 Hypertensive heart disease with heart failure: Secondary | ICD-10-CM | POA: Diagnosis present

## 2015-06-05 DIAGNOSIS — D649 Anemia, unspecified: Secondary | ICD-10-CM | POA: Diagnosis present

## 2015-06-05 DIAGNOSIS — S14109D Unspecified injury at unspecified level of cervical spinal cord, subsequent encounter: Secondary | ICD-10-CM | POA: Diagnosis not present

## 2015-06-05 DIAGNOSIS — J9601 Acute respiratory failure with hypoxia: Secondary | ICD-10-CM | POA: Diagnosis present

## 2015-06-05 DIAGNOSIS — L89612 Pressure ulcer of right heel, stage 2: Secondary | ICD-10-CM | POA: Diagnosis present

## 2015-06-05 DIAGNOSIS — D638 Anemia in other chronic diseases classified elsewhere: Secondary | ICD-10-CM | POA: Diagnosis present

## 2015-06-05 DIAGNOSIS — J90 Pleural effusion, not elsewhere classified: Secondary | ICD-10-CM | POA: Diagnosis present

## 2015-06-05 DIAGNOSIS — T45515A Adverse effect of anticoagulants, initial encounter: Secondary | ICD-10-CM | POA: Diagnosis present

## 2015-06-05 DIAGNOSIS — W1789XS Other fall from one level to another, sequela: Secondary | ICD-10-CM | POA: Diagnosis not present

## 2015-06-05 DIAGNOSIS — D6832 Hemorrhagic disorder due to extrinsic circulating anticoagulants: Secondary | ICD-10-CM | POA: Diagnosis present

## 2015-06-05 DIAGNOSIS — F0390 Unspecified dementia without behavioral disturbance: Secondary | ICD-10-CM | POA: Diagnosis present

## 2015-06-05 DIAGNOSIS — R0489 Hemorrhage from other sites in respiratory passages: Secondary | ICD-10-CM | POA: Diagnosis present

## 2015-06-05 DIAGNOSIS — J9600 Acute respiratory failure, unspecified whether with hypoxia or hypercapnia: Secondary | ICD-10-CM | POA: Diagnosis not present

## 2015-06-05 DIAGNOSIS — D6489 Other specified anemias: Secondary | ICD-10-CM | POA: Diagnosis not present

## 2015-06-05 DIAGNOSIS — L89621 Pressure ulcer of left heel, stage 1: Secondary | ICD-10-CM | POA: Diagnosis present

## 2015-06-05 DIAGNOSIS — R627 Adult failure to thrive: Secondary | ICD-10-CM | POA: Diagnosis present

## 2015-06-05 DIAGNOSIS — I5031 Acute diastolic (congestive) heart failure: Secondary | ICD-10-CM | POA: Diagnosis present

## 2015-06-05 DIAGNOSIS — N390 Urinary tract infection, site not specified: Secondary | ICD-10-CM | POA: Diagnosis present

## 2015-06-05 DIAGNOSIS — E119 Type 2 diabetes mellitus without complications: Secondary | ICD-10-CM | POA: Diagnosis present

## 2015-06-05 DIAGNOSIS — H9190 Unspecified hearing loss, unspecified ear: Secondary | ICD-10-CM | POA: Diagnosis present

## 2015-06-05 DIAGNOSIS — S14123A Central cord syndrome at C3 level of cervical spinal cord, initial encounter: Secondary | ICD-10-CM | POA: Diagnosis not present

## 2015-06-05 DIAGNOSIS — Z6828 Body mass index (BMI) 28.0-28.9, adult: Secondary | ICD-10-CM | POA: Diagnosis not present

## 2015-06-05 DIAGNOSIS — Z8673 Personal history of transient ischemic attack (TIA), and cerebral infarction without residual deficits: Secondary | ICD-10-CM | POA: Diagnosis not present

## 2015-06-05 DIAGNOSIS — Z8711 Personal history of peptic ulcer disease: Secondary | ICD-10-CM | POA: Diagnosis not present

## 2015-06-05 DIAGNOSIS — J432 Centrilobular emphysema: Secondary | ICD-10-CM | POA: Diagnosis not present

## 2015-06-05 DIAGNOSIS — Z86718 Personal history of other venous thrombosis and embolism: Secondary | ICD-10-CM | POA: Diagnosis not present

## 2015-06-05 DIAGNOSIS — S14123S Central cord syndrome at C3 level of cervical spinal cord, sequela: Secondary | ICD-10-CM | POA: Diagnosis not present

## 2015-06-05 DIAGNOSIS — E785 Hyperlipidemia, unspecified: Secondary | ICD-10-CM | POA: Diagnosis present

## 2015-06-05 LAB — BRAIN NATRIURETIC PEPTIDE: B NATRIURETIC PEPTIDE 5: 491.7 pg/mL — AB (ref 0.0–100.0)

## 2015-06-05 LAB — BASIC METABOLIC PANEL
ANION GAP: 8 (ref 5–15)
BUN: 28 mg/dL — ABNORMAL HIGH (ref 6–20)
CALCIUM: 8.4 mg/dL — AB (ref 8.9–10.3)
CO2: 28 mmol/L (ref 22–32)
Chloride: 105 mmol/L (ref 101–111)
Creatinine, Ser: 1.08 mg/dL (ref 0.61–1.24)
Glucose, Bld: 93 mg/dL (ref 65–99)
Potassium: 3.2 mmol/L — ABNORMAL LOW (ref 3.5–5.1)
Sodium: 141 mmol/L (ref 135–145)

## 2015-06-05 LAB — PROCALCITONIN: PROCALCITONIN: 0.12 ng/mL

## 2015-06-05 LAB — POCT I-STAT 3, ART BLOOD GAS (G3+)
ACID-BASE EXCESS: 3 mmol/L — AB (ref 0.0–2.0)
Bicarbonate: 26 mEq/L — ABNORMAL HIGH (ref 20.0–24.0)
O2 SAT: 93 %
PCO2 ART: 34 mmHg — AB (ref 35.0–45.0)
Patient temperature: 98.6
TCO2: 27 mmol/L (ref 0–100)
pH, Arterial: 7.492 — ABNORMAL HIGH (ref 7.350–7.450)
pO2, Arterial: 61 mmHg — ABNORMAL LOW (ref 80.0–100.0)

## 2015-06-05 LAB — CBC
HEMATOCRIT: 25.6 % — AB (ref 39.0–52.0)
Hemoglobin: 7.9 g/dL — ABNORMAL LOW (ref 13.0–17.0)
MCH: 30.2 pg (ref 26.0–34.0)
MCHC: 30.9 g/dL (ref 30.0–36.0)
MCV: 97.7 fL (ref 78.0–100.0)
PLATELETS: 288 10*3/uL (ref 150–400)
RBC: 2.62 MIL/uL — AB (ref 4.22–5.81)
RDW: 15.8 % — AB (ref 11.5–15.5)
WBC: 5.6 10*3/uL (ref 4.0–10.5)

## 2015-06-05 LAB — GLUCOSE, CAPILLARY
GLUCOSE-CAPILLARY: 90 mg/dL (ref 65–99)
Glucose-Capillary: 123 mg/dL — ABNORMAL HIGH (ref 65–99)
Glucose-Capillary: 87 mg/dL (ref 65–99)
Glucose-Capillary: 136 mg/dL — ABNORMAL HIGH (ref 65–99)

## 2015-06-05 LAB — MRSA PCR SCREENING: MRSA BY PCR: NEGATIVE

## 2015-06-05 LAB — HEPARIN LEVEL (UNFRACTIONATED): Heparin Unfractionated: 0.87 IU/mL — ABNORMAL HIGH (ref 0.30–0.70)

## 2015-06-05 LAB — APTT: aPTT: 106 seconds — ABNORMAL HIGH (ref 24–37)

## 2015-06-05 MED ORDER — POTASSIUM CHLORIDE CRYS ER 20 MEQ PO TBCR
40.0000 meq | EXTENDED_RELEASE_TABLET | Freq: Once | ORAL | Status: AC
  Administered 2015-06-05: 40 meq via ORAL
  Filled 2015-06-05: qty 2

## 2015-06-05 MED ORDER — ACETAMINOPHEN 500 MG PO TABS
1000.0000 mg | ORAL_TABLET | Freq: Three times a day (TID) | ORAL | Status: DC
  Administered 2015-06-05 – 2015-06-07 (×7): 1000 mg via ORAL
  Filled 2015-06-05 (×8): qty 2

## 2015-06-05 MED ORDER — ALBUTEROL SULFATE (2.5 MG/3ML) 0.083% IN NEBU
2.5000 mg | INHALATION_SOLUTION | RESPIRATORY_TRACT | Status: DC | PRN
  Administered 2015-06-09: 2.5 mg via RESPIRATORY_TRACT
  Filled 2015-06-05: qty 3

## 2015-06-05 MED ORDER — CETYLPYRIDINIUM CHLORIDE 0.05 % MT LIQD
7.0000 mL | Freq: Two times a day (BID) | OROMUCOSAL | Status: DC
  Administered 2015-06-05 – 2015-06-10 (×10): 7 mL via OROMUCOSAL

## 2015-06-05 MED ORDER — OXYCODONE HCL 5 MG PO TABS
5.0000 mg | ORAL_TABLET | Freq: Four times a day (QID) | ORAL | Status: DC | PRN
  Administered 2015-06-05 – 2015-06-10 (×9): 5 mg via ORAL
  Filled 2015-06-05 (×10): qty 1

## 2015-06-05 MED ORDER — METOPROLOL TARTRATE 50 MG PO TABS: 50.0000 mg | ORAL_TABLET | Freq: Two times a day (BID) | ORAL | Status: DC

## 2015-06-05 MED ORDER — FUROSEMIDE 10 MG/ML IJ SOLN
40.0000 mg | Freq: Once | INTRAMUSCULAR | Status: AC
  Administered 2015-06-05: 40 mg via INTRAVENOUS
  Filled 2015-06-05: qty 4

## 2015-06-05 MED ORDER — HEPARIN (PORCINE) IN NACL 100-0.45 UNIT/ML-% IJ SOLN
1150.0000 [IU]/h | INTRAMUSCULAR | Status: DC
  Administered 2015-06-05: 1250 [IU]/h via INTRAVENOUS
  Administered 2015-06-08: 1100 [IU]/h via INTRAVENOUS
  Filled 2015-06-05 (×3): qty 250

## 2015-06-05 MED ORDER — IPRATROPIUM-ALBUTEROL 0.5-2.5 (3) MG/3ML IN SOLN
3.0000 mL | Freq: Three times a day (TID) | RESPIRATORY_TRACT | Status: DC
  Administered 2015-06-05 – 2015-06-09 (×11): 3 mL via RESPIRATORY_TRACT
  Filled 2015-06-05 (×12): qty 3

## 2015-06-05 MED ORDER — RESOURCE THICKENUP CLEAR PO POWD
ORAL | Status: DC | PRN
  Filled 2015-06-05 (×2): qty 125

## 2015-06-05 NOTE — Progress Notes (Signed)
Made Dr Jerral RalphGhimire aware of Bladder scan >600 cc. Waiting to hear back from dr. No distention noted.

## 2015-06-05 NOTE — Progress Notes (Signed)
PCCM Attending Note:  Contacted by hospitalist in patient on systemic anticoagulation with Xarelto. Patient admitted with elevated BNP & bilateral pleural effusion. Reviewed imaging which appears consistent with CHF exacerbation. Patient tolerated gentle diuresis yesterday with persistent fluid in her right fissure. Recommended switching to a heparin gtt for anticoagulation given recent DVT & further diuresis. Dr. Jerral RalphGhimire will contact me if there are any further concerns or he desires a formal consult.  Donna ChristenJennings E. Jamison NeighborNestor, M.D. [facility] Pager:  [phone] After 3pm or if no response, call [phone]

## 2015-06-05 NOTE — Progress Notes (Signed)
ANTICOAGULATION CONSULT NOTE - Initial Consult  Pharmacy Consult for Heparin Indication: DVT  No Known Allergies  Patient Measurements: Weight: 197 lb (89.359 kg) Heparin Dosing Weight:   Vital Signs: Temp: 98 F (36.7 C) (11/10 0601) Temp Source: Oral (11/10 0601) BP: 106/43 mmHg (11/10 1046) Pulse Rate: 72 (11/10 1047)  Labs:  Recent Labs  06/03/15 1511 06/04/15 0632 06/05/15 0814  HGB 8.4* 8.6* 7.9*  HCT 25.9* 28.5* 25.6*  PLT 327 356 288  LABPROT  --  32.6*  --   INR  --  3.26*  --   CREATININE 0.97 1.12 1.08  TROPONINI  --  <0.03  --     Estimated Creatinine Clearance: 65.7 mL/min (by C-G formula based on Cr of 1.08).   Medical History: Past Medical History  Diagnosis Date  . Carotid artery occlusion   . Hypertension   . Hyperlipidemia   . Arthritis   . Stroke [facility](HCC) 1998 and  1999  . Ulcer     Hiatal Hernia / Gastric ulcers  . History of vagotomy   . Chronic diarrhea   . Fall against object Oct. 16, 2015    Pt got tangled up with dog chain out side in the woods.  . Diabetes mellitus 2006    TYPE 2  . Hemoptysis 06/04/2015    Medications:  Scheduled:  . acetaminophen  1,000 mg Oral TID  . antiseptic oral rinse  7 mL Mouth Rinse BID  . divalproex  125 mg Oral Daily   And  . divalproex  250 mg Oral Q2000  . feeding supplement (ENSURE ENLIVE)  237 mL Oral BID BM  . feeding supplement (PRO-STAT SUGAR FREE 64)  30 mL Oral BID  . fentaNYL  25 mcg Transdermal Q72H  . ipratropium-albuterol  3 mL Nebulization TID  . latanoprost  1 drop Right Eye QHS  . multivitamin with minerals  1 tablet Oral Daily  . pantoprazole  40 mg Oral Daily  . polyethylene glycol  17 g Oral Daily  . pravastatin  40 mg Oral Daily  . senna  1 tablet Oral BID  . sodium chloride  3 mL Intravenous Q12H  . thiamine  100 mg Oral Daily    Assessment: 79yo male with recently diagnosed RUE DVT from PICC line from completing Vancomycin x 4wks on 11/2.  He was admitted on 11/9  with AMS and hemoptysis.  Pt was on Xarelto 15mg  bid since 10/25; his last dose was on 11/8 PM.    Hemoptysis is now resolved and pt is to start Heparin.  Hg 7.9 this AM (8.4 on admission) and pltc 288 (327 on admit).  No bleeding noted.  We will need to follow aPTT and Heparin level until Xarelto effect on Heparin level wears off.  Goal of Therapy:  Heparin level 0.3-0.7 units/ml aPTT 66-102sec seconds Monitor platelets by anticoagulation protocol: Yes   Plan:  Heparin 1250 units/hr, no bolus Check Heparin level and aPTT in 8hr Daily HL, aPTT, CBC Watch for s/s of bleeding D/C daily aPTT once HL correlates Aim for lower end of target ranges  Marisue HumbleKendra Kenn Rekowski, PharmD Clinical Pharmacist [facility]

## 2015-06-05 NOTE — Progress Notes (Signed)
[facility] potential [facility] back to [facility] tomorrow or over the weekend.  CSW will continue to follow  Merlyn LotJenna Holoman, [facility] Clinical Social Worker [phone]

## 2015-06-05 NOTE — NC FL2 (Signed)
[place] MEDICAID FL2 LEVEL OF CARE SCREENING TOOL     IDENTIFICATION  Patient Name: Charles Bridges Birthdate: 12/02/1935 Sex: male Admission Date (Current Location): 06/04/2015  [facility] and [place] Number: Producer, television/film/video and Address:  [facility], [address], [place], [place] [address]      Provider Number: [id number]  Attending Physician Name and Address:  Maretta Bees, MD  Relative Name and Phone Number:       Current Level of Care: Hospital Recommended Level of Care: Skilled Nursing Facility Prior Approval Number:    Date Approved/Denied:   PASRR Number:    Discharge Plan: SNF    Current Diagnoses: Patient Active Problem List   Diagnosis Date Noted  . Hemoptysis 06/04/2015  . Diabetes mellitus, type 2 (HCC) 06/04/2015  . COPD (chronic obstructive pulmonary disease) (HCC) 06/04/2015  . Acute respiratory failure (HCC) 06/04/2015  . Urinary tract infectious disease   . Pressure ulcer 05/01/2015  . Coagulase negative Staphylococcus bacteremia 05/01/2015  . Gram-positive cocci bacteremia 04/30/2015  . Bacteremia 04/29/2015  . AKI (acute kidney injury) (HCC) 04/28/2015  . Chronic indwelling Foley catheter 04/28/2015  . UTI (urinary tract infection) 04/28/2015  . Central cord syndrome at C3 level of cervical spinal cord (HCC) 04/28/2015  . Left-sided weakness 04/28/2015  . Anemia 04/28/2015  . Altered mental status 04/27/2015  . Acute encephalopathy 04/27/2015  . Acute respiratory failure, unspecified whether with hypoxia or hypercapnia (HCC)   . Aspiration pneumonia (HCC)   . Cervical spinal cord injury without evidence of spinal bone injury (HCC) 04/15/2015  . Essential hypertension 04/13/2015  . HLD (hyperlipidemia) 04/13/2015  . Sensory loss 04/13/2015  . Rib fracture 04/13/2015  . History of CVA (cerebrovascular accident) 04/13/2015  . Falls 04/13/2015  . Alcohol abuse 04/13/2015  . Hyponatremia 04/13/2015  . TIA  (transient ischemic attack) 04/13/2015  . Carotid stenosis 05/22/2014  . Aftercare following surgery of the circulatory system 05/22/2014  . Occlusion and stenosis of carotid artery without mention of cerebral infarction 03/07/2012    Orientation ACTIVITIES/SOCIAL BLADDER RESPIRATION    Self    Incontinent O2 (As needed) (4L)  BEHAVIORAL SYMPTOMS/MOOD NEUROLOGICAL BOWEL NUTRITION STATUS      Continent Diet (see discharge summary)  PHYSICIAN VISITS COMMUNICATION OF NEEDS Height & Weight Skin    Verbally   197 lbs. PU Stage and Appropriate Care PU Stage 1 Dressing:  (foam dressing) PU Stage 2 Dressing:  (foam dressing)      AMBULATORY STATUS RESPIRATION    Assist extensive O2 (As needed) (4L)      Personal Care Assistance Level of Assistance  Bathing, Feeding, Dressing Bathing Assistance: Maximum assistance Feeding assistance: Maximum assistance Dressing Assistance: Maximum assistance      Functional Limitations Info  Sight, Hearing Sight Info: Impaired Hearing Info: Impaired         SPECIAL CARE FACTORS FREQUENCY                      Additional Factors Info  Code Status, Allergies, Insulin Sliding Scale Code Status Info: Partial ( (no mechanical ventilation)) Allergies Info: NKA   Insulin Sliding Scale Info: 4x day       Current Medications (06/05/2015): Current Facility-Administered Medications  Medication Dose Route Frequency Provider Last Rate Last Dose  . acetaminophen (TYLENOL) tablet 1,000 mg  1,000 mg Oral TID Maretta Bees, MD      . albuterol (PROVENTIL) (2.5 MG/3ML) 0.083% nebulizer solution  2.5 mg  2.5 mg Nebulization Q2H PRN Maretta Bees, MD      . antiseptic oral rinse (CPC / CETYLPYRIDINIUM CHLORIDE 0.05%) solution 7 mL  7 mL Mouth Rinse BID Ozella Rocks, MD   7 mL at 06/05/15 1000  . divalproex (DEPAKOTE SPRINKLE) capsule 125 mg  125 mg Oral Daily Ozella Rocks, MD   125 mg at 06/05/15 1012   And  . divalproex (DEPAKOTE  SPRINKLE) capsule 250 mg  250 mg Oral Q2000 Ozella Rocks, MD   250 mg at 06/04/15 2213  . feeding supplement (ENSURE ENLIVE) (ENSURE ENLIVE) liquid 237 mL  237 mL Oral BID BM Ozella Rocks, MD   237 mL at 06/05/15 1011  . feeding supplement (PRO-STAT SUGAR FREE 64) liquid 30 mL  30 mL Oral BID Meredith Pel, NP   30 mL at 06/05/15 1011  . fentaNYL (DURAGESIC - dosed mcg/hr) patch 25 mcg  25 mcg Transdermal Q72H Meredith Pel, NP   25 mcg at 06/04/15 1200  . ipratropium-albuterol (DUONEB) 0.5-2.5 (3) MG/3ML nebulizer solution 3 mL  3 mL Nebulization TID Shanker Levora Dredge, MD      . latanoprost (XALATAN) 0.005 % ophthalmic solution 1 drop  1 drop Right Eye QHS Meredith Pel, NP   1 drop at 06/05/15 0036  . multivitamin with minerals tablet 1 tablet  1 tablet Oral Daily Jenifer A Mayford Knife, RD   1 tablet at 06/05/15 1011  . ondansetron (ZOFRAN) tablet 4 mg  4 mg Oral Q6H PRN Meredith Pel, NP       Or  . ondansetron [facility]) injection 4 mg  4 mg Intravenous Q6H PRN Meredith Pel, NP      . oxyCODONE (Oxy IR/ROXICODONE) immediate release tablet 5 mg  5 mg Oral Q6H PRN Maretta Bees, MD      . pantoprazole (PROTONIX) EC tablet 40 mg  40 mg Oral Daily Meredith Pel, NP   40 mg at 06/05/15 1012  . polyethylene glycol (MIRALAX / GLYCOLAX) packet 17 g  17 g Oral Daily Meredith Pel, NP   17 g at 06/05/15 1013  . pravastatin (PRAVACHOL) tablet 40 mg  40 mg Oral Daily Meredith Pel, NP   40 mg at 06/05/15 1012  . RESOURCE THICKENUP CLEAR   Oral PRN Maretta Bees, MD      . [facility]) tablet 8.6 mg  1 tablet Oral BID Meredith Pel, NP   8.6 mg at 06/05/15 1012  . sodium chloride 0.9 % injection 3 mL  3 mL Intravenous Q12H Meredith Pel, NP   3 mL at 06/04/15 1000  . thiamine (VITAMIN B-1) tablet 100 mg  100 mg Oral Daily Meredith Pel, NP   100 mg at 06/05/15 1012   Do not use this list as official medication orders. Please verify with discharge  summary.  Discharge Medications:   Medication List    ASK your doctor about these medications        acetaminophen 325 MG tablet  Commonly known as:  TYLENOL  Take 650 mg by mouth every 4 (four) hours as needed for mild pain.     aspirin 81 MG chewable tablet  Chew 1 tablet (81 mg total) by mouth daily.     cefTRIAXone 1 G injection  Commonly known as:  ROCEPHIN  Inject 1 g into the muscle daily.     divalproex 125 MG capsule  Commonly known as:  DEPAKOTE SPRINKLE  Take 125-250 mg by mouth 2 (two) times daily. Take 1 capsule every morning and take 2 capsules every evening     feeding supplement (PRO-STAT SUGAR FREE 64) Liqd  Take 30 mLs by mouth 2 (two) times daily.     fentaNYL 25 MCG/HR patch  Commonly known as:  DURAGESIC - dosed mcg/hr  Place 25 mcg onto the skin every 3 (three) days.     folic acid 1 MG tablet  Commonly known as:  FOLVITE  Take 1 tablet (1 mg total) by mouth daily.     HYDROcodone-acetaminophen 5-325 MG tablet  Commonly known as:  NORCO/VICODIN  Take 1 tablet by mouth every 6 (six) hours as needed for severe pain.     insulin aspart 100 UNIT/ML injection  Commonly known as:  novoLOG  Inject 3 Units into the skin See admin instructions. Every morning if CBG above 250     latanoprost 0.005 % ophthalmic solution  Commonly known as:  XALATAN  Place 1 drop into the right eye at bedtime.     lisinopril-hydrochlorothiazide 20-12.5 MG tablet  Commonly known as:  PRINZIDE,ZESTORETIC  Take 1 tablet by mouth daily.     metoprolol 50 MG tablet  Commonly known as:  LOPRESSOR  Take 1 tablet (50 mg total) by mouth 2 (two) times daily.     multivitamin with minerals Tabs tablet  Take 1 tablet by mouth daily.     pantoprazole 40 MG tablet  Commonly known as:  PROTONIX  Take 1 tablet (40 mg total) by mouth daily.     polyethylene glycol packet  Commonly known as:  MIRALAX / GLYCOLAX  Take 17 g by mouth daily.     pravastatin 40 MG tablet  Commonly  known as:  PRAVACHOL  Take 40 mg by mouth daily.     senna 8.6 MG Tabs tablet  Commonly known as:  SENOKOT  Take 1 tablet (8.6 mg total) by mouth 2 (two) times daily.     thiamine 100 MG tablet  Take 1 tablet (100 mg total) by mouth daily.     XARELTO STARTER PACK 15 & 20 MG Tbpk  Generic drug:  Rivaroxaban  Take 15-20 mg by mouth as directed. Take as directed on package: Start with one 15mg  tablet by mouth twice a day with food. On Day 22, switch to one 20mg  tablet once a day with food.        Relevant Imaging Results:  Relevant Lab Results:  Recent Labs    Additional Information    Izora RibasHoloman, Laneah Luft M, LCSW

## 2015-06-05 NOTE — Progress Notes (Signed)
ANTICOAGULATION CONSULT NOTE  Pharmacy Consult for Heparin Indication: DVT  No Known Allergies  Patient Measurements: Height: 6'1.5" Weight: 197 lb (89.359 kg) Heparin Dosing Weight: 89kg  Vital Signs: Temp: 98.7 F (37.1 C) (11/10 2128) Temp Source: Oral (11/10 2128) BP: 112/40 mmHg (11/10 2128) Pulse Rate: 66 (11/10 2128)  Labs:  Recent Labs  06/03/15 1511 06/04/15 0632 06/05/15 0814 06/05/15 2103  HGB 8.4* 8.6* 7.9*  --   HCT 25.9* 28.5* 25.6*  --   PLT 327 356 288  --   APTT  --   --   --  106*  LABPROT  --  32.6*  --   --   INR  --  3.26*  --   --   HEPARINUNFRC  --   --   --  0.87*  CREATININE 0.97 1.12 1.08  --   TROPONINI  --  <0.03  --   --     Estimated Creatinine Clearance: 65.7 mL/min (by C-G formula based on Cr of 1.08).  Assessment: 79yo male with recently diagnosed RUE DVT from PICC line from completing Vancomycin x 4wks on 11/2.  He was admitted on 11/9 with AMS and hemoptysis.  Pt was on Xarelto 15mg  bid since 10/25- his last dose was on 11/8 PM.    Hemoptysis is now resolved and started on Heparin.  First HL 0.87 (affected by Xarelto), and aPTT 106 seconds- both slightly elevated. No bleeding noted.  Goal of Therapy:  Heparin level 0.3-0.7 units/ml aPTT 66-102sec seconds Monitor platelets by anticoagulation protocol: Yes   Plan:  -decrease heparin to 1150 units/hr -Check Heparin level and aPTT in 8hr- with AM labs -Daily HL and aPTT until correlating -Daily CBC- follow for s/s bleeding  Darvin Dials D. Lindaann Gradilla, PharmD, BCPS Clinical Pharmacist Pager: [phone] 06/05/2015 9:59 PM

## 2015-06-05 NOTE — Progress Notes (Addendum)
PATIENT DETAILS Name: Charles Bridges Age: 79 y.o. Sex: male Date of Birth: [birth year] Admit Date: 06/04/2015 Admitting Physician Waldemar Dickens, MD UPJ:SRPRXYV,OPFYT L, MD   Brief narrative 79 year old male with history of prior CVA, history of carotid artery occlusion status post CEA, EtOH use, recent C4-5 fracture with cord edema-and resultant central cord syndrome (September 2016), recent history of coagulase neg staph bacteremia on IV vancomycin, history of right upper extremity DVT (likely from PICC) on anticoagulation-presented to the hospital for evaluation of acute hypoxic respiratory failure and hemoptysis.  Subjective: Per RN-no further hemoptysis-patient only mildly confused-but easily redirected and then answers questions appropriately.  Assessment/Plan: Active Problems: Acute hypoxic respiratory failure: Suspect acute diastolic heart failure, afebrile without leukocytosis-on IV vancomycin prior to this admission-doubt HCAP/Aspiration. Spoke with PCCM-Dr. Francesca Oman reviewed chart and radiological studies-and thought that this was most likely CHF. Still hypoxic, will give 1 additional dose of intravenous Lasix and repeat chest x-ray x-ray in a.m. Continue to monitor off IV antibiotics for now. If bilateral infiltrates persists-Will formally consult PCCM.  Suspected acute diastolic heart failure: One additional dose of Lasix-stop IV fluids-continue to keep a negative balance, and repeat chest x-ray in a.m. Check weight, strict intake/output, keep in neg balance  Hemoptysis: Secondary to above-none since admission-after discussion with PCCM on the phone-we will start IV heparin-as patient has recent history of right upper extremity DVT to the subclavian vein.  Recent history of coagulase negative bacteremia: ID consulted-recommendations are to continue to monitor off antibiotics. PICC line has been removed, await catheter tip culture and blood cultures. Afebrile  and without leukocytosis.  Right upper extremity DVT: Secondary to PICC line, on Xarelto prior to this admission-starting heparin-see above  Acute on chronic encephalopathy: Suspect some amount of alcohol related encephalopathy at baseline-suspect worsened by acute illness/narcotics/hypoxia. Easily redirectable-answers most questions appropriately. Continue with Depakote and Supportive care.  Anemia: Suspect secondary to acute illness with anemia from chronic disease at baseline-doubt from hemoptysis-cautiously start heparin-and follow CBCs. No further hemoptysis since admission-per RN stool is brown in color.  Central cord syndrome:-Following a mechanical fall and resultant C4-5 fracture with cord edema C3 -Patient S/p C3,4,5,and C6 laminectomies, Posterior arthrodesis C3-6, morselized allograft by Dr. Cyndy Freeze  on 04/15/2015. His right upper extremity around 3-4/5, left upper extremity around 1-2/5, bilateral lower extremities around 3/5.  Chronic indwelling Foley catheter: Removed-voiding trial in progress  Essential hypertension: Stop all antihypertensives-BP soft-on IV Lasix-resume antihypertensives when able  History of CVA/carotid artery disease-status post carotid endarterectomy: Not on aspirin-as an anticoagulation, has significant deficits as outlined above due to central cord syndrome following a mechanical fall   ? Prior history of atrial fibrillation: On IV heparin-telemetry shows sinus rhythm-hold metoprolol for now  Dyslipidemia: Continue statin  History of peptic ulcer disease: Continue with PPI, is s/p vagotomy.  History of alcohol abuse: Resident of SNF since his discharge September this year-doubt any ongoing alcohol use  Bilateral lower extremity/heel pressure ulcers: Seen by wound care RN-recommendations are foam dressing to affected areas, continue Prevalon boots  ? History of WK:MQKM SSI-check A1c.  Left lung base nodule: Given risk factors for bronchogenic carcinoma,  follow-up chest CT at 1 year is recommended-defer to outpatient setting  Disposition: Remain inpatient  Antimicrobial agents  See below  Anti-infectives    None      DVT Prophylaxis: Heparin gtt  Code Status: Partial Code  Family Communication None at baseline  Procedures: None  CONSULTS:  None  Time spent 30 minutes-Greater than 50% of this time was spent in counseling, explanation of diagnosis, planning of further management, and coordination of care.  MEDICATIONS: Scheduled Meds: . antiseptic oral rinse  7 mL Mouth Rinse BID  . divalproex  125 mg Oral Daily   And  . divalproex  250 mg Oral Q2000  . feeding supplement (ENSURE ENLIVE)  237 mL Oral BID BM  . feeding supplement (PRO-STAT SUGAR FREE 64)  30 mL Oral BID  . fentaNYL  25 mcg Transdermal Q72H  . furosemide  40 mg Intravenous Once  . insulin aspart  0-5 Units Subcutaneous QHS  . insulin aspart  0-9 Units Subcutaneous TID WC  . latanoprost  1 drop Right Eye QHS  . multivitamin with minerals  1 tablet Oral Daily  . pantoprazole  40 mg Oral Daily  . polyethylene glycol  17 g Oral Daily  . pravastatin  40 mg Oral Daily  . senna  1 tablet Oral BID  . sodium chloride  3 mL Intravenous Q12H  . thiamine  100 mg Oral Daily   Continuous Infusions:  PRN Meds:.ondansetron **OR** ondansetron (ZOFRAN) IV, RESOURCE THICKENUP CLEAR    PHYSICAL EXAM: Vital signs in last 24 hours: Filed Vitals:   06/04/15 1357 06/04/15 2212 06/05/15 0601 06/05/15 0922  BP: 129/48 121/42 115/40   Pulse: 134 59 62   Temp:  97.4 F (36.3 C) 98 F (36.7 C)   TempSrc:  Oral Oral   Resp: _0 Weight:    89.359 kg (197 lb)  SpO2: 85% 93% 96%     Weight change:  Filed Weights   06/05/15 0922  Weight: 89.359 kg (197 lb)   Body mass index is 25.64 kg/(m^2).   Gen Exam: Awake and alert with clear speech.  Neck: Supple, No JVD.   Chest: B/L Clear.   CVS: S1 S2 Regular, no murmurs.  Abdomen: soft, BS +, non tender,  non distended.  Extremities: no edema, lower extremities warm to touch. Neurologic: see above Skin: No Rash.    Intake/Output from previous day:  Intake/Output Summary (Last 24 hours) at 06/05/15 1040 Last data filed at 06/05/15 0923  Gross per 24 hour  Intake 1214.5 ml  Output   2000 ml  Net -785.5 ml     LAB RESULTS: CBC  Recent Labs Lab 06/03/15 1511 06/04/15 0632 06/05/15 0814  WBC 6.6 7.8 5.6  HGB 8.4* 8.6* 7.9*  HCT 25.9* 28.5* 25.6*  PLT 327 356 288  MCV 93.8 96.6 97.7  MCH 30.4 29.2 30.2  MCHC 32.4 30.2 30.9  RDW 15.4 15.6* 15.8*  LYMPHSABS 0.5* 0.6*  --   MONOABS 0.5 0.5  --   EOSABS 0.4 0.3  --   BASOSABS 0.0 0.0  --     Chemistries   Recent Labs Lab 06/03/15 1511 06/04/15 0632 06/05/15 0814  NA 142 142 141  K 3.5 3.4* 3.2*  CL 104 106 105  CO2 _1 GLUCOSE 119* 122* 93  BUN 26* 25* 28*  CREATININE 0.97 1.12 1.08  CALCIUM 8.1* 8.7* 8.4*    CBG:  Recent Labs Lab 06/04/15 1207 06/04/15 1849 06/04/15 2209 06/05/15 0756  GLUCAP 101* 98 89 90    GFR Estimated Creatinine Clearance: 65.7 mL/min (by C-G formula based on Cr of 1.08).  Coagulation profile  Recent Labs Lab 06/04/15 0632  INR 3.26*    Cardiac Enzymes  Recent Labs  Lab 06/04/15 0632  TROPONINI <0.03    Invalid input(s): POCBNP No results for input(s): DDIMER in the last 72 hours. No results for input(s): HGBA1C in the last 72 hours. No results for input(s): CHOL, HDL, LDLCALC, TRIG, CHOLHDL, LDLDIRECT in the last 72 hours. No results for input(s): TSH, T4TOTAL, T3FREE, THYROIDAB in the last 72 hours.  Invalid input(s): FREET3 No results for input(s): VITAMINB12, FOLATE, FERRITIN, TIBC, IRON, RETICCTPCT in the last 72 hours. No results for input(s): LIPASE, AMYLASE in the last 72 hours.  Urine Studies No results for input(s): UHGB, CRYS in the last 72 hours.  Invalid input(s): UACOL, UAPR, USPG, UPH, UTP, UGL, UKET, UBIL, UNIT, UROB, ULEU, UEPI, UWBC,  URBC, UBAC, CAST, UCOM, BILUA  MICROBIOLOGY: Recent Results (from the past 240 hour(s))  Blood culture (routine single)     Status: None (Preliminary result)   Collection Time: 06/03/15  2:55 PM  Result Value Ref Range Status   Preliminary Report Blood Culture received; No Growth to date;  Preliminary   Preliminary Report Culture will be held for 5 days before issuing  Preliminary   Preliminary Report a Final Negative report.  Preliminary  Blood culture (routine single)     Status: None (Preliminary result)   Collection Time: 06/03/15  3:09 PM  Result Value Ref Range Status   Preliminary Report Blood Culture received; No Growth to date;  Preliminary   Preliminary Report Culture will be held for 5 days before issuing  Preliminary    Comment: Culture results may be compromised due to an excessive volume of blood received in culture bottles.    Preliminary Report a Final Negative report.  Preliminary  Culture, blood (routine x 2)     Status: None (Preliminary result)   Collection Time: 06/04/15  6:14 AM  Result Value Ref Range Status   Specimen Description BLOOD RIGHT ARM  Final   Special Requests BOTTLES DRAWN AEROBIC ONLY 10CC  Final   Culture PENDING  Incomplete   Report Status PENDING  Incomplete  Urine culture     Status: None (Preliminary result)   Collection Time: 06/04/15  6:30 AM  Result Value Ref Range Status   Specimen Description URINE, RANDOM  Final   Special Requests NONE  Final   Culture NO GROWTH < 12 HOURS  Final   Report Status PENDING  Incomplete  Cath Tip Culture     Status: None (Preliminary result)   Collection Time: 06/04/15 11:15 AM  Result Value Ref Range Status   Specimen Description CATH TIP  Final   Special Requests L PICC TIP  Final   Culture NO GROWTH Performed at [facility]   Final   Report Status PENDING  Incomplete  MRSA PCR Screening     Status: None   Collection Time: 06/04/15 10:18 PM  Result Value Ref Range Status   MRSA by PCR  NEGATIVE NEGATIVE Final    Comment:        The GeneXpert MRSA Assay (FDA approved for NASAL specimens only), is one component of a comprehensive MRSA colonization surveillance program. It is not intended to diagnose MRSA infection nor to guide or monitor treatment for MRSA infections.     RADIOLOGY STUDIES/RESULTS: Ct Angio Chest Pe W/cm &/or Wo Cm  06/04/2015  CLINICAL DATA:  Hemoptysis. Hypertension. Hyperlipidemia. Prior CVA. Ex-smoker. EXAM: CT ANGIOGRAPHY CHEST WITH CONTRAST TECHNIQUE: Multidetector CT imaging of the chest was performed using the standard protocol during bolus administration of intravenous contrast. Multiplanar CT image  reconstructions and MIPs were obtained to evaluate the vascular anatomy. CONTRAST:  105m OMNIPAQUE IOHEXOL 350 MG/ML SOLN COMPARISON:  Outside CT dated 04/13/2015. Prior report not submitted. Chest radiograph of 04/27/2015. FINDINGS: Mediastinum/Nodes: Motion and patient position degraded exam. Arms not raised above the head. No pulmonary embolism to the lobar to large segmental level. A left-sided PICC line which terminates at the left brachiocephalic vein. Aortic and branch vessel atherosclerosis. Mild cardiomegaly. Multivessel coronary artery atherosclerosis. Right paratracheal adenopathy at 1.3 cm (image 36, series 4). This is new since 01/07/2010. Prevascular nodes measure up to the 1.3 cm. Lungs/Pleura: Small to moderate bilateral pleural effusions. Moderate centrilobular emphysema. A 3 mm left lower lobe pulmonary nodule (image 50 for, series 6). Absent in 2011. Apices minimally excluded. Dependent upper and less so lower lobe patchy airspace and interstitial opacities. Upper abdomen: Normal imaged portions of the liver, spleen, pancreas, adrenal glands. gastric diverticulum, (image 92, series 4). Musculoskeletal: Remote left rib trauma. Suspect ankylosing spondylitis throughout the thoracic spine. Review of the MIP images confirms the above findings.  IMPRESSION: 1. Motion and position degraded exam. No pulmonary embolism to the large segmental level. 2. Bilateral pleural effusions with bilateral dependent patchy interstitial and airspace opacities. Considerations include pulmonary edema superimposed upon emphysema versus multifocal infection. 3. Adenopathy which could be reactive. Consider followup with chest CT at 3 months to confirm resolution. 4.  Atherosclerosis, including within the coronary arteries. 5. Gastric diverticulum. 6. Probable ankylosing spondylitis. 7. Left lung base nodule. Given risk factors for bronchogenic carcinoma, follow-up chest CT at 1 year is recommended. This recommendation follows the consensus statement: "Guidelines for Management of Small Pulmonary Nodules Detected on CT Scans: A Statement from the [employer] as published in Radiology 2005; 237:395-400. Available online at: [email or web address].com/ 8. Left-sided PICC line terminating at the left brachiocephalic vein. Electronically Signed   By: KAbigail MiyamotoM.D.   On: 06/04/2015 07:53   Ir [place] Vasc Access Left  05/23/2015  INDICATION: Bacteremia and previous right PICC line malfunctioning. Request for new left arm peripherally inserted central catheter for antibiotics. EXAM: ULTRASOUND AND FLUOROSCOPIC GUIDED PICC LINE INSERTION MEDICATIONS: None. CONTRAST:  None FLUOROSCOPY TIME:  18 seconds. COMPLICATIONS: None immediate TECHNIQUE: The procedure, risks, benefits, and alternatives were explained to the patient's wife and informed written consent was obtained. A timeout was performed prior to the initiation of the procedure. The left upper extremity was prepped with chlorhexidine in a sterile fashion, and a sterile drape was applied covering the operative field. Maximum barrier sterile technique with sterile gowns and gloves were used for the procedure. A timeout was performed prior to the initiation of the procedure. Local  anesthesia was provided with 1% lidocaine. Under direct ultrasound guidance, the left brachial vein was accessed with a micropuncture kit after the overlying soft tissues were anesthetized with 1% lidocaine. An ultrasound image was saved for documentation purposes. A guidewire was advanced to the level of the superior caval-atrial junction for measurement purposes and the PICC line was cut to length. A peel-away sheath was placed and a 44 cm, 5 [place] single lumen was inserted to level of the superior caval-atrial junction. A post procedure spot fluoroscopic was obtained. The catheter easily aspirated and flushed and was sutured in place. A dressing was placed. The patient tolerated the procedure well without immediate post procedural complication. FINDINGS: After catheter placement, the tip lies within the superior cavoatrial junction. The catheter aspirates and flushes normally and is ready for immediate use. IMPRESSION: Successful ultrasound  and fluoroscopic guided placement of a left brachial vein approach, 44 cm, 5 [place], single lumen PICC with tip at the superior caval-atrial junction. The PICC line is ready for immediate use. Read By:  Tsosie Billing PA-C Electronically Signed   By: Aletta Edouard M.D.   On: 05/23/2015 10:35   Dg Chest Port 1 View  06/05/2015  CLINICAL DATA:  Shortness of Breath EXAM: PORTABLE CHEST - 1 VIEW COMPARISON:  CT from the previous day, and earlier studies FINDINGS: coarse airspace opacities in the upper lobes left greater than right, which were present on the prior CT, not convincingly changed in distribution or severity. Heart size upper limits normal for technique. Small pleural effusions. No pneumothorax. Orthopedic hardware in the right humeral head and lower cervical spine, incompletely visualized. IMPRESSION: 1. Little change in asymmetric infiltrates or edema with upper lobe predominance, left greater than right. 2. Persistent small effusions. Electronically Signed    By: Lucrezia Europe M.D.   On: 06/05/2015 08:53   Ir Fluoro Guide Cv Midline Picc Left  05/23/2015  INDICATION: Bacteremia and previous right PICC line malfunctioning. Request for new left arm peripherally inserted central catheter for antibiotics. EXAM: ULTRASOUND AND FLUOROSCOPIC GUIDED PICC LINE INSERTION MEDICATIONS: None. CONTRAST:  None FLUOROSCOPY TIME:  18 seconds. COMPLICATIONS: None immediate TECHNIQUE: The procedure, risks, benefits, and alternatives were explained to the patient's wife and informed written consent was obtained. A timeout was performed prior to the initiation of the procedure. The left upper extremity was prepped with chlorhexidine in a sterile fashion, and a sterile drape was applied covering the operative field. Maximum barrier sterile technique with sterile gowns and gloves were used for the procedure. A timeout was performed prior to the initiation of the procedure. Local anesthesia was provided with 1% lidocaine. Under direct ultrasound guidance, the left brachial vein was accessed with a micropuncture kit after the overlying soft tissues were anesthetized with 1% lidocaine. An ultrasound image was saved for documentation purposes. A guidewire was advanced to the level of the superior caval-atrial junction for measurement purposes and the PICC line was cut to length. A peel-away sheath was placed and a 44 cm, 5 [place], single lumen was inserted to level of the superior caval-atrial junction. A post procedure spot fluoroscopic was obtained. The catheter easily aspirated and flushed and was sutured in place. A dressing was placed. The patient tolerated the procedure well without immediate post procedural complication. FINDINGS: After catheter placement, the tip lies within the superior cavoatrial junction. The catheter aspirates and flushes normally and is ready for immediate use. IMPRESSION: Successful ultrasound and fluoroscopic guided placement of a left brachial vein approach, 44  cm, 5 [place], single lumen PICC with tip at the superior caval-atrial junction. The PICC line is ready for immediate use. Read By:  Tsosie Billing PA-C Electronically Signed   By: Aletta Edouard M.D.   On: 05/23/2015 10:35    Oren Binet, MD  [facility] Pager:[phone]  If 7PM-7AM, please contact night-coverage [email or web address] Password [email or web address] 06/05/2015, 10:40 AM   LOS: 1 day

## 2015-06-05 NOTE — Progress Notes (Signed)
O2 sat on RA is 78%. On 2L, O2 sat 82%. 4L, O2 sat 92%. Dr Jerral RalphGhimire at bedside. Will maintain at 4L O2. Bed remains in lowest position and call bell within reach. Will continue to monitor pt.

## 2015-06-05 NOTE — Evaluation (Signed)
Physical Therapy Evaluation Patient Details Name: Charles Bridges MRN: [id number] DOB: 07-18-1936 Today's Date: 06/05/2015   History of Present Illness  Pt adm with acute hypoxic respiratory failure and hemoptysis. Pt with recent C4-5 fracture with resultant central cord syndrome (September 2016), Pt underwent surgical repair of fx's. PMH - CVA,ETOH, DVT  Clinical Impression  Pt admitted with above diagnosis and presents to PT with functional limitations due to deficits listed below (See PT problem list). Recommend skilled PT at SNF to continue work toward maximizing independence and function to decr burden of care.     Follow Up Recommendations SNF    Equipment Recommendations  Other (comment) (Per SNF)    Recommendations for Other Services       Precautions / Restrictions Precautions Precautions: Fall Precaution Comments: Wife states that pt no longer has to wear cervical collar. Required Braces or Orthoses:  (Wife states that pt no longer has to wear cervical collar.) Restrictions Weight Bearing Restrictions: No      Mobility  Bed Mobility Overal bed mobility: Needs Assistance Bed Mobility: Supine to Sit;Sit to Supine;Rolling Rolling: +2 for physical assistance;Total assist   Supine to sit: +2 for physical assistance;Max assist Sit to supine: +2 for physical assistance;Total assist   General bed mobility comments: Pt able to assist by helping to move legs off bed. Assist to elevate trunk into sitting and assist to bring legs back up into bed and lower trunk back to supine.  Transfers                    Ambulation/Gait                Stairs            Wheelchair Mobility    Modified Rankin (Stroke Patients Only)       Balance Overall balance assessment: Needs assistance Sitting-balance support: Single extremity supported;Feet supported Sitting balance-Leahy Scale: Poor Sitting balance - Comments: Pt sat EOB x 10 minutes with min guard to  mod A. Postural control: Posterior lean;Right lateral lean;Left lateral lean;Other (comment) (varies)                                   Pertinent Vitals/Pain      Home Living Family/patient expects to be discharged to:: Skilled nursing facility                      Prior Function Level of Independence: Needs assistance   Gait / Transfers Assistance Needed: 2 person assist/mechanical lift for bed mobility and transfers. Non ambulatory since SCI           Hand Dominance   Dominant Hand: Right    Extremity/Trunk Assessment   Upper Extremity Assessment: RUE deficits/detail;LUE deficits/detail RUE Deficits / Details: Shoulder 3-/5, elbow 3/5, wrist 3/5     LUE Deficits / Details: shoulder and elbow 2+/5. Hand and wrist <2/5   Lower Extremity Assessment: RLE deficits/detail;LLE deficits/detail RLE Deficits / Details: grossly 2+/5 LLE Deficits / Details: grossly 2+/5     Communication   Communication: HOH  Cognition Arousal/Alertness: Awake/alert Behavior During Therapy: Anxious Overall Cognitive Status: Impaired/Different from baseline Area of Impairment: Orientation;Memory;Attention;Following commands;Problem solving Orientation Level: Disoriented to;Place;Time;Situation Current Attention Level: Sustained Memory: Decreased recall of precautions;Decreased short-term memory Following Commands: Follows one step commands consistently     Problem Solving: Decreased initiation;Requires verbal cues;Requires tactile cues;Slow processing  General Comments      Exercises        Assessment/Plan    PT Assessment All further PT needs can be met in the next venue of care  PT Diagnosis Quadraplegia   PT Problem List Decreased strength;Decreased activity tolerance;Decreased balance;Decreased mobility;Decreased cognition;Decreased knowledge of use of DME  PT Treatment Interventions     PT Goals (Current goals can be found in the Care Plan  section) Acute Rehab PT Goals PT Goal Formulation: All assessment and education complete, DC therapy    Frequency     Barriers to discharge        Co-evaluation               End of Session Equipment Utilized During Treatment: Oxygen Activity Tolerance: Patient tolerated treatment well Patient left: in bed;with call bell/phone within reach;with bed alarm set;with family/visitor present      Functional Assessment Tool Used: clinical judgement Functional Limitation: Mobility: Walking and moving around Mobility: Walking and Moving Around Current Status (V6122): At least 80 percent but less than 100 percent impaired, limited or restricted Mobility: Walking and Moving Around Goal Status [phone]): At least 40 percent but less than 60 percent impaired, limited or restricted Mobility: Walking and Moving Around Discharge Status [phone]): At least 80 percent but less than 100 percent impaired, limited or restricted    Time: 1441-1510 PT Time Calculation (min) (ACUTE ONLY): 29 min   Charges:   PT Evaluation $Initial PT Evaluation Tier I: 1 Procedure PT Treatments $Therapeutic Activity: 8-22 mins   PT G Codes:   PT G-Codes **NOT FOR INPATIENT CLASS** Functional Assessment Tool Used: clinical judgement Functional Limitation: Mobility: Walking and moving around Mobility: Walking and Moving Around Current Status (T0211): At least 80 percent but less than 100 percent impaired, limited or restricted Mobility: Walking and Moving Around Goal Status [phone]): At least 40 percent but less than 60 percent impaired, limited or restricted Mobility: Walking and Moving Around Discharge Status [phone]): At least 80 percent but less than 100 percent impaired, limited or restricted    [facility] 06/05/2015, 6:12 PM [facility] PT [phone]

## 2015-06-06 ENCOUNTER — Inpatient Hospital Stay (HOSPITAL_COMMUNITY): Payer: Medicare Other

## 2015-06-06 DIAGNOSIS — J811 Chronic pulmonary edema: Secondary | ICD-10-CM

## 2015-06-06 DIAGNOSIS — J9601 Acute respiratory failure with hypoxia: Secondary | ICD-10-CM

## 2015-06-06 DIAGNOSIS — R627 Adult failure to thrive: Secondary | ICD-10-CM

## 2015-06-06 DIAGNOSIS — J9811 Atelectasis: Secondary | ICD-10-CM

## 2015-06-06 LAB — CBC
HCT: 24.9 % — ABNORMAL LOW (ref 39.0–52.0)
HEMOGLOBIN: 7.6 g/dL — AB (ref 13.0–17.0)
MCH: 29.7 pg (ref 26.0–34.0)
MCHC: 30.5 g/dL (ref 30.0–36.0)
MCV: 97.3 fL (ref 78.0–100.0)
Platelets: 291 10*3/uL (ref 150–400)
RBC: 2.56 MIL/uL — AB (ref 4.22–5.81)
RDW: 15.8 % — ABNORMAL HIGH (ref 11.5–15.5)
WBC: 4.7 10*3/uL (ref 4.0–10.5)

## 2015-06-06 LAB — BASIC METABOLIC PANEL
Anion gap: 8 (ref 5–15)
BUN: 31 mg/dL — AB (ref 6–20)
CO2: 30 mmol/L (ref 22–32)
Calcium: 8.4 mg/dL — ABNORMAL LOW (ref 8.9–10.3)
Chloride: 109 mmol/L (ref 101–111)
Creatinine, Ser: 1.15 mg/dL (ref 0.61–1.24)
GFR calc Af Amer: >60 mL/min (ref >60)
GFR, EST NON AFRICAN AMERICAN: 60 mL/min — AB (ref >60)
GLUCOSE: 101 mg/dL — AB (ref 65–99)
POTASSIUM: 3.5 mmol/L (ref 3.5–5.1)
Sodium: 147 mmol/L — ABNORMAL HIGH (ref 135–145)

## 2015-06-06 LAB — GLUCOSE, CAPILLARY
GLUCOSE-CAPILLARY: 107 mg/dL — AB (ref 65–99)
GLUCOSE-CAPILLARY: 119 mg/dL — AB (ref 65–99)
Glucose-Capillary: 115 mg/dL — ABNORMAL HIGH (ref 65–99)
Glucose-Capillary: 127 mg/dL — ABNORMAL HIGH (ref 65–99)

## 2015-06-06 LAB — URINE CULTURE: CULTURE: NO GROWTH

## 2015-06-06 LAB — HEPARIN LEVEL (UNFRACTIONATED): HEPARIN UNFRACTIONATED: 0.68 [IU]/mL (ref 0.30–0.70)

## 2015-06-06 LAB — APTT: APTT: 105 s — AB (ref 24–37)

## 2015-06-06 MED ORDER — ENOXAPARIN SODIUM 80 MG/0.8ML ~~LOC~~ SOLN
80.0000 mg | Freq: Once | SUBCUTANEOUS | Status: AC
  Administered 2015-06-06: 80 mg via SUBCUTANEOUS
  Filled 2015-06-06: qty 0.8

## 2015-06-06 NOTE — Progress Notes (Signed)
ANTICOAGULATION CONSULT NOTE - Follow Up Consult  Pharmacy Consult for Heparin  Indication: DVT  No Known Allergies  Patient Measurements: Weight: 197 lb 2.2 oz (89.42 kg)  Vital Signs: Temp: 98.5 F (36.9 C) (11/11 0601) Temp Source: Oral (11/11 0601) BP: 118/47 mmHg (11/11 0601) Pulse Rate: 68 (11/11 0601)  Labs:  Recent Labs  06/03/15 1511 06/04/15 0632 06/05/15 0814 06/05/15 2103 06/06/15 0531  HGB 8.4* 8.6* 7.9*  --  7.6*  HCT 25.9* 28.5* 25.6*  --  24.9*  PLT 327 356 288  --  291  APTT  --   --   --  106* 105*  LABPROT  --  32.6*  --   --   --   INR  --  3.26*  --   --   --   HEPARINUNFRC  --   --   --  0.87* 0.68  CREATININE 0.97 1.12 1.08  --   --   TROPONINI  --  <0.03  --   --   --     Estimated Creatinine Clearance: 65.7 mL/min (by C-G formula based on Cr of 1.08).   Assessment: Heparin level of 0.68 and aPTT of 105 are essentially correlated so will start using heparin level only to dose. Given that HL is on upper end of range, will reduce heparin some. MD has requested to keep heparin level on lower end of range.   Goal of Therapy:  Heparin level 0.3-0.5 units/ml (lower end of range requested by MD) Monitor platelets by anticoagulation protocol: Yes   Plan:  -Reduce heparin to 1100 units/hr -1500 HL  Erhardt Dada 06/06/2015,7:00 AM

## 2015-06-06 NOTE — Consult Note (Signed)
Name: Charles Bridges MRN: [id number] DOB: February 12, 1936    ADMISSION DATE:  06/04/2015 CONSULTATION DATE:  11/11  REFERRING MD : [facility] CHIEF COMPLAINT:  FTT  BRIEF PATIENT DESCRIPTION: Frail elderly, deaf wm.  SIGNIFICANT EVENTS    STUDIES:     HISTORY OF PRESENT ILLNESS:   Unfortunate 79 yo wm smoker age [age], who suffered a fall in September 2016 resulting in central cord syndrome despite surgical decompression. He is non ambulatory and on anticoagulation for RUE thrombus from PICC line along with CVA history. Further he suffers from dementia and is almost deaf. He was residing in [facility] when on 11/10 he developed hemoptysis (on Adin). He has been treated with long term abx per ID staph bacteremia. Admitted 11/10 with new hemoptysis, desaturation(70's) and was admitted to floor. Currently on no abx and in no acute distress other than he can't hear. Urine culture was negative and BC x 2 are pending. He is on heparin drip without further hemoptysis. CxR remains unchanged with bilateral lung opacities. Suspect he need improved pumonary toilet with IS, flutter valve , no percussion due to hemoptysis.   PAST MEDICAL HISTORY :   has a past medical history of Carotid artery occlusion; Hypertension; Hyperlipidemia; Arthritis; Stroke (HCC) (1998 and  1999); Ulcer; History of vagotomy; Chronic diarrhea; Fall against object (Oct. 16, 2015); Diabetes mellitus (2006); and Hemoptysis (06/04/2015).  has past surgical history that includes Carotid endarterectomy (03/02/2000); Fracture surgery; fracture right leg; Radiology with anesthesia (N/A, 04/15/2015); Posterior cervical fusion/foraminotomy (N/A, 04/15/2015); and Radiology with anesthesia (Left, 04/30/2015). Prior to Admission medications   Medication Sig Start Date End Date Taking? Authorizing Provider  acetaminophen (TYLENOL) 325 MG tablet Take 650 mg by mouth every 4 (four) hours as needed for mild pain.   Yes Historical Provider, MD   Amino Acids-Protein Hydrolys (FEEDING SUPPLEMENT, PRO-STAT SUGAR FREE 64,) LIQD Take 30 mLs by mouth 2 (two) times daily.   Yes Historical Provider, MD  cefTRIAXone (ROCEPHIN) 1 G injection Inject 1 g into the muscle daily.   Yes Historical Provider, MD  divalproex (DEPAKOTE SPRINKLE) 125 MG capsule Take 125-250 mg by mouth 2 (two) times daily. Take 1 capsule every morning and take 2 capsules every evening   Yes Historical Provider, MD  fentaNYL (DURAGESIC - DOSED MCG/HR) 25 MCG/HR patch Place 25 mcg onto the skin every 3 (three) days.   Yes Historical Provider, MD  folic acid (FOLVITE) 1 MG tablet Take 1 tablet (1 mg total) by mouth daily. 04/22/15  Yes Shanker Levora Dredge, MD  insulin aspart (NOVOLOG) 100 UNIT/ML injection Inject 3 Units into the skin See admin instructions. Every morning if CBG above 250   Yes Historical Provider, MD  latanoprost (XALATAN) 0.005 % ophthalmic solution Place 1 drop into the right eye at bedtime. 04/03/15  Yes Historical Provider, MD  lisinopril-hydrochlorothiazide (PRINZIDE,ZESTORETIC) 20-12.5 MG per tablet Take 1 tablet by mouth daily.   Yes Historical Provider, MD  metoprolol (LOPRESSOR) 50 MG tablet Take 1 tablet (50 mg total) by mouth 2 (two) times daily. 04/22/15  Yes Shanker Levora Dredge, MD  Multiple Vitamin (MULTIVITAMIN WITH MINERALS) TABS tablet Take 1 tablet by mouth daily. 04/22/15  Yes Shanker Levora Dredge, MD  pantoprazole (PROTONIX) 40 MG tablet Take 1 tablet (40 mg total) by mouth daily. 04/22/15  Yes Shanker Levora Dredge, MD  polyethylene glycol (MIRALAX / GLYCOLAX) packet Take 17 g by mouth daily.   Yes Historical Provider, MD  pravastatin (PRAVACHOL) 40 MG tablet  Take 40 mg by mouth daily.   Yes Historical Provider, MD  senna (SENOKOT) 8.6 MG TABS tablet Take 1 tablet (8.6 mg total) by mouth 2 (two) times daily. 04/22/15  Yes Shanker Levora Dredge, MD  thiamine 100 MG tablet Take 1 tablet (100 mg total) by mouth daily. 04/22/15  Yes Shanker Levora Dredge, MD  XARELTO  STARTER PACK 15 & 20 MG TBPK Take 15-20 mg by mouth as directed. Take as directed on package: Start with one  tablet by mouth twice a day with food. On Day 22, switch to one  tablet once a day with food. 05/20/15  Yes Mancel Bale, MD  aspirin 81 MG chewable tablet Chew 1 tablet (81 mg total) by mouth daily. Patient not taking: Reported on 06/04/2015 04/22/15   Maretta Bees, MD  HYDROcodone-acetaminophen (NORCO/VICODIN) 5-325 MG tablet Take 1 tablet by mouth every 6 (six) hours as needed for severe pain. Patient not taking: Reported on 06/04/2015 05/05/15   Costin Otelia Sergeant, MD   No Known Allergies  FAMILY HISTORY:  family history includes Cancer in his mother; Other in his father and mother. SOCIAL HISTORY:  reports that he quit smoking about 15 years ago. His smoking use included Cigarettes. He has never used smokeless tobacco. He reports that he does not drink alcohol or use illicit drugs.  REVIEW OF SYSTEMS:   10 point review of system taken, please see HPI for positives and negatives.   SUBJECTIVE:   VITAL SIGNS: Temp:  [98.2 F (36.8 C)-98.7 F (37.1 C)] 98.5 F (36.9 C) (11/11 0601) Pulse Rate:  [66-77] 68 (11/11 0601) Resp:  [18-20] 18 (11/11 0601) BP: (112-140)/(40-48) 118/47 mmHg (11/11 0601) SpO2:  [92 %-100 %] 100 % (11/11 0859) Weight:  [197 lb 2.2 oz (89.42 kg)] 197 lb 2.2 oz (89.42 kg) (11/11 0601)  PHYSICAL EXAMINATION: General:  Frail  elderly, demented, deaf wm in no distress. Neuro:  HOH, left cva, central cord syndrome. Follows commads with with upper ext r>l HEENT:  No Jvd, Oral mucosa wet Cardiovascular: HSD Lungs:  Decreased bs bases, poor air movement Abdomen:  Obese + bs Musculoskeletal: Warm Lower ext boots Skin:  warm   Recent Labs Lab 06/04/15 0632 06/05/15 0814 06/06/15 0531  NA 142 141 147*  K 3.4* 3.2* 3.5  CL 106 105 109  CO2 BUN 25* 28* 31*  CREATININE 1.12 1.08 1.15  GLUCOSE 122* 93 101*    Recent Labs Lab  06/04/15 0632 06/05/15 0814 06/06/15 0531  HGB 8.6* 7.9* 7.6*  HCT 28.5* 25.6* 24.9*  WBC 7.8 5.6 4.7  PLT 356 288 291   Dg Chest Port 1 View  06/06/2015  CLINICAL DATA:  Shortness of breath. EXAM: PORTABLE CHEST 1 VIEW COMPARISON:  06/05/2015 FINDINGS: Cardiac silhouette is upper limits of normal in size, unchanged. Coarse interstitial and airspace opacities in both lungs with an upper lobe predominance have not significantly changed. There are likely persistent small bilateral pleural effusions. No pneumothorax. IMPRESSION: Unchanged bilateral lung opacities which may reflect multifocal pneumonia or edema. Electronically Signed   By: Sebastian Ache M.D.   On: 06/06/2015 07:10   Dg Chest Port 1 View  06/05/2015  CLINICAL DATA:  Shortness of Breath EXAM: PORTABLE CHEST - 1 VIEW COMPARISON:  CT from the previous day, and earlier studies FINDINGS: coarse airspace opacities in the upper lobes left greater than right, which were present on the prior CT, not convincingly changed in distribution or severity. Heart  size upper limits normal for technique. Small pleural effusions. No pneumothorax. Orthopedic hardware in the right humeral head and lower cervical spine, incompletely visualized. IMPRESSION: 1. Little change in asymmetric infiltrates or edema with upper lobe predominance, left greater than right. 2. Persistent small effusions. Electronically Signed   By: Corlis Leak  Hassell M.D.   On: 06/05/2015 08:53    ASSESSMENT: Discussion: Unfortunate 79 yo wm smoker age [age], who suffered a fall in September 2016 resulting in central cord syndrome despite surgical decompression. He is non ambulatory and on anticoagulation for RUE thrombus from PICC line along with CVA history. Further he suffers from dementia and is almost deaf. He was residing in [facility] nursing home when on 11/10 he developed hemoptysis (on [place]). He has been treated with long term abx per ID staph bacteremia. Admitted 11/10 with new  hemoptysis, desaturation(70's) and was admitted to floor. Currently on no abx and in no acute distress other than he can't hear. Urine culture was negative and BC x 2 are pending. He is on heparin drip without further hemoptysis. CxR remains unchanged with bilateral lung opacities. Suspect he need improved pumonary toilet with IS, flutter valve , no percussion due to hemoptysis.      COPD (chronic obstructive pulmonary disease) (HCC)     Hypoxia (HCC)    Hemoptysis while on anticoagulation   History of CVA (cerebrovascular accident)   Cervical spinal cord injury without evidence of spinal bone injury (HCC) post fall   Central cord syndrome at C3 level of cervical spinal cord (HCC)   Left-sided weakness, post cva   Dementia   HOH   Anemia   Gram-positive cocci bacteremia   Coagulase negative Staphylococcus bacteremia   Diabetes mellitus, type 2 (HCC)   Acute respiratory failure (HCC)    FTT (failure to thrive) in adult    Venous thromboses on anticoagualtion      PLAN: 1. If further hemoptysis would stop heparin 2. Pulmonary toilet  With IS and flutter valve. and repeat c x r in future 3. Chest percussion will hold off due to hemoptysis 4. O2 as needed 5. PCCM will follow up on 11/14 or sooner if needed.  Brett CanalesSteve Minor ACNP [facility] Pager [phone] till 3 pm If no answer page [phone] 06/06/2015, 12:19 PM   Attending Note:  79 year old male quad due to spinal cord compression, dementia and deaf on xarelto who presents to the hospital with SOB noted to have pulmonary edema.  Hemoptysis noted yesterday with patient now on heparin.  I reviewed CXR myself, pulmonary edema noted.  Diffuse crackles on exam.  PCCM called for hemoptysis, hypoxemia and atelectasis.  Discussed with PCCM-NP.  Hemoptysis: cough with patient on heparin (not sure why).  - Hold heparin if further hemoptysis, seems stable for now.  - If worsens then will consider bronch.  Hypoxemia: due to pulmonary  edema and atelectasis.  - Supplemental O2.  - Titrate O2 for sat of 88-92%.  - Treat above.  Atelectasis:  - Pulmonary toilet.  - Chest percussion vest.  - Mobilization as able.  Pulmonary edema: diuresed dry weight at this point.  - Maintain at dry weight as able.  - May consider repeat echo or check on BNP.  PCCM will see again on Monday.  Patient seen and examined, agree with above note.  I dictated the care and orders written for this patient under my direction.  Alyson ReedyWesam G Yacoub, MD [phone]

## 2015-06-06 NOTE — Care Management Important Message (Signed)
Important Message  Patient Details  Name: Charles Bridges MRN: [id number] Date of Birth: [birth year]   Medicare Important Message Given:  Yes    [name] [name] 06/06/2015, 2:38 PM

## 2015-06-06 NOTE — Progress Notes (Signed)
06/05/15 0900  SLP Visit Information  SLP Received On 06/05/15  General Information  HPI Charles Bridges is a 79 y.o. male with multiple medical problems chronically anticoagulated for history of atrial fibrillation/CVAs. Patient was on Coumadin, changeed to Xarelto approximately one week ago . He sustained a cervical fracture after a fall in late September, status post decompression in October. Readmitted early October with staff bacteremia requiring prolonged antibiotics. Patient developed thrombus in RUE where PICC was placed, PICC now in LUE. He follows with Dr. Jerolyn Centerynthia Snyder, infectious disease. Patient still getting IV antibiotics at [facility] he has been for rehabilitation following the fall. Patient brought into the hospital for hemoptysis, hypoxia and mental status changes on home 02, sats in 70's. Pt had an MBS on 10/7 recommending uprgrade from nectar thick liquids to thin liquids with an intermittent throat clear and second swallow to clear trace penetrate and mild residuals, but was returned to nectar thick liquids prior to d/c due to inability to follow strategies secondary to pain. Current CXR shows asymmetric infiltrates or edema with upper lobe predominance, left greater than right.  Previous Swallow Assessment see HPI  Diet Prior to this Study Regular;Thin liquids  Temperature Spikes Noted No  Respiratory Status Room air  History of Recent Intubation No  Behavior/Cognition Alert;Cooperative;Requires cueing  Oral Care Completed by SLP Recent completion by staff  Oral Cavity - Dentition (dentures)  Vision Functional for self-feeding  Self-Feeding Abilities Able to feed self  Patient Positioning Upright in bed  Baseline Vocal Quality Normal  Volitional Cough Strong  Volitional Swallow Able to elicit  Type of Study Bedside swallow evaluation  Oral Motor/Sensory Function  Overall Oral Motor/Sensory Function WFL  Thin Liquid  Thin Liquid Impaired  Presentation Cup;Straw;Self Fed   Pharyngeal  Phase Impairments Throat Clearing - Immediate;Cough - Immediate;Multiple swallows  Nectar Thick Liquid  Nectar Thick Liquid WFL  Honey Thick Liquid  Honey Thick Liquid NT  Puree  Puree WFL  Solid  Solid NT (pt refused)  Presentation Self Fed  SLP - End of Session  Patient left in bed;with nursing in room  SLP Assessment  Clinical Impression Statement (ACUTE ONLY) Pt demonstrates ongoing dysphagia consistent with findings from prior admit and MBS. Pt is unable to recall recent diet or any recommendations from SNF SLP. He demonstrates immediate throat clearing and multiple swallows and even cough following sips with thin liquids indicating ongoing penetration and residuals. Tolerates sips of nectar well.  Wife reports pt has been on nectar thickened liquids at [facility] and SLP has been working toward upgrade. Based on subjective findings today and pts ongoing memory impairment, would not recommend thin liquids at this time. SLP will f/u for tolerance of nectar thick liquids and mechanical soft (dys 3) diet.   Impact on safety and function Moderate aspiration risk  Other Related Risk Factors History of dysphagia;Cognitive impairment  Swallow Evaluation Recommendations  SLP Diet Recommendations Nectar;Age appropriate regular  Thickener user Powder  Liquid Administration via Cup;Straw  Medication Administration Whole meds with puree  Supervision Staff to assist with self feeding  Compensations Minimize environmental distractions;Small sips/bites;Slow rate  Postural Changes Seated upright at 90 degrees  Treatment Plan  Oral Care Recommendations Oral care BID  Other Recommendations Order thickener from pharmacy  Treatment Recommendations Therapy as outlined in treatment plan below  Follow up Recommendations Skilled Nursing facility  Speech Therapy Frequency (ACUTE ONLY) min 2x/week  Treatment Duration 2 weeks  Interventions Aspiration precaution training;Compensatory  techniques;Patient/family education;Trials of  upgraded texture/liquids;Diet toleration management by SLP  Prognosis  Prognosis for Safe Diet Advancement Fair  Barriers to Reach Goals Cognitive deficits  Individuals Consulted  Consulted and Agree with Results and Recommendations Patient;Family member/caregiver;RN  Family Member Consulted wife  Progression Toward Goals  Progression toward goals Progressing toward goals  SLP Time Calculation  SLP Start Time (ACUTE ONLY) 0910  SLP Stop Time (ACUTE ONLY) 0930  SLP Time Calculation (min) (ACUTE ONLY) 20 min  SLP G-Codes **NOT FOR INPATIENT CLASS**  Functional Assessment Tool Used clinical judgement  Functional Limitations Swallowing  Swallow Current Status (W0981) CJ  Swallow Goal Status (X9147) CJ  SLP Evaluations  $ SLP Speech Visit 1 Procedure  SLP Evaluations  $BSS Swallow 1 Procedure  $Swallowing Treatment 1 Procedure

## 2015-06-06 NOTE — Progress Notes (Signed)
Attempted right upper arm PICC line insertion x 3, unsuccessful. Unable to thread guidewire. Difficult insertion related to patient cooperation. RN aware. IR recommended.

## 2015-06-06 NOTE — Progress Notes (Addendum)
PATIENT DETAILS Name: Charles Bridges Age: 79 y.o. Sex: male Date of Birth: 09-14-1935 Admit Date: 06/04/2015 Admitting Physician Waldemar Dickens, MD YIA:[id number],MOLMB L, MD   Brief narrative 79 year old male with history of prior CVA, history of carotid artery occlusion status post CEA, EtOH use, recent C4-5 fracture with cord edema-and resultant central cord syndrome (September 2016), recent history of coagulase neg staph bacteremia on IV vancomycin, history of right upper extremity DVT (likely from PICC) on anticoagulation-presented to the hospital for evaluation of acute hypoxic respiratory failure and hemoptysis.  Subjective: No further hemoptysis-still requiring significant O2  Assessment/Plan: Active Problems: Acute hypoxic respiratory failure: Suspect ? acute diastolic heart failure (B/L infiltrates with effusion on CT Chest), afebrile without leukocytosis-on IV vancomycin prior to this admission-doubt HCAP/Aspiration (Procalcitonin 0.12). However weight is 197 which is significantly below his weight last admission-217 lbs. No response to diuretics-BP soft-BUN/Na increasing-suspect not a lot of room for pushing diuretics further. Think  I/O's are inaccurate. CXR essentially unchanged-have consulted PCCM for assistance  B/L Pulmonary Infiltrates:?acute diastolic heart failure vs aspiration/chemical pneumonitis-see above. Since afebrile-no fever and low procalcitonin levels-monitor off Abx-await PCCM inpput  Hemoptysis: Secondary to above-none since admission-continue IV heparin gtt-patient has recent history of right upper extremity DVT to the subclavian vein.  Recent history of coagulase negative bacteremia: ID consulted-recommendations are to continue to monitor off antibiotics. PICC line has been removed, catheter tip culture and blood cultures negative to date. Afebrile and without leukocytosis.  Right upper extremity DVT: Secondary to PICC line, on Xarelto prior  to this admission-starting heparin-see above  Addendum 7pm Lost IV access earlier in the day-IV team unable to place PICC Line-PCCM consulted for Central Line-spoke with Dr Lake Bells x 2.  Acute on chronic encephalopathy: Suspect some amount of alcohol related encephalopathy at baseline-suspect worsened by acute illness/narcotics/hypoxia. Easily redirectable-answers most questions appropriately. Continue with Depakote and Supportive care.  Anemia: Suspect secondary to acute illness with anemia from chronic disease at baseline-doubt from hemoptysis-cautiously continue with IV heparin-and follow CBCs. No further hemoptysis since admission-per RN stool is brown in color.Does not seem to symptomatic-will transfuse if significant drop in Hb-monitor for now.  Hx of Cord compression with Central cord syndrome/quadriparesis (Sept 2016):Following a mechanical fall and resultant C4-5 fracture with cord edema C3 -Patient S/p C3,4,5,and C6 laminectomies, Posterior arthrodesis C3-6, morselized allograft by Dr. Cyndy Freeze  on 04/15/2015. His right upper extremity around 3-4/5, left upper extremity around 1-2/5, bilateral lower extremities around 3/5.  Chronic indwelling Foley catheter: Removed on admission-voiding trial failed 11/10-as patient developed acute urinary retention-foley reinserted  Essential hypertension: Continue to hold all antihypertensives-BP soft-was on IV Lasix-resume antihypertensives when able  History of CVA/carotid artery disease-status post carotid endarterectomy: Not on aspirin-as an anticoagulation, has significant deficits as outlined above due to central cord syndrome following a mechanical fall   ? Prior history of atrial fibrillation: On IV heparin-telemetry shows sinus rhythm-hold metoprolol for now  Dyslipidemia: Continue statin  History of peptic ulcer disease: Continue with PPI, is s/p vagotomy.  History of alcohol abuse: Resident of SNF since his discharge September this  year-doubt any ongoing alcohol use  Bilateral lower extremity/heel pressure ulcers: Seen by wound care RN-recommendations are foam dressing to affected areas, continue Prevalon boots  ? History of EM:LJQG SSI-A1c pending  Left lung base nodule: Given risk factors for bronchogenic carcinoma, follow-up chest CT at 1 year is recommended-defer to outpatient setting  Disposition: Remain inpatient-Back to SNF when able  Antimicrobial agents  See below  Anti-infectives    None      DVT Prophylaxis: Heparin gtt  Code Status: Partial Code-DNI  Family Communication None at baseline  Procedures: None  CONSULTS:  None  Time spent 25 minutes-Greater than 50% of this time was spent in counseling, explanation of diagnosis, planning of further management, and coordination of care.  MEDICATIONS: Scheduled Meds: . acetaminophen  1,000 mg Oral TID  . antiseptic oral rinse  7 mL Mouth Rinse BID  . divalproex  125 mg Oral Daily   And  . divalproex  250 mg Oral Q2000  . feeding supplement (ENSURE ENLIVE)  237 mL Oral BID BM  . feeding supplement (PRO-STAT SUGAR FREE 64)  30 mL Oral BID  . fentaNYL  25 mcg Transdermal Q72H  . ipratropium-albuterol  3 mL Nebulization TID  . latanoprost  1 drop Right Eye QHS  . multivitamin with minerals  1 tablet Oral Daily  . pantoprazole  40 mg Oral Daily  . polyethylene glycol  17 g Oral Daily  . pravastatin  40 mg Oral Daily  . senna  1 tablet Oral BID  . sodium chloride  3 mL Intravenous Q12H  . thiamine  100 mg Oral Daily   Continuous Infusions: . heparin 1,150 Units/hr (06/05/15 2208)   PRN Meds:.albuterol, ondansetron **OR** ondansetron (ZOFRAN) IV, oxyCODONE, RESOURCE THICKENUP CLEAR    PHYSICAL EXAM: Vital signs in last 24 hours: Filed Vitals:   06/05/15 1410 06/05/15 1639 06/05/15 2128 06/06/15 0601  BP:  140/48 112/40 118/47  Pulse:  77 66 68  Temp:  98.2 F (36.8 C) 98.7 F (37.1 C) 98.5 F (36.9 C)  TempSrc:  Oral Oral  Oral  Resp:  _0 Weight:    89.42 kg (197 lb 2.2 oz)  SpO2: 94% 93% 95% 92%    Weight change:  Filed Weights   06/05/15 0922 06/06/15 0601  Weight: 89.359 kg (197 lb) 89.42 kg (197 lb 2.2 oz)   Body mass index is 25.65 kg/(m^2).   Gen Exam: Awake and alert with clear speech.  Neck: Supple, No JVD.   Chest: B/L Clear.   CVS: S1 S2 Regular, no murmurs.  Abdomen: soft, BS +, non tender, non distended.  Extremities: no edema, lower extremities warm to touch. Neurologic: see above Skin: No Rash.    Intake/Output from previous day:  Intake/Output Summary (Last 24 hours) at 06/06/15 0848 Last data filed at 06/06/15 0600  Gross per 24 hour  Intake 562.13 ml  Output    300 ml  Net 262.13 ml     LAB RESULTS: CBC  Recent Labs Lab 06/03/15 1511 06/04/15 0632 06/05/15 0814 06/06/15 0531  WBC 6.6 7.8 5.6 4.7  HGB 8.4* 8.6* 7.9* 7.6*  HCT 25.9* 28.5* 25.6* 24.9*  PLT 327 356 288 291  MCV 93.8 96.6 97.7 97.3  MCH 30.4 29.2 30.2 29.7  MCHC 32.4 30.2 30.9 30.5  RDW 15.4 15.6* 15.8* 15.8*  LYMPHSABS 0.5* 0.6*  --   --   MONOABS 0.5 0.5  --   --   EOSABS 0.4 0.3  --   --   BASOSABS 0.0 0.0  --   --     Chemistries   Recent Labs Lab 06/03/15 1511 06/04/15 0632 06/05/15 0814 06/06/15 0531  NA 142 142 141 147*  K 3.5 3.4* 3.2* 3.5  CL 104 106 105 109  CO2 25 24 28  30  GLUCOSE 119* 122* 93 101*  BUN 26* 25* 28* 31*  CREATININE 0.97 1.12 1.08 1.15  CALCIUM 8.1* 8.7* 8.4* 8.4*    CBG:  Recent Labs Lab 06/05/15 0756 06/05/15 1216 06/05/15 1654 06/05/15 2155 06/06/15 0740  GLUCAP 90 123* 87 136* 107*    GFR Estimated Creatinine Clearance: 61.7 mL/min (by C-G formula based on Cr of 1.15).  Coagulation profile  Recent Labs Lab 06/04/15 0632  INR 3.26*    Cardiac Enzymes  Recent Labs Lab 06/04/15 0632  TROPONINI <0.03    Invalid input(s): POCBNP No results for input(s): DDIMER in the last 72 hours. No results for input(s): HGBA1C in  the last 72 hours. No results for input(s): CHOL, HDL, LDLCALC, TRIG, CHOLHDL, LDLDIRECT in the last 72 hours. No results for input(s): TSH, T4TOTAL, T3FREE, THYROIDAB in the last 72 hours.  Invalid input(s): FREET3 No results for input(s): VITAMINB12, FOLATE, FERRITIN, TIBC, IRON, RETICCTPCT in the last 72 hours. No results for input(s): LIPASE, AMYLASE in the last 72 hours.  Urine Studies No results for input(s): UHGB, CRYS in the last 72 hours.  Invalid input(s): UACOL, UAPR, USPG, UPH, UTP, UGL, UKET, UBIL, UNIT, UROB, ULEU, UEPI, UWBC, URBC, UBAC, CAST, UCOM, BILUA  MICROBIOLOGY: Recent Results (from the past 240 hour(s))  Blood culture (routine single)     Status: None (Preliminary result)   Collection Time: 06/03/15  2:55 PM  Result Value Ref Range Status   Preliminary Report Blood Culture received; No Growth to date;  Preliminary   Preliminary Report Culture will be held for 5 days before issuing  Preliminary   Preliminary Report a Final Negative report.  Preliminary  Blood culture (routine single)     Status: None (Preliminary result)   Collection Time: 06/03/15  3:09 PM  Result Value Ref Range Status   Preliminary Report Blood Culture received; No Growth to date;  Preliminary   Preliminary Report Culture will be held for 5 days before issuing  Preliminary    Comment: Culture results may be compromised due to an excessive volume of blood received in culture bottles.    Preliminary Report a Final Negative report.  Preliminary  Culture, blood (routine x 2)     Status: None (Preliminary result)   Collection Time: 06/04/15  6:14 AM  Result Value Ref Range Status   Specimen Description BLOOD RIGHT HAND  Final   Special Requests BOTTLES DRAWN AEROBIC AND ANAEROBIC 10CC  Final   Culture NO GROWTH 1 DAY  Final   Report Status PENDING  Incomplete  Culture, blood (routine x 2)     Status: None (Preliminary result)   Collection Time: 06/04/15  6:14 AM  Result Value Ref Range  Status   Specimen Description BLOOD RIGHT ARM  Final   Special Requests BOTTLES DRAWN AEROBIC ONLY 10CC  Final   Culture NO GROWTH 1 DAY  Final   Report Status PENDING  Incomplete  Urine culture     Status: None (Preliminary result)   Collection Time: 06/04/15  6:30 AM  Result Value Ref Range Status   Specimen Description URINE, RANDOM  Final   Special Requests NONE  Final   Culture NO GROWTH < 24 HOURS  Final   Report Status PENDING  Incomplete  Cath Tip Culture     Status: None (Preliminary result)   Collection Time: 06/04/15 11:15 AM  Result Value Ref Range Status   Specimen Description CATH TIP  Final   Special Requests L PICC TIP  Final   Culture NO GROWTH Performed at [facility]   Final   Report Status PENDING  Incomplete  MRSA PCR Screening     Status: None   Collection Time: 06/04/15 10:18 PM  Result Value Ref Range Status   MRSA by PCR NEGATIVE NEGATIVE Final    Comment:        The GeneXpert MRSA Assay (FDA approved for NASAL specimens only), is one component of a comprehensive MRSA colonization surveillance program. It is not intended to diagnose MRSA infection nor to guide or monitor treatment for MRSA infections.     RADIOLOGY STUDIES/RESULTS: Ct Angio Chest Pe W/cm &/or Wo Cm  06/04/2015  CLINICAL DATA:  Hemoptysis. Hypertension. Hyperlipidemia. Prior CVA. Ex-smoker. EXAM: CT ANGIOGRAPHY CHEST WITH CONTRAST TECHNIQUE: Multidetector CT imaging of the chest was performed using the standard protocol during bolus administration of intravenous contrast. Multiplanar CT image reconstructions and MIPs were obtained to evaluate the vascular anatomy. CONTRAST:  36mL OMNIPAQUE IOHEXOL 350 MG/ML SOLN COMPARISON:  Outside CT dated 04/13/2015. Prior report not submitted. Chest radiograph of 04/27/2015. FINDINGS: Mediastinum/Nodes: Motion and patient position degraded exam. Arms not raised above the head. No pulmonary embolism to the lobar to large segmental level. A  left-sided PICC line which terminates at the left brachiocephalic vein. Aortic and branch vessel atherosclerosis. Mild cardiomegaly. Multivessel coronary artery atherosclerosis. Right paratracheal adenopathy at 1.3 cm (image 36, series 4). This is new since 01/07/2010. Prevascular nodes measure up to the 1.3 cm. Lungs/Pleura: Small to moderate bilateral pleural effusions. Moderate centrilobular emphysema. A 3 mm left lower lobe pulmonary nodule (image 50 for, series 6). Absent in 2011. Apices minimally excluded. Dependent upper and less so lower lobe patchy airspace and interstitial opacities. Upper abdomen: Normal imaged portions of the liver, spleen, pancreas, adrenal glands. gastric diverticulum, (image 92, series 4). Musculoskeletal: Remote left rib trauma. Suspect ankylosing spondylitis throughout the thoracic spine. Review of the MIP images confirms the above findings. IMPRESSION: 1. Motion and position degraded exam. No pulmonary embolism to the large segmental level. 2. Bilateral pleural effusions with bilateral dependent patchy interstitial and airspace opacities. Considerations include pulmonary edema superimposed upon emphysema versus multifocal infection. 3. Adenopathy which could be reactive. Consider followup with chest CT at 3 months to confirm resolution. 4.  Atherosclerosis, including within the coronary arteries. 5. Gastric diverticulum. 6. Probable ankylosing spondylitis. 7. Left lung base nodule. Given risk factors for bronchogenic carcinoma, follow-up chest CT at 1 year is recommended. This recommendation follows the consensus statement: "Guidelines for Management of Small Pulmonary Nodules Detected on CT Scans: A Statement from the [place]" as published in Radiology 2005; 237:395-400. Available online at: [email or web address].[email or web address] 8. Left-sided PICC line terminating at the left brachiocephalic vein. Electronically Signed   By: Abigail Miyamoto M.D.   On:  06/04/2015 07:53   Ir US Guide Vasc Access Left  05/23/2015  INDICATION: Bacteremia and previous right PICC line malfunctioning. Request for new left arm peripherally inserted central catheter for antibiotics. EXAM: ULTRASOUND AND FLUOROSCOPIC GUIDED PICC LINE INSERTION MEDICATIONS: None. CONTRAST:  None FLUOROSCOPY TIME:  18 seconds. COMPLICATIONS: None immediate TECHNIQUE: The procedure, risks, benefits, and alternatives were explained to the patient's wife and informed written consent was obtained. A timeout was performed prior to the initiation of the procedure. The left upper extremity was prepped with chlorhexidine in a sterile fashion, and a sterile drape was applied covering the operative field. Maximum barrier sterile technique with sterile gowns and gloves were used for the procedure. A  timeout was performed prior to the initiation of the procedure. Local anesthesia was provided with 1% lidocaine. Under direct ultrasound guidance, the left brachial vein was accessed with a micropuncture kit after the overlying soft tissues were anesthetized with 1% lidocaine. An ultrasound image was saved for documentation purposes. A guidewire was advanced to the level of the superior caval-atrial junction for measurement purposes and the PICC line was cut to length. A peel-away sheath was placed and a 44 cm, 5 [place], single lumen was inserted to level of the superior caval-atrial junction. A post procedure spot fluoroscopic was obtained. The catheter easily aspirated and flushed and was sutured in place. A dressing was placed. The patient tolerated the procedure well without immediate post procedural complication. FINDINGS: After catheter placement, the tip lies within the superior cavoatrial junction. The catheter aspirates and flushes normally and is ready for immediate use. IMPRESSION: Successful ultrasound and fluoroscopic guided placement of a left brachial vein approach, 44 cm, 5 [place], single lumen PICC with  tip at the superior caval-atrial junction. The PICC line is ready for immediate use. Read By:  Tsosie Billing PA-C Electronically Signed   By: Aletta Edouard M.D.   On: 05/23/2015 10:35   Dg Chest Port 1 View  06/06/2015  CLINICAL DATA:  Shortness of breath. EXAM: PORTABLE CHEST 1 VIEW COMPARISON:  06/05/2015 FINDINGS: Cardiac silhouette is upper limits of normal in size, unchanged. Coarse interstitial and airspace opacities in both lungs with an upper lobe predominance have not significantly changed. There are likely persistent small bilateral pleural effusions. No pneumothorax. IMPRESSION: Unchanged bilateral lung opacities which may reflect multifocal pneumonia or edema. Electronically Signed   By: Logan Bores M.D.   On: 06/06/2015 07:10   Dg Chest Port 1 View  06/05/2015  CLINICAL DATA:  Shortness of Breath EXAM: PORTABLE CHEST - 1 VIEW COMPARISON:  CT from the previous day, and earlier studies FINDINGS: coarse airspace opacities in the upper lobes left greater than right, which were present on the prior CT, not convincingly changed in distribution or severity. Heart size upper limits normal for technique. Small pleural effusions. No pneumothorax. Orthopedic hardware in the right humeral head and lower cervical spine, incompletely visualized. IMPRESSION: 1. Little change in asymmetric infiltrates or edema with upper lobe predominance, left greater than right. 2. Persistent small effusions. Electronically Signed   By: Lucrezia Europe M.D.   On: 06/05/2015 08:53   Ir Fluoro Guide Cv Midline Picc Left  05/23/2015  INDICATION: Bacteremia and previous right PICC line malfunctioning. Request for new left arm peripherally inserted central catheter for antibiotics. EXAM: ULTRASOUND AND FLUOROSCOPIC GUIDED PICC LINE INSERTION MEDICATIONS: None. CONTRAST:  None FLUOROSCOPY TIME:  18 seconds. COMPLICATIONS: None immediate TECHNIQUE: The procedure, risks, benefits, and alternatives were explained to the patient's  wife and informed written consent was obtained. A timeout was performed prior to the initiation of the procedure. The left upper extremity was prepped with chlorhexidine in a sterile fashion, and a sterile drape was applied covering the operative field. Maximum barrier sterile technique with sterile gowns and gloves were used for the procedure. A timeout was performed prior to the initiation of the procedure. Local anesthesia was provided with 1% lidocaine. Under direct ultrasound guidance, the left brachial vein was accessed with a micropuncture kit after the overlying soft tissues were anesthetized with 1% lidocaine. An ultrasound image was saved for documentation purposes. A guidewire was advanced to the level of the superior caval-atrial junction for measurement purposes and the PICC  line was cut to length. A peel-away sheath was placed and a 44 cm, 5 [place], single lumen was inserted to level of the superior caval-atrial junction. A post procedure spot fluoroscopic was obtained. The catheter easily aspirated and flushed and was sutured in place. A dressing was placed. The patient tolerated the procedure well without immediate post procedural complication. FINDINGS: After catheter placement, the tip lies within the superior cavoatrial junction. The catheter aspirates and flushes normally and is ready for immediate use. IMPRESSION: Successful ultrasound and fluoroscopic guided placement of a left brachial vein approach, 44 cm, 5 [place], single lumen PICC with tip at the superior caval-atrial junction. The PICC line is ready for immediate use. Read By:  Tsosie Billing PA-C Electronically Signed   By: Aletta Edouard M.D.   On: 05/23/2015 10:35    Oren Binet, MD  [facility] Pager:[phone]  If 7PM-7AM, please contact night-coverage [email or web address] Password [email or web address] 06/06/2015, 8:48 AM   LOS: 2 days

## 2015-06-07 ENCOUNTER — Inpatient Hospital Stay (HOSPITAL_COMMUNITY): Payer: Medicare Other

## 2015-06-07 DIAGNOSIS — D649 Anemia, unspecified: Secondary | ICD-10-CM

## 2015-06-07 DIAGNOSIS — J9601 Acute respiratory failure with hypoxia: Principal | ICD-10-CM

## 2015-06-07 DIAGNOSIS — J9600 Acute respiratory failure, unspecified whether with hypoxia or hypercapnia: Secondary | ICD-10-CM

## 2015-06-07 DIAGNOSIS — R042 Hemoptysis: Secondary | ICD-10-CM

## 2015-06-07 DIAGNOSIS — J432 Centrilobular emphysema: Secondary | ICD-10-CM

## 2015-06-07 LAB — HEPARIN LEVEL (UNFRACTIONATED)
Heparin Unfractionated: 0.47 IU/mL (ref 0.30–0.70)
Heparin Unfractionated: 0.3 IU/mL (ref 0.30–0.70)

## 2015-06-07 LAB — GLUCOSE, CAPILLARY
GLUCOSE-CAPILLARY: 120 mg/dL — AB (ref 65–99)
Glucose-Capillary: 118 mg/dL — ABNORMAL HIGH (ref 65–99)
Glucose-Capillary: 112 mg/dL — ABNORMAL HIGH (ref 65–99)
Glucose-Capillary: 107 mg/dL — ABNORMAL HIGH (ref 65–99)

## 2015-06-07 LAB — CBC
HEMATOCRIT: 27.5 % — AB (ref 39.0–52.0)
HEMOGLOBIN: 8.4 g/dL — AB (ref 13.0–17.0)
MCH: 30.2 pg (ref 26.0–34.0)
MCHC: 30.5 g/dL (ref 30.0–36.0)
MCV: 98.9 fL (ref 78.0–100.0)
Platelets: 281 10*3/uL (ref 150–400)
RBC: 2.78 MIL/uL — ABNORMAL LOW (ref 4.22–5.81)
RDW: 16 % — ABNORMAL HIGH (ref 11.5–15.5)
WBC: 6.1 10*3/uL (ref 4.0–10.5)

## 2015-06-07 LAB — HEMOGLOBIN A1C
HEMOGLOBIN A1C: 6 % — AB (ref 4.8–5.6)
MEAN PLASMA GLUCOSE: 126 mg/dL

## 2015-06-07 LAB — CATH TIP CULTURE: Culture: NO GROWTH

## 2015-06-07 LAB — PROCALCITONIN: Procalcitonin: 0.27 ng/mL

## 2015-06-07 NOTE — Progress Notes (Addendum)
PATIENT DETAILS Name: Charles Bridges Age: 79 y.o. Sex: male Date of Birth: 03-01-1936 Admit Date: 06/04/2015 Admitting Physician Waldemar Dickens, MD QQP:[id number],OIZTI L, MD   Brief narrative  79 year old male with history of prior CVA, history of carotid artery occlusion status post CEA, EtOH use, recent C4-5 fracture with cord edema-and resultant central cord syndrome (September 2016), recent history of coagulase neg staph bacteremia on IV vancomycin, history of right upper extremity DVT (likely from PICC) on anticoagulation-presented to the hospital for evaluation of acute hypoxic respiratory failure and hemoptysis.  Subjective:  Patient in bed, no headache, no chest- abd pain, improved SOB, no hemoptysis since 06/05/2015.  Assessment/Plan:   Acute hypoxic respiratory failure with bilateral pulmonary infiltrates and hemoptysis: Recent echogram done one month ago shows EF of 60% with no evidence of LVH.   Could be mild fluid overload, he was afebrile with a normal pro-calcitonin, no leukocytosis, speech following to minimize the chance of aspiration. Has been diuresed in the last few days and now sodium is on the higher side. Current weight is 200 pounds and his baseline dry weight appears to be 217 pounds. Currently off diuretics, pulmonary critical care on board. Currently not on antibiotics. Continue supportive care with pulmonary critical care to address this issue of bilateral infiltrates and acute hypoxic respiratory failure. Again reason is unclear.  Hemoptysis: Secondary to above-none since admission-continue IV heparin gtt-patient has recent history of right upper extremity DVT to the subclavian vein. Was on xaralto at baseline.  Recent history of coagulase negative bacteremia: ID consulted-recommendations are to continue to monitor off antibiotics. PICC line has been removed, catheter tip culture and blood cultures negative to date. Afebrile and without  leukocytosis.  Right upper extremity DVT: Secondary to PICC line, was in [place] currently on heparin drip, repeat right upper quadrant venous duplex is negative discontinue long-term anticoagulation.  Acute on chronic encephalopathy: Suspect some amount of alcohol related encephalopathy at mild delirium due to acute sickness and hospitalization. Continue with Depakote and Supportive care.  Anemia: Suspect secondary to acute illness with anemia from chronic disease -sent minimal hemoptysis, monitor no need for transfusion.  Hx of Cord compression with Central cord syndrome/quadriparesis (Sept 2016):Following a mechanical fall and resultant C4-5 fracture with cord edema C3 -Patient S/p C3,4,5,and C6 laminectomies, Posterior arthrodesis C3-6, morselized allograft by Dr. Cyndy Freeze  on 04/15/2015. His right upper extremity around 3-4/5, left upper extremity around 1-2/5, bilateral lower extremities around 3/5.  Chronic indwelling Foley catheter: Removed on admission-voiding trial failed 11/10-as patient developed acute urinary retention-foley reinserted  Essential hypertension: Continue to hold all antihypertensives-BP, Will monitor.  History of CVA/carotid artery disease-status post carotid endarterectomy: Not on aspirin-as an anticoagulation, has significant deficits as outlined above due to central cord syndrome following a mechanical fall   ? Prior history of atrial fibrillation:   On IV heparin-telemetry shows sinus rhythm-hold metoprolol for now  Dyslipidemia: Continue statin  History of peptic ulcer disease: Continue with PPI, is s/p vagotomy.  History of alcohol abuse: Resident of SNF since his discharge September this year-doubt any ongoing alcohol use  Bilateral lower extremity/heel pressure ulcers: Seen by wound care RN-recommendations are foam dressing to affected areas, continue Prevalon boots  Left lung base nodule: Given risk factors for bronchogenic carcinoma, follow-up chest CT at  1 year is recommended-defer to outpatient setting  ? History of DM: Monitor CBGs off sliding scale  Lab Results  Component Value Date   HGBA1C 6.0* 06/06/2015    CBG (last 3)   Recent Labs  06/06/15 1645 06/06/15 2142 06/07/15 0809  GLUCAP 115* 127* 118*       Disposition: Remain inpatient-Back to SNF when able  Anti-infectives    None      DVT Prophylaxis: Heparin gtt  Code Status: Partial Code-DNI  Family Communication None at baseline  Procedures:  CT chest, right upper quadrant venous duplex  CONSULTS:  Pulmonary critical care  IR to place L.IJ PICC  Time spent 25 minutes-Greater than 50% of this time was spent in counseling, explanation of diagnosis, planning of further management, and coordination of care.  MEDICATIONS: Scheduled Meds: . acetaminophen  1,000 mg Oral TID  . antiseptic oral rinse  7 mL Mouth Rinse BID  . divalproex  125 mg Oral Daily   And  . divalproex  250 mg Oral Q2000  . feeding supplement (ENSURE ENLIVE)  237 mL Oral BID BM  . feeding supplement (PRO-STAT SUGAR FREE 64)  30 mL Oral BID  . fentaNYL  25 mcg Transdermal Q72H  . ipratropium-albuterol  3 mL Nebulization TID  . latanoprost  1 drop Right Eye QHS  . multivitamin with minerals  1 tablet Oral Daily  . pantoprazole  40 mg Oral Daily  . polyethylene glycol  17 g Oral Daily  . pravastatin  40 mg Oral Daily  . senna  1 tablet Oral BID  . sodium chloride  3 mL Intravenous Q12H  . thiamine  100 mg Oral Daily   Continuous Infusions: . heparin Stopped (06/06/15 0715)   PRN Meds:.albuterol, ondansetron **OR** ondansetron (ZOFRAN) IV, oxyCODONE, RESOURCE THICKENUP CLEAR    PHYSICAL EXAM: Vital signs in last 24 hours: Filed Vitals:   06/06/15 1935 06/06/15 2151 06/07/15 0546 06/07/15 0745  BP:  120/46 128/52   Pulse: 80 83 73   Temp:  98.9 F (37.2 C) 97.7 F (36.5 C)   TempSrc:  Oral Oral   Resp: $Remo'18 18 20   'ekNFE$ Weight:   90.719 kg (200 lb)   SpO2:  94%  93%      Weight change: 1.361 kg (3 lb) Filed Weights   06/05/15 0922 06/06/15 0601 06/07/15 0546  Weight: 89.359 kg (197 lb) 89.42 kg (197 lb 2.2 oz) 90.719 kg (200 lb)   Body mass index is 26.03 kg/(m^2).   Gen Exam: Awake and alert with clear speech.  Neck: Supple, No JVD.   Chest: B/L Clear.   CVS: S1 S2 Regular, no murmurs.  Abdomen: soft, BS +, non tender, non distended.  Extremities: no edema, lower extremities warm to touch. Neurologic: Quadriparesis with severe bilateral lower extremity weakness and left upper extremity weakness, right upper extremity 4 over 5 strength Skin: No Rash.    Intake/Output from previous day:  Intake/Output Summary (Last 24 hours) at 06/07/15 0943 Last data filed at 06/07/15 0929  Gross per 24 hour  Intake    217 ml  Output   1400 ml  Net  -1183 ml     LAB RESULTS: CBC  Recent Labs Lab 06/03/15 1511 06/04/15 0632 06/05/15 0814 06/06/15 0531 06/07/15 0628  WBC 6.6 7.8 5.6 4.7 6.1  HGB 8.4* 8.6* 7.9* 7.6* 8.4*  HCT 25.9* 28.5* 25.6* 24.9* 27.5*  PLT 327 356 288 291 281  MCV 93.8 96.6 97.7 97.3 98.9  MCH 30.4 29.2 30.2 29.7 30.2  MCHC 32.4 30.2 30.9 30.5 30.5  RDW 15.4 15.6* 15.8* 15.8* 16.0*  LYMPHSABS 0.5* 0.6*  --   --   --   MONOABS 0.5 0.5  --   --   --   EOSABS 0.4 0.3  --   --   --   BASOSABS 0.0 0.0  --   --   --     Chemistries   Recent Labs Lab 06/03/15 1511 06/04/15 0632 06/05/15 0814 06/06/15 0531  NA 142 142 141 147*  K 3.5 3.4* 3.2* 3.5  CL 104 106 105 109  CO2 $Re'25 24 28 30  'wUG$ GLUCOSE 119* 122* 93 101*  BUN 26* 25* 28* 31*  CREATININE 0.97 1.12 1.08 1.15  CALCIUM 8.1* 8.7* 8.4* 8.4*    CBG:  Recent Labs Lab 06/06/15 0740 06/06/15 1152 06/06/15 1645 06/06/15 2142 06/07/15 0809  GLUCAP 107* 119* 115* 127* 118*    GFR Estimated Creatinine Clearance: 61.7 mL/min (by C-G formula based on Cr of 1.15).  Coagulation profile  Recent Labs Lab 06/04/15 0632  INR 3.26*    Cardiac Enzymes  Recent  Labs Lab 06/04/15 0632  TROPONINI <0.03    Invalid input(s): POCBNP No results for input(s): DDIMER in the last 72 hours.  Recent Labs  06/06/15 0531  HGBA1C 6.0*   No results for input(s): CHOL, HDL, LDLCALC, TRIG, CHOLHDL, LDLDIRECT in the last 72 hours. No results for input(s): TSH, T4TOTAL, T3FREE, THYROIDAB in the last 72 hours.  Invalid input(s): FREET3 No results for input(s): VITAMINB12, FOLATE, FERRITIN, TIBC, IRON, RETICCTPCT in the last 72 hours. No results for input(s): LIPASE, AMYLASE in the last 72 hours.  Urine Studies No results for input(s): UHGB, CRYS in the last 72 hours.  Invalid input(s): UACOL, UAPR, USPG, UPH, UTP, UGL, UKET, UBIL, UNIT, UROB, ULEU, UEPI, UWBC, URBC, UBAC, CAST, UCOM, BILUA  MICROBIOLOGY: Recent Results (from the past 240 hour(s))  Blood culture (routine single)     Status: None (Preliminary result)   Collection Time: 06/03/15  2:55 PM  Result Value Ref Range Status   Preliminary Report Blood Culture received; No Growth to date;  Preliminary   Preliminary Report Culture will be held for 5 days before issuing  Preliminary   Preliminary Report a Final Negative report.  Preliminary  Blood culture (routine single)     Status: None (Preliminary result)   Collection Time: 06/03/15  3:09 PM  Result Value Ref Range Status   Preliminary Report Blood Culture received; No Growth to date;  Preliminary   Preliminary Report Culture will be held for 5 days before issuing  Preliminary    Comment: Culture results may be compromised due to an excessive volume of blood received in culture bottles.    Preliminary Report a Final Negative report.  Preliminary  Culture, blood (routine x 2)     Status: None (Preliminary result)   Collection Time: 06/04/15  6:14 AM  Result Value Ref Range Status   Specimen Description BLOOD RIGHT HAND  Final   Special Requests BOTTLES DRAWN AEROBIC AND ANAEROBIC 10CC  Final   Culture NO GROWTH 2 DAYS  Final   Report  Status PENDING  Incomplete  Culture, blood (routine x 2)     Status: None (Preliminary result)   Collection Time: 06/04/15  6:14 AM  Result Value Ref Range Status   Specimen Description BLOOD RIGHT ARM  Final   Special Requests BOTTLES DRAWN AEROBIC ONLY 10CC  Final   Culture NO GROWTH 2 DAYS  Final   Report Status PENDING  Incomplete  Urine culture  Status: None   Collection Time: 06/04/15  6:30 AM  Result Value Ref Range Status   Specimen Description URINE, RANDOM  Final   Special Requests NONE  Final   Culture NO GROWTH 2 DAYS  Final   Report Status 06/06/2015 FINAL  Final  Cath Tip Culture     Status: None   Collection Time: 06/04/15 11:15 AM  Result Value Ref Range Status   Specimen Description CATH TIP  Final   Special Requests L PICC TIP  Final   Culture NO GROWTH Performed at [facility]   Final   Report Status 06/07/2015 FINAL  Final  MRSA PCR Screening     Status: None   Collection Time: 06/04/15 10:18 PM  Result Value Ref Range Status   MRSA by PCR NEGATIVE NEGATIVE Final    Comment:        The GeneXpert MRSA Assay (FDA approved for NASAL specimens only), is one component of a comprehensive MRSA colonization surveillance program. It is not intended to diagnose MRSA infection nor to guide or monitor treatment for MRSA infections.     RADIOLOGY STUDIES/RESULTS: Ct Angio Chest Pe W/cm &/or Wo Cm  06/04/2015  CLINICAL DATA:  Hemoptysis. Hypertension. Hyperlipidemia. Prior CVA. Ex-smoker. EXAM: CT ANGIOGRAPHY CHEST WITH CONTRAST TECHNIQUE: Multidetector CT imaging of the chest was performed using the standard protocol during bolus administration of intravenous contrast. Multiplanar CT image reconstructions and MIPs were obtained to evaluate the vascular anatomy. CONTRAST:  71mL OMNIPAQUE IOHEXOL 350 MG/ML SOLN COMPARISON:  Outside CT dated 04/13/2015. Prior report not submitted. Chest radiograph of 04/27/2015. FINDINGS: Mediastinum/Nodes: Motion and  patient position degraded exam. Arms not raised above the head. No pulmonary embolism to the lobar to large segmental level. A left-sided PICC line which terminates at the left brachiocephalic vein. Aortic and branch vessel atherosclerosis. Mild cardiomegaly. Multivessel coronary artery atherosclerosis. Right paratracheal adenopathy at 1.3 cm (image 36, series 4). This is new since 01/07/2010. Prevascular nodes measure up to the 1.3 cm. Lungs/Pleura: Small to moderate bilateral pleural effusions. Moderate centrilobular emphysema. A 3 mm left lower lobe pulmonary nodule (image 50 for, series 6). Absent in 2011. Apices minimally excluded. Dependent upper and less so lower lobe patchy airspace and interstitial opacities. Upper abdomen: Normal imaged portions of the liver, spleen, pancreas, adrenal glands. gastric diverticulum, (image 92, series 4). Musculoskeletal: Remote left rib trauma. Suspect ankylosing spondylitis throughout the thoracic spine. Review of the MIP images confirms the above findings. IMPRESSION: 1. Motion and position degraded exam. No pulmonary embolism to the large segmental level. 2. Bilateral pleural effusions with bilateral dependent patchy interstitial and airspace opacities. Considerations include pulmonary edema superimposed upon emphysema versus multifocal infection. 3. Adenopathy which could be reactive. Consider followup with chest CT at 3 months to confirm resolution. 4.  Atherosclerosis, including within the coronary arteries. 5. Gastric diverticulum. 6. Probable ankylosing spondylitis. 7. Left lung base nodule. Given risk factors for bronchogenic carcinoma, follow-up chest CT at 1 year is recommended. This recommendation follows the consensus statement: "Guidelines for Management of Small Pulmonary Nodules Detected on CT Scans: A Statement from the [place]" as published in Radiology 2005; 237:395-400. Available online at:  [email or web address].arnold.com/. 8. Left-sided PICC line terminating at the left brachiocephalic vein. Electronically Signed   By: Abigail Miyamoto M.D.   On: 06/04/2015 07:53   Ir US Guide Vasc Access Left  05/23/2015  INDICATION: Bacteremia and previous right PICC line malfunctioning. Request for new left arm peripherally inserted central catheter for antibiotics.  EXAM: ULTRASOUND AND FLUOROSCOPIC GUIDED PICC LINE INSERTION MEDICATIONS: None. CONTRAST:  None FLUOROSCOPY TIME:  18 seconds. COMPLICATIONS: None immediate TECHNIQUE: The procedure, risks, benefits, and alternatives were explained to the patient's wife and informed written consent was obtained. A timeout was performed prior to the initiation of the procedure. The left upper extremity was prepped with chlorhexidine in a sterile fashion, and a sterile drape was applied covering the operative field. Maximum barrier sterile technique with sterile gowns and gloves were used for the procedure. A timeout was performed prior to the initiation of the procedure. Local anesthesia was provided with 1% lidocaine. Under direct ultrasound guidance, the left brachial vein was accessed with a micropuncture kit after the overlying soft tissues were anesthetized with 1% lidocaine. An ultrasound image was saved for documentation purposes. A guidewire was advanced to the level of the superior caval-atrial junction for measurement purposes and the PICC line was cut to length. A peel-away sheath was placed and a 44 cm, 5 [place], single lumen was inserted to level of the superior caval-atrial junction. A post procedure spot fluoroscopic was obtained. The catheter easily aspirated and flushed and was sutured in place. A dressing was placed. The patient tolerated the procedure well without immediate post procedural complication. FINDINGS: After catheter placement, the tip lies within the superior cavoatrial junction. The catheter aspirates and flushes  normally and is ready for immediate use. IMPRESSION: Successful ultrasound and fluoroscopic guided placement of a left brachial vein approach, 44 cm, 5 [place], single lumen PICC with tip at the superior caval-atrial junction. The PICC line is ready for immediate use. Read By:  Tsosie Billing PA-C Electronically Signed   By: Aletta Edouard M.D.   On: 05/23/2015 10:35   Dg Chest Port 1 View  06/07/2015  CLINICAL DATA:  Shortness of breath, hemoptysis, dementia EXAM: PORTABLE CHEST 1 VIEW COMPARISON:  06/06/2015 FINDINGS: Multifocal patchy opacities in the bilateral upper lobes, left greater than right. Suspected small left pleural effusion. No pneumothorax. Cardiomegaly. Cervical spine fixation hardware. IMPRESSION: Multifocal patchy opacities in the bilateral upper lobes, suspicious for pneumonia, or less likely interstitial edema. Suspected small left pleural effusion. Electronically Signed   By: Julian Hy M.D.   On: 06/07/2015 09:04   Dg Chest Port 1 View  06/06/2015  CLINICAL DATA:  Shortness of breath. EXAM: PORTABLE CHEST 1 VIEW COMPARISON:  06/05/2015 FINDINGS: Cardiac silhouette is upper limits of normal in size, unchanged. Coarse interstitial and airspace opacities in both lungs with an upper lobe predominance have not significantly changed. There are likely persistent small bilateral pleural effusions. No pneumothorax. IMPRESSION: Unchanged bilateral lung opacities which may reflect multifocal pneumonia or edema. Electronically Signed   By: Logan Bores M.D.   On: 06/06/2015 07:10   Dg Chest Port 1 View  06/05/2015  CLINICAL DATA:  Shortness of Breath EXAM: PORTABLE CHEST - 1 VIEW COMPARISON:  CT from the previous day, and earlier studies FINDINGS: coarse airspace opacities in the upper lobes left greater than right, which were present on the prior CT, not convincingly changed in distribution or severity. Heart size upper limits normal for technique. Small pleural effusions. No  pneumothorax. Orthopedic hardware in the right humeral head and lower cervical spine, incompletely visualized. IMPRESSION: 1. Little change in asymmetric infiltrates or edema with upper lobe predominance, left greater than right. 2. Persistent small effusions. Electronically Signed   By: Lucrezia Europe M.D.   On: 06/05/2015 08:53   Ir Fluoro Guide Cv Midline Picc Left  05/23/2015  INDICATION: Bacteremia and previous right PICC line malfunctioning. Request for new left arm peripherally inserted central catheter for antibiotics. EXAM: ULTRASOUND AND FLUOROSCOPIC GUIDED PICC LINE INSERTION MEDICATIONS: None. CONTRAST:  None FLUOROSCOPY TIME:  18 seconds. COMPLICATIONS: None immediate TECHNIQUE: The procedure, risks, benefits, and alternatives were explained to the patient's wife and informed written consent was obtained. A timeout was performed prior to the initiation of the procedure. The left upper extremity was prepped with chlorhexidine in a sterile fashion, and a sterile drape was applied covering the operative field. Maximum barrier sterile technique with sterile gowns and gloves were used for the procedure. A timeout was performed prior to the initiation of the procedure. Local anesthesia was provided with 1% lidocaine. Under direct ultrasound guidance, the left brachial vein was accessed with a micropuncture kit after the overlying soft tissues were anesthetized with 1% lidocaine. An ultrasound image was saved for documentation purposes. A guidewire was advanced to the level of the superior caval-atrial junction for measurement purposes and the PICC line was cut to length. A peel-away sheath was placed and a 44 cm, 5 [place], single lumen was inserted to level of the superior caval-atrial junction. A post procedure spot fluoroscopic was obtained. The catheter easily aspirated and flushed and was sutured in place. A dressing was placed. The patient tolerated the procedure well without immediate post procedural  complication. FINDINGS: After catheter placement, the tip lies within the superior cavoatrial junction. The catheter aspirates and flushes normally and is ready for immediate use. IMPRESSION: Successful ultrasound and fluoroscopic guided placement of a left brachial vein approach, 44 cm, 5 [place], single lumen PICC with tip at the superior caval-atrial junction. The PICC line is ready for immediate use. Read By:  Tsosie Billing PA-C Electronically Signed   By: Aletta Edouard M.D.   On: 05/23/2015 10:35    Thurnell Lose, MD  [facility] Pager:[phone]  If 7PM-7AM, please contact night-coverage [email or web address] Password [email or web address] 06/07/2015, 9:43 AM   LOS: 3 days

## 2015-06-07 NOTE — Progress Notes (Signed)
Patient coughed up 30cc of thick, sticky blood. Prior to this, no other episodes of hemoptysis since admission. MD notified and aware. Will continue to monitor.   Janell QuietKayla Price, RN

## 2015-06-07 NOTE — Discharge Instructions (Signed)
You have a Left lung base nodule: Given risk factors for lung cancer,please ask you primary MD to repeat a follow-up chest CT at 1 year.  Follow with Primary MD HAMRICK,MAURA L, MD in 7 days   Get CBC, CMP, 2 view Chest X ray checked  by Primary MD next visit.    Activity: As tolerated with Full fall precautions use walker/cane & assistance as needed   Disposition SNF   Diet: Dysphagia 3 diet nectar thick liquids with full feeding assistance and aspiration precautions.   For Heart failure patients - Check your Weight same time everyday, if you gain over 2 pounds, or you develop in leg swelling, experience more shortness of breath or chest pain, call your Primary MD immediately. Follow Cardiac Low Salt Diet and 1.5 lit/day fluid restriction.   On your next visit with your primary care physician please Get Medicines reviewed and adjusted.   Please request your Prim.MD to go over all Hospital Tests and Procedure/Radiological results at the follow up, please get all Hospital records sent to your Prim MD by signing hospital release before you go home.   If you experience worsening of your admission symptoms, develop shortness of breath, life threatening emergency, suicidal or homicidal thoughts you must seek medical attention immediately by calling 911 or calling your MD immediately  if symptoms less severe.  You Must read complete instructions/literature along with all the possible adverse reactions/side effects for all the Medicines you take and that have been prescribed to you. Take any new Medicines after you have completely understood and accpet all the possible adverse reactions/side effects.   Do not drive, operating heavy machinery, perform activities at heights, swimming or participation in water activities or provide baby sitting services if your were admitted for syncope or siezures until you have seen by Primary MD or a Neurologist and advised to do so again.  Do not drive when  taking Pain medications.    Do not take more than prescribed Pain, Sleep and Anxiety Medications  Special Instructions: If you have smoked or chewed Tobacco  in the last 2 yrs please stop smoking, stop any regular Alcohol  and or any Recreational drug use.  Wear Seat belts while driving.   Please note  You were cared for by a hospitalist during your hospital stay. If you have any questions about your discharge medications or the care you received while you were in the hospital after you are discharged, you can call the unit and asked to speak with the hospitalist on call if the hospitalist that took care of you is not available. Once you are discharged, your primary care physician will handle any further medical issues. Please note that NO REFILLS for any discharge medications will be authorized once you are discharged, as it is imperative that you return to your primary care physician (or establish a relationship with a primary care physician if you do not have one) for your aftercare needs so that they can reassess your need for medications and monitor your lab values.

## 2015-06-07 NOTE — Progress Notes (Signed)
Critical care MD called regarding central line placement.  They are unable to place one tonight unless it is emergent.  Charles Pastelom [name] NP notified and will address with day time practitioners. One time dose of Lovenox already ordered to cover anticoagulation.  Pharmacy notified of situation.  Mr Charles Bridges is on 4L of 02 and is no distress at the present time.  No cough, sputum or respiratory distress noted.

## 2015-06-07 NOTE — Progress Notes (Signed)
ANTICOAGULATION CONSULT NOTE  Pharmacy Consult for Heparin  Indication: DVT  No Known Allergies  Patient Measurements: Height: 6' (182.9 cm) Weight: 200 lb (90.719 kg) IBW/kg (Calculated) : 77.6  Vital Signs: Temp: 98 F (36.7 C) (11/12 2208) Temp Source: Oral (11/12 2208) BP: 118/36 mmHg (11/12 2208) Pulse Rate: 80 (11/12 2208)  Labs:  Recent Labs  06/05/15 0814  06/05/15 2103 06/06/15 0531 06/07/15 0628 06/07/15 2231  HGB 7.9*  --   --  7.6* 8.4*  --   HCT 25.6*  --   --  24.9* 27.5*  --   PLT 288  --   --  291 281  --   APTT  --   --  106* 105*  --   --   HEPARINUNFRC  --   < > 0.87* 0.68 0.47 0.30  CREATININE 1.08  --   --  1.15  --   --   < > = values in this interval not displayed.  Estimated Creatinine Clearance: 59 mL/min (by C-G formula based on Cr of 1.15).   Assessment: 79 y.o. male with RUE DVT for heparin Goal of Therapy:  Heparin level 0.3-0.5 units/ml (lower end of range requested by MD) Monitor platelets by anticoagulation protocol: Yes   Plan:  -Continue Heparin at current rate  Follow-up am labs.    Eddie CandleAbbott, Darletta Noblett Vernon 06/07/2015,11:00 PM

## 2015-06-07 NOTE — Progress Notes (Signed)
Name: Charles Bridges MRN: [id number] DOB: 1936-06-21    ADMISSION DATE:  06/04/2015 CONSULTATION DATE:  11/11  REFERRING MD : [facility] CHIEF COMPLAINT:  FTT     HISTORY OF PRESENT ILLNESS:   Unfortunate 79 yo wm smoker age [age], who suffered a fall in September 2016 resulting in central cord syndrome despite surgical decompression. He is non ambulatory and on anticoagulation for RUE thrombus from PICC line along with CVA history. Further he suffers from dementia and is almost deaf. He was residing in [facility] when on 11/10 he developed hemoptysis (on [place]). He has been treated with long term abx per ID staph bacteremia. Admitted 11/10 with new hemoptysis, desaturation(70's) and was admitted to floor. Currently on no abx and in no acute distress other than he can't hear. Urine culture was negative and BC x 2 are pending. He is on heparin drip without further hemoptysis. CxR remains unchanged with bilateral lung opacities.      No Known Allergies  FAMILY HISTORY:  family history includes Cancer in his mother; Other in his father and mother. SOCIAL HISTORY:  reports that he quit smoking about 15 years ago. His smoking use included Cigarettes. He has never used smokeless tobacco. He reports that he does not drink alcohol or use illicit drugs.  REVIEW OF SYSTEMS:   10 point review of system taken, please see HPI for positives and negatives.   SUBJECTIVE:  No further hemoptysis/ nad on NP 4lpm   VITAL SIGNS: Temp:  [97.7 F (36.5 C)-99.1 F (37.3 C)] 97.7 F (36.5 C) (11/12 0546) Pulse Rate:  [73-89] 73 (11/12 0546) Resp:  [18-20] 20 (11/12 0546) BP: (120-128)/(46-52) 128/52 mmHg (11/12 0546) SpO2:  [92 %-94 %] 93 % (11/12 0745) Weight:  [200 lb (90.719 kg)] 200 lb (90.719 kg) (11/12 0546)  PHYSICAL EXAMINATION: General:  Frail  elderly, demented, deaf wm in no distress. Neuro:  HOH, left cva, central cord syndrome. Follows commads with with upper ext r>l HEENT:   No Jvd, Oral mucosa wet Cardiovascular: HSD Lungs:  Decreased bs bases, poor air movement Abdomen:  Obese + bs Musculoskeletal: Warm Lower ext boots Skin:  warm   Recent Labs Lab 06/04/15 0632 06/05/15 0814 06/06/15 0531  NA 142 141 147*  K 3.4* 3.2* 3.5  CL 106 105 109  CO2 BUN 25* 28* 31*  CREATININE 1.12 1.08 1.15  GLUCOSE 122* 93 101*    Recent Labs Lab 06/05/15 0814 06/06/15 0531 06/07/15 0628  HGB 7.9* 7.6* 8.4*  HCT 25.6* 24.9* 27.5*  WBC 5.6 4.7 6.1  PLT 288 291 281   Dg Chest Port 1 View  06/07/2015  CLINICAL DATA:  Shortness of breath, hemoptysis, dementia EXAM: PORTABLE CHEST 1 VIEW COMPARISON:  06/06/2015 FINDINGS: Multifocal patchy opacities in the bilateral upper lobes, left greater than right. Suspected small left pleural effusion. No pneumothorax. Cardiomegaly. Cervical spine fixation hardware. IMPRESSION: Multifocal patchy opacities in the bilateral upper lobes, suspicious for pneumonia, or less likely interstitial edema. Suspected small left pleural effusion. Electronically Signed   By: Charline Bills M.D.   On: 06/07/2015 09:04   Dg Chest Port 1 View  06/06/2015  CLINICAL DATA:  Shortness of breath. EXAM: PORTABLE CHEST 1 VIEW COMPARISON:  06/05/2015 FINDINGS: Cardiac silhouette is upper limits of normal in size, unchanged. Coarse interstitial and airspace opacities in both lungs with an upper lobe predominance have not significantly changed. There are likely persistent small bilateral pleural effusions. No pneumothorax.  IMPRESSION: Unchanged bilateral lung opacities which may reflect multifocal pneumonia or edema. Electronically Signed   By: Sebastian AcheAllen  Grady M.D.   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   rec Leave off anticogulants unless there is a definite indication and if there is consider low-dose Eliquis eg 2.5 mg bid as an option.  Note bnp and PCT indeterminate range so needs close f/u thru w/e for flare of chf or clinical evidence of infection as at risk in bed bound condition esp for aspiration/hcap     Sandrea HughsMichael Davie Sagona, MD Pulmonary and Critical Care Medicine [facility] Cell [phone] After 5:30 PM or weekends, call [phone]

## 2015-06-08 ENCOUNTER — Inpatient Hospital Stay (HOSPITAL_COMMUNITY): Payer: Medicare Other

## 2015-06-08 DIAGNOSIS — M6289 Other specified disorders of muscle: Secondary | ICD-10-CM

## 2015-06-08 DIAGNOSIS — S14123A Central cord syndrome at C3 level of cervical spinal cord, initial encounter: Secondary | ICD-10-CM

## 2015-06-08 DIAGNOSIS — Z86718 Personal history of other venous thrombosis and embolism: Secondary | ICD-10-CM

## 2015-06-08 DIAGNOSIS — D6489 Other specified anemias: Secondary | ICD-10-CM

## 2015-06-08 DIAGNOSIS — J410 Simple chronic bronchitis: Secondary | ICD-10-CM

## 2015-06-08 LAB — CBC
HCT: 26.8 % — ABNORMAL LOW (ref 39.0–52.0)
HEMOGLOBIN: 8.3 g/dL — AB (ref 13.0–17.0)
MCH: 30.2 pg (ref 26.0–34.0)
MCHC: 31 g/dL (ref 30.0–36.0)
MCV: 97.5 fL (ref 78.0–100.0)
Platelets: 282 10*3/uL (ref 150–400)
RBC: 2.75 MIL/uL — AB (ref 4.22–5.81)
RDW: 16 % — ABNORMAL HIGH (ref 11.5–15.5)
WBC: 10.2 10*3/uL (ref 4.0–10.5)

## 2015-06-08 LAB — BASIC METABOLIC PANEL
ANION GAP: 8 (ref 5–15)
BUN: 20 mg/dL (ref 6–20)
CHLORIDE: 108 mmol/L (ref 101–111)
CO2: 28 mmol/L (ref 22–32)
Calcium: 8.4 mg/dL — ABNORMAL LOW (ref 8.9–10.3)
Creatinine, Ser: 1.03 mg/dL (ref 0.61–1.24)
GFR calc Af Amer: >60 mL/min (ref >60)
GFR calc non Af Amer: >60 mL/min (ref >60)
GLUCOSE: 91 mg/dL (ref 65–99)
POTASSIUM: 3.4 mmol/L — AB (ref 3.5–5.1)
Sodium: 144 mmol/L (ref 135–145)

## 2015-06-08 LAB — HEPARIN LEVEL (UNFRACTIONATED)
Heparin Unfractionated: 0.29 IU/mL — ABNORMAL LOW (ref 0.30–0.70)
Heparin Unfractionated: <0.1 IU/mL — ABNORMAL LOW (ref 0.30–0.70)

## 2015-06-08 LAB — MAGNESIUM: MAGNESIUM: 1.9 mg/dL (ref 1.7–2.4)

## 2015-06-08 MED ORDER — HEPARIN SODIUM (PORCINE) 5000 UNIT/ML IJ SOLN
5000.0000 [IU] | Freq: Three times a day (TID) | INTRAMUSCULAR | Status: DC
  Administered 2015-06-08 – 2015-06-10 (×8): 5000 [IU] via SUBCUTANEOUS
  Filled 2015-06-08 (×6): qty 1

## 2015-06-08 MED ORDER — POTASSIUM CHLORIDE 20 MEQ/15ML (10%) PO SOLN
40.0000 meq | Freq: Once | ORAL | Status: AC
  Administered 2015-06-08: 40 meq via ORAL
  Filled 2015-06-08: qty 30

## 2015-06-08 NOTE — Progress Notes (Signed)
PATIENT DETAILS Name: Charles Bridges Age: 79 y.o. Sex: male Date of Birth: Aug 01, 1935 Admit Date: 06/04/2015 Admitting Physician Waldemar Dickens, MD JOI:NOMVEHM,CNOBS L, MD   Brief narrative  79 year old male with history of prior CVA, history of carotid artery occlusion status post CEA, EtOH use, recent C4-5 fracture with cord edema-and resultant central cord syndrome (September 2016), recent history of coagulase neg staph bacteremia on IV vancomycin, history of right upper extremity DVT (likely from PICC) on anticoagulation-presented to the hospital for evaluation of acute hypoxic respiratory failure and hemoptysis.  Subjective:  Patient in bed, no headache, no chest- abd pain, improved SOB, no hemoptysis since 06/05/2015.  Assessment/Plan:   Acute hypoxic respiratory failure with bilateral pulmonary infiltrates and hemoptysis: Recent echogram done one month ago shows EF of 60% with no evidence of LVH.   Could be mild fluid overload, he was afebrile with a normal pro-calcitonin, no leukocytosis, speech following to minimize the chance of aspiration. Has been adequately diuresed and currently off diuretics. Current weight is 200 pounds and his baseline dry weight appears to be 217 pounds. Pulmonary critical care on board. Currently not on antibiotics. Continue supportive care with pulmonary critical care to address this issue of bilateral infiltrates and acute hypoxic respiratory failure. In the present clinical setting it appears that present infiltrates could be due to pulmonary hemorrhage from anticoagulation. Clinically improving continue supportive care.  Hemoptysis: Secondary to above-none since since his provoked right upper extremity DVT from the previous PICC line has resolved on repeat ultrasound case discussed with pulmonary physician Dr. Madlyn Frankel and for now all anticoagulation will be stopped except prophylactic dose heparin.  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 Plan as above.  Recent history of coagulase negative bacteremia: ID consulted-recommendations are to continue to monitor off antibiotics. PICC line has been removed, catheter tip culture and blood cultures negative to date. Afebrile and without leukocytosis.  Acute on chronic encephalopathy: Suspect some amount of alcohol related encephalopathy at mild delirium due to acute sickness and hospitalization. Continue with Depakote and Supportive care.  Anemia: Suspect secondary to acute illness with anemia from chronic disease -sent minimal hemoptysis, monitor no need for transfusion.  Hx of Cord compression with Central cord syndrome/quadriparesis (Sept 2016):Following a mechanical fall and resultant C4-5 fracture with cord edema C3 -Patient S/p C3,4,5,and C6 laminectomies, Posterior arthrodesis C3-6, morselized allograft by Dr. Cyndy Freeze  on 04/15/2015. His right upper extremity around 3-4/5, left upper extremity around 1-2/5, bilateral lower extremities around 3/5.  Chronic indwelling Foley catheter: Removed on admission-voiding trial failed 11/10-as patient developed acute urinary retention-foley reinserted  Essential hypertension: Continue to hold all antihypertensives-BP, Will monitor.  History of CVA/carotid artery disease-status post carotid endarterectomy: Not on aspirin-as an anticoagulation, has significant deficits as outlined above due to central cord syndrome following a mechanical fall   ? Prior history of atrial fibrillation:   On IV heparin-telemetry shows sinus rhythm-hold metoprolol for now.  Dyslipidemia: Continue statin  History of peptic ulcer disease: Continue with PPI, is s/p vagotomy.  History of alcohol abuse: Resident of SNF since his discharge September this year-doubt any ongoing alcohol use  Bilateral lower extremity/heel pressure ulcers: Seen by wound  care RN-recommendations are foam dressing to affected areas, continue Prevalon boots  Left lung base nodule: Given risk factors for bronchogenic  carcinoma, follow-up chest CT at 1 year is recommended-defer to outpatient setting  ? History of DM: Monitor CBGs off sliding scale  Lab Results  Component Value Date   HGBA1C 6.0* 06/06/2015    CBG (last 3)   Recent Labs  06/07/15 1147 06/07/15 1657 06/07/15 2204  GLUCAP 112* 107* 120*       Disposition: Remain inpatient-Back to SNF when able  Anti-infectives    None      DVT Prophylaxis: Heparin    Code Status: Partial Code-DNI  Family Communication None at baseline  Procedures:  CT chest, right upper quadrant venous duplex  CONSULTS:  Pulmonary critical care   Time spent 25 minutes-Greater than 50% of this time was spent in counseling, explanation of diagnosis, planning of further management, and coordination of care.  MEDICATIONS: Scheduled Meds: . antiseptic oral rinse  7 mL Mouth Rinse BID  . divalproex  125 mg Oral Daily   And  . divalproex  250 mg Oral Q2000  . feeding supplement (ENSURE ENLIVE)  237 mL Oral BID BM  . feeding supplement (PRO-STAT SUGAR FREE 64)  30 mL Oral BID  . fentaNYL  25 mcg Transdermal Q72H  . heparin subcutaneous  5,000 Units Subcutaneous 3 times per day  . ipratropium-albuterol  3 mL Nebulization TID  . latanoprost  1 drop Right Eye QHS  . multivitamin with minerals  1 tablet Oral Daily  . pantoprazole  40 mg Oral Daily  . polyethylene glycol  17 g Oral Daily  . potassium chloride  40 mEq Oral Once  . pravastatin  40 mg Oral Daily  . senna  1 tablet Oral BID  . thiamine  100 mg Oral Daily   Continuous Infusions:   PRN Meds:.albuterol, [DISCONTINUED] ondansetron **OR** ondansetron (ZOFRAN) IV, oxyCODONE, RESOURCE THICKENUP CLEAR    PHYSICAL EXAM: Vital signs in last 24 hours: Filed Vitals:   06/07/15 1317 06/07/15 2126 06/07/15 2208 06/08/15 0047  BP:   118/36    Pulse:   80   Temp:   98 F (36.7 C)   TempSrc:   Oral   Resp:   20   Height:   6' (1.829 m)   Weight:    90.9 kg (200 lb 6.4 oz)  SpO2: 94% 92% 100%     Weight change: 0.181 kg (6.4 oz) Filed Weights   06/06/15 0601 06/07/15 0546 06/08/15 0047  Weight: 89.42 kg (197 lb 2.2 oz) 90.719 kg (200 lb) 90.9 kg (200 lb 6.4 oz)   Body mass index is 27.17 kg/(m^2).   Gen Exam: Awake and alert with clear speech.  Neck: Supple, No JVD.   Chest: B/L Clear.   CVS: S1 S2 Regular, no murmurs.  Abdomen: soft, BS +, non tender, non distended.  Extremities: no edema, lower extremities warm to touch. Neurologic: Quadriparesis with severe bilateral lower extremity weakness and left upper extremity weakness, right upper extremity 4 over 5 strength Skin: No Rash.    Intake/Output from previous day:  Intake/Output Summary (Last 24 hours) at 06/08/15 0925 Last data filed at 06/08/15 0758  Gross per 24 hour  Intake    120 ml  Output   1050 ml  Net   -930 ml     LAB RESULTS: CBC  Recent Labs Lab 06/03/15 1511 06/04/15 0632 06/05/15 0814 06/06/15 0531 06/07/15 0628 06/08/15 0530  WBC 6.6 7.8 5.6 4.7 6.1 10.2  HGB 8.4* 8.6* 7.9* 7.6* 8.4* 8.3*  HCT 25.9* 28.5* 25.6* 24.9* 27.5* 26.8*  PLT 327 356 288 291 281 282  MCV 93.8 96.6 97.7 97.3 98.9 97.5  MCH 30.4 29.2 30.2 29.7 30.2 30.2  MCHC 32.4 30.2 30.9 30.5 30.5 31.0  RDW 15.4 15.6* 15.8* 15.8* 16.0* 16.0*  LYMPHSABS 0.5* 0.6*  --   --   --   --   MONOABS 0.5 0.5  --   --   --   --   EOSABS 0.4 0.3  --   --   --   --   BASOSABS 0.0 0.0  --   --   --   --     Chemistries   Recent Labs Lab 06/03/15 1511 06/04/15 0632 06/05/15 0814 06/06/15 0531 06/08/15 0530  NA 142 142 141 147* 144  K 3.5 3.4* 3.2* 3.5 3.4*  CL 104 106 105 109 108  CO2 _0 GLUCOSE 119* 122* 93 101* 91  BUN 26* 25* 28* 31* 20  CREATININE 0.97 1.12 1.08 1.15 1.03  CALCIUM 8.1* 8.7* 8.4* 8.4* 8.4*  MG  --   --   --   --  1.9     CBG:  Recent Labs Lab 06/06/15 2142 06/07/15 0809 06/07/15 1147 06/07/15 1657 06/07/15 2204  GLUCAP 127* 118* 112* 107* 120*    GFR Estimated Creatinine Clearance: 65.9 mL/min (by C-G formula based on Cr of 1.03).  Coagulation profile  Recent Labs Lab 06/04/15 0632  INR 3.26*    Cardiac Enzymes  Recent Labs Lab 06/04/15 0632  TROPONINI <0.03    Invalid input(s): POCBNP No results for input(s): DDIMER in the last 72 hours.  Recent Labs  06/06/15 0531  HGBA1C 6.0*   No results for input(s): CHOL, HDL, LDLCALC, TRIG, CHOLHDL, LDLDIRECT in the last 72 hours. No results for input(s): TSH, T4TOTAL, T3FREE, THYROIDAB in the last 72 hours.  Invalid input(s): FREET3 No results for input(s): VITAMINB12, FOLATE, FERRITIN, TIBC, IRON, RETICCTPCT in the last 72 hours. No results for input(s): LIPASE, AMYLASE in the last 72 hours.  Urine Studies No results for input(s): UHGB, CRYS in the last 72 hours.  Invalid input(s): UACOL, UAPR, USPG, UPH, UTP, UGL, UKET, UBIL, UNIT, UROB, ULEU, UEPI, UWBC, URBC, UBAC, CAST, UCOM, BILUA  MICROBIOLOGY: Recent Results (from the past 240 hour(s))  Blood culture (routine single)     Status: None (Preliminary result)   Collection Time: 06/03/15  2:55 PM  Result Value Ref Range Status   Preliminary Report Blood Culture received; No Growth to date;  Preliminary   Preliminary Report Culture will be held for 5 days before issuing  Preliminary   Preliminary Report a Final Negative report.  Preliminary  Blood culture (routine single)     Status: None (Preliminary result)   Collection Time: 06/03/15  3:09 PM  Result Value Ref Range Status   Preliminary Report Blood Culture received; No Growth to date;  Preliminary   Preliminary Report Culture will be held for 5 days before issuing  Preliminary    Comment: Culture results may be compromised due to an excessive volume of blood received in culture bottles.    Preliminary Report a  Final Negative report.  Preliminary  Culture, blood (routine x 2)     Status: None (Preliminary result)   Collection Time: 06/04/15  6:14 AM  Result Value Ref Range Status   Specimen Description BLOOD RIGHT HAND  Final   Special Requests BOTTLES DRAWN AEROBIC AND ANAEROBIC 10CC  Final   Culture NO GROWTH 3 DAYS  Final   Report Status PENDING  Incomplete  Culture, blood (routine x 2)     Status: None (Preliminary result)   Collection Time: 06/04/15  6:14 AM  Result Value Ref Range Status   Specimen Description BLOOD RIGHT ARM  Final   Special Requests BOTTLES DRAWN AEROBIC ONLY 10CC  Final   Culture NO GROWTH 3 DAYS  Final   Report Status PENDING  Incomplete  Urine culture     Status: None   Collection Time: 06/04/15  6:30 AM  Result Value Ref Range Status   Specimen Description URINE, RANDOM  Final   Special Requests NONE  Final   Culture NO GROWTH 2 DAYS  Final   Report Status 06/06/2015 FINAL  Final  Cath Tip Culture     Status: None   Collection Time: 06/04/15 11:15 AM  Result Value Ref Range Status   Specimen Description CATH TIP  Final   Special Requests L PICC TIP  Final   Culture NO GROWTH Performed at [facility]   Final   Report Status 06/07/2015 FINAL  Final  MRSA PCR Screening     Status: None   Collection Time: 06/04/15 10:18 PM  Result Value Ref Range Status   MRSA by PCR NEGATIVE NEGATIVE Final    Comment:        The GeneXpert MRSA Assay (FDA approved for NASAL specimens only), is one component of a comprehensive MRSA colonization surveillance program. It is not intended to diagnose MRSA infection nor to guide or monitor treatment for MRSA infections.     RADIOLOGY STUDIES/RESULTS: Ct Angio Chest Pe W/cm &/or Wo Cm  06/04/2015  CLINICAL DATA:  Hemoptysis. Hypertension. Hyperlipidemia. Prior CVA. Ex-smoker. EXAM: CT ANGIOGRAPHY CHEST WITH CONTRAST TECHNIQUE: Multidetector CT imaging of the chest was performed using the standard protocol during  bolus administration of intravenous contrast. Multiplanar CT image reconstructions and MIPs were obtained to evaluate the vascular anatomy. CONTRAST:  22m OMNIPAQUE IOHEXOL 350 MG/ML SOLN COMPARISON:  Outside CT dated 04/13/2015. Prior report not submitted. Chest radiograph of 04/27/2015. FINDINGS: Mediastinum/Nodes: Motion and patient position degraded exam. Arms not raised above the head. No pulmonary embolism to the lobar to large segmental level. A left-sided PICC line which terminates at the left brachiocephalic vein. Aortic and branch vessel atherosclerosis. Mild cardiomegaly. Multivessel coronary artery atherosclerosis. Right paratracheal adenopathy at 1.3 cm (image 36, series 4). This is new since 01/07/2010. Prevascular nodes measure up to the 1.3 cm. Lungs/Pleura: Small to moderate bilateral pleural effusions. Moderate centrilobular emphysema. A 3 mm left lower lobe pulmonary nodule (image 50 for, series 6). Absent in 2011. Apices minimally excluded. Dependent upper and less so lower lobe patchy airspace and interstitial opacities. Upper abdomen: Normal imaged portions of the liver, spleen, pancreas, adrenal glands. gastric diverticulum, (image 92, series 4). Musculoskeletal: Remote left rib trauma. Suspect ankylosing spondylitis throughout the thoracic spine. Review of the MIP images confirms the above findings. IMPRESSION: 1. Motion and position degraded exam. No pulmonary embolism to the large segmental level. 2. Bilateral pleural effusions with bilateral dependent patchy interstitial and airspace opacities. Considerations include pulmonary edema superimposed upon emphysema versus multifocal infection. 3. Adenopathy which could be reactive. Consider followup with chest CT at 3 months to confirm resolution. 4.  Atherosclerosis, including within the coronary arteries. 5. Gastric diverticulum. 6. Probable ankylosing spondylitis. 7. Left lung base nodule. Given risk factors for bronchogenic carcinoma,  follow-up chest CT at 1 year is recommended. This recommendation follows the consensus statement: "Guidelines for  Management of Small Pulmonary Nodules Detected on CT Scans: A Statement from the [employer]" as published in Radiology 2005; 237:395-400. Available online at: [email or web address].com/. 8. Left-sided PICC line terminating at the left brachiocephalic vein. Electronically Signed   By: Abigail Miyamoto M.D.   On: 06/04/2015 07:53   Ir US Guide Vasc Access Left  05/23/2015  INDICATION: Bacteremia and previous right PICC line malfunctioning. Request for new left arm peripherally inserted central catheter for antibiotics. EXAM: ULTRASOUND AND FLUOROSCOPIC GUIDED PICC LINE INSERTION MEDICATIONS: None. CONTRAST:  None FLUOROSCOPY TIME:  18 seconds. COMPLICATIONS: None immediate TECHNIQUE: The procedure, risks, benefits, and alternatives were explained to the patient's wife and informed written consent was obtained. A timeout was performed prior to the initiation of the procedure. The left upper extremity was prepped with chlorhexidine in a sterile fashion, and a sterile drape was applied covering the operative field. Maximum barrier sterile technique with sterile gowns and gloves were used for the procedure. A timeout was performed prior to the initiation of the procedure. Local anesthesia was provided with 1% lidocaine. Under direct ultrasound guidance, the left brachial vein was accessed with a micropuncture kit after the overlying soft tissues were anesthetized with 1% lidocaine. An ultrasound image was saved for documentation purposes. A guidewire was advanced to the level of the superior caval-atrial junction for measurement purposes and the PICC line was cut to length. A peel-away sheath was placed and a 44 cm, 5 [place], single lumen was inserted to level of the superior caval-atrial junction. A post procedure spot fluoroscopic was obtained. The catheter easily  aspirated and flushed and was sutured in place. A dressing was placed. The patient tolerated the procedure well without immediate post procedural complication. FINDINGS: After catheter placement, the tip lies within the superior cavoatrial junction. The catheter aspirates and flushes normally and is ready for immediate use. IMPRESSION: Successful ultrasound and fluoroscopic guided placement of a left brachial vein approach, 44 cm, 5 [place], single lumen PICC with tip at the superior caval-atrial junction. The PICC line is ready for immediate use. Read By:  Tsosie Billing PA-C Electronically Signed   By: Aletta Edouard M.D.   On: 05/23/2015 10:35   Dg Chest Port 1 View  06/07/2015  CLINICAL DATA:  Shortness of breath, hemoptysis, dementia EXAM: PORTABLE CHEST 1 VIEW COMPARISON:  06/06/2015 FINDINGS: Multifocal patchy opacities in the bilateral upper lobes, left greater than right. Suspected small left pleural effusion. No pneumothorax. Cardiomegaly. Cervical spine fixation hardware. IMPRESSION: Multifocal patchy opacities in the bilateral upper lobes, suspicious for pneumonia, or less likely interstitial edema. Suspected small left pleural effusion. Electronically Signed   By: Julian Hy M.D.   On: 06/07/2015 09:04   Dg Chest Port 1 View  06/06/2015  CLINICAL DATA:  Shortness of breath. EXAM: PORTABLE CHEST 1 VIEW COMPARISON:  06/05/2015 FINDINGS: Cardiac silhouette is upper limits of normal in size, unchanged. Coarse interstitial and airspace opacities in both lungs with an upper lobe predominance have not significantly changed. There are likely persistent small bilateral pleural effusions. No pneumothorax. IMPRESSION: Unchanged bilateral lung opacities which may reflect multifocal pneumonia or edema. Electronically Signed   By: Logan Bores M.D.   On: 06/06/2015 07:10   Dg Chest Port 1 View  06/05/2015  CLINICAL DATA:  Shortness of Breath EXAM: PORTABLE CHEST - 1 VIEW COMPARISON:  CT from the  previous day, and earlier studies FINDINGS: coarse airspace opacities in the upper lobes left greater than right, which were present on the prior  CT, not convincingly changed in distribution or severity. Heart size upper limits normal for technique. Small pleural effusions. No pneumothorax. Orthopedic hardware in the right humeral head and lower cervical spine, incompletely visualized. IMPRESSION: 1. Little change in asymmetric infiltrates or edema with upper lobe predominance, left greater than right. 2. Persistent small effusions. Electronically Signed   By: Lucrezia Europe M.D.   On: 06/05/2015 08:53   Ir Fluoro Guide Cv Midline Picc Left  05/23/2015  INDICATION: Bacteremia and previous right PICC line malfunctioning. Request for new left arm peripherally inserted central catheter for antibiotics. EXAM: ULTRASOUND AND FLUOROSCOPIC GUIDED PICC LINE INSERTION MEDICATIONS: None. CONTRAST:  None FLUOROSCOPY TIME:  18 seconds. COMPLICATIONS: None immediate TECHNIQUE: The procedure, risks, benefits, and alternatives were explained to the patient's wife and informed written consent was obtained. A timeout was performed prior to the initiation of the procedure. The left upper extremity was prepped with chlorhexidine in a sterile fashion, and a sterile drape was applied covering the operative field. Maximum barrier sterile technique with sterile gowns and gloves were used for the procedure. A timeout was performed prior to the initiation of the procedure. Local anesthesia was provided with 1% lidocaine. Under direct ultrasound guidance, the left brachial vein was accessed with a micropuncture kit after the overlying soft tissues were anesthetized with 1% lidocaine. An ultrasound image was saved for documentation purposes. A guidewire was advanced to the level of the superior caval-atrial junction for measurement purposes and the PICC line was cut to length. A peel-away sheath was placed and a 44 cm, 5 [place], single lumen  was inserted to level of the superior caval-atrial junction. A post procedure spot fluoroscopic was obtained. The catheter easily aspirated and flushed and was sutured in place. A dressing was placed. The patient tolerated the procedure well without immediate post procedural complication. FINDINGS: After catheter placement, the tip lies within the superior cavoatrial junction. The catheter aspirates and flushes normally and is ready for immediate use. IMPRESSION: Successful ultrasound and fluoroscopic guided placement of a left brachial vein approach, 44 cm, 5 [place], single lumen PICC with tip at the superior caval-atrial junction. The PICC line is ready for immediate use. Read By:  Tsosie Billing PA-C Electronically Signed   By: Aletta Edouard M.D.   On: 05/23/2015 10:35    Thurnell Lose, MD  [facility] Pager:[phone]  If 7PM-7AM, please contact night-coverage [email or web address] Password [email or web address] 06/08/2015, 9:25 AM   LOS: 4 days

## 2015-06-08 NOTE — Progress Notes (Signed)
VASCULAR LAB PRELIMINARY  PRELIMINARY  PRELIMINARY  PRELIMINARY  Right upper extremity venous duplex completed.    Preliminary report:  Right:  Previously noted DVT and superficial thrombosis in the subclavian. axillary, and basilic veins appear resolved.  The cephalic vein in the forearm appears chronically thrombosed.  Charles Bridges, RVT 06/08/2015, 9:07 AM

## 2015-06-08 NOTE — Progress Notes (Signed)
Name: Charles Bridges MRN: [id number] DOB: 05-21-1936    ADMISSION DATE:  06/04/2015 CONSULTATION DATE:  11/11  REFERRING MD : [facility] CHIEF COMPLAINT:  FTT     HISTORY OF PRESENT ILLNESS:   Unfortunate 79 yo wm smoker age [age], who suffered a fall in September 2016 resulting in central cord syndrome despite surgical decompression. He is non ambulatory and on anticoagulation for RUE thrombus from PICC line along with CVA history. Further he suffers from dementia and is almost deaf. He was residing in [facility] when on 11/10 he developed hemoptysis (on [place]). He has been treated with long term abx per ID staph bacteremia. Admitted 11/10 with new hemoptysis, desaturation(70's) and was admitted to floor. Currently on no abx and in no acute distress other than he can't hear. Urine culture was negative and BC x 2 are pending. He is on heparin drip without further hemoptysis. CxR remains unchanged with bilateral lung opacities.    Significant Studies Venous dopplers 06/08/15 Right: Previously noted DVT and superficial thrombosis in the subclavian. axillary, and basilic veins appear resolved. The cephalic vein in the forearm appears chronically thrombosed.  No Known Allergies  FAMILY HISTORY:  family history includes Cancer in his mother; Other in his father and mother. SOCIAL HISTORY:  reports that he quit smoking about 15 years ago. His smoking use included Cigarettes. He has never used smokeless tobacco. He reports that he does not drink alcohol or use illicit drugs.    SUBJECTIVE:  No further hemoptysis/ nad on NP 4lpm/ back on hep drip   VITAL SIGNS: Temp:  [98 F (36.7 C)-100 F (37.8 C)] 98 F (36.7 C) (11/12 2208) Pulse Rate:  [71-80] 80 (11/12 2208) Resp:  [18-20] 20 (11/12 2208) BP: (118-125)/(36-41) 118/36 mmHg (11/12 2208) SpO2:  [92 %-100 %] 100 % (11/12 2208) Weight:  [200 lb 6.4 oz (90.9 kg)] 200 lb 6.4 oz (90.9 kg) (11/13 0047)  PHYSICAL  EXAMINATION: General:  Frail  elderly, demented, deaf wm in no distress. Neuro:  HOH, left cva, central cord syndrome.    HEENT:  No Jvd, Oral mucosa wet Cardiovascular: HSDf Lungs:  Decreased bs bases, poor air movement Abdomen:  Obese + bs Musculoskeletal: Warm Lower ext boots Skin:  warm   Recent Labs Lab 06/05/15 0814 06/06/15 0531 06/08/15 0530  NA 141 147* 144  K 3.2* 3.5 3.4*  CL 105 109 108  CO2 BUN 28* 31* 20  CREATININE 1.08 1.15 1.03  GLUCOSE 93 101* 91    Recent Labs Lab 06/06/15 0531 06/07/15 0628 06/08/15 0530  HGB 7.6* 8.4* 8.3*  HCT 24.9* 27.5* 26.8*  WBC 4.7 6.1 10.2  PLT 291 281 282   Dg Chest Port 1 View  06/07/2015  CLINICAL DATA:  Shortness of breath, hemoptysis, dementia EXAM: PORTABLE CHEST 1 VIEW COMPARISON:  06/06/2015 FINDINGS: Multifocal patchy opacities in the bilateral upper lobes, left greater than right. Suspected small left pleural effusion. No pneumothorax. Cardiomegaly. Cervical spine fixation hardware. IMPRESSION: Multifocal patchy opacities in the bilateral upper lobes, suspicious for pneumonia, or less likely interstitial edema. Suspected small left pleural effusion. Electronically Signed   By: Charline Bills M.D.   On: 06/07/2015 09:04    ASSESSMENT: COPD (chronic obstructive pulmonary disease) (HCC)     Hypoxia (HCC)    Hemoptysis while on anticoagulation   History of CVA (cerebrovascular accident)   Cervical spinal cord injury without evidence of spinal bone injury (HCC) post fall  Central cord syndrome at C3 level of cervical spinal cord (HCC)   Left-sided weakness, post cva   Dementia   HOH   Anemia   Gram-positive cocci bacteremia   Coagulase negative Staphylococcus bacteremia   Diabetes mellitus, type 2 (HCC)   Acute respiratory failure (HCC)    FTT (failure to thrive) in adult    Upper ext Venous thrombosis on anticoagualtion    Recent Labs Lab 06/06/15 0531 06/07/15 0628 06/08/15 0530  HGB  7.6* 8.4* 8.3*    BNP  492  06/05/15    Discussion:   chest x-ray is consistent with alveolar hemorrhage and no definite active clot treat /Assoc with  persistent anemia albeit gradually improving  With no evidence of infection though is at risk for sure    I have no problem with prophylactic rx with either sub q hep or lovenox or even low dose eliquis but not need for therapeutic dosing and there is a risk of recurrent hemorrhage esp since we did not identify a cause - will continue to monitor     Sandrea HughsMichael Demetres Prochnow, MD Pulmonary and Critical Care Medicine [facility] Cell [phone] After 5:30 PM or weekends, call [phone]

## 2015-06-08 NOTE — Progress Notes (Signed)
Sutures noted to the back of patient neck. Wife stated they were from surgery on 04/15/2015. MD made aware.

## 2015-06-08 NOTE — Progress Notes (Signed)
ANTICOAGULATION CONSULT NOTE  Pharmacy Consult for Heparin  Indication: DVT  No Known Allergies  Patient Measurements: Height: 6' (182.9 cm) Weight: 200 lb 6.4 oz (90.9 kg) IBW/kg (Calculated) : 77.6  Vital Signs: Temp: 98 F (36.7 C) (11/12 2208) Temp Source: Oral (11/12 2208) BP: 118/36 mmHg (11/12 2208) Pulse Rate: 80 (11/12 2208)  Labs:  Recent Labs  06/05/15 2103  06/06/15 0531 06/07/15 0628 06/07/15 2231 06/08/15 0530  HGB  --   < > 7.6* 8.4*  --  8.3*  HCT  --   --  24.9* 27.5*  --  26.8*  PLT  --   --  291 281  --  282  APTT 106*  --  105*  --   --   --   HEPARINUNFRC 0.87*  --  0.68 0.47 0.30 0.29*  CREATININE  --   --  1.15  --   --  1.03  < > = values in this interval not displayed.  Estimated Creatinine Clearance: 65.9 mL/min (by C-G formula based on Cr of 1.03).   Assessment: 79 y.o. male with RUE DVT for heparin. HL today 0.29. Hgb 8.3 and stable, Plt 282. Will increase heparin gtt slightly to put pt back into therapeutic range. Of note, patient coughed up approx 30mL thick, sticky blood per RN yesterday. No other episodes of hemoptysis since admission. MD aware.  No further bleeding reported today.   Goal of Therapy:  Heparin level 0.3-0.5 units/ml (lower end of range requested by MD) Monitor platelets by anticoagulation protocol: Yes   Plan:  Increase heparin to 1150 units/hr 8hr HL - 1700 today Daily HL, CBC, s/sx bleeding F/U plans to resume PTA anticoag     Reuel Derbyobert J Tylisa Alcivar, PharmD. PGY1 resident Pager: [phone] 06/08/2015,8:27 AM

## 2015-06-09 ENCOUNTER — Inpatient Hospital Stay (HOSPITAL_COMMUNITY): Payer: Medicare Other

## 2015-06-09 DIAGNOSIS — R0489 Hemorrhage from other sites in respiratory passages: Secondary | ICD-10-CM

## 2015-06-09 LAB — BASIC METABOLIC PANEL
Anion gap: 9 (ref 5–15)
BUN: 16 mg/dL (ref 6–20)
CHLORIDE: 108 mmol/L (ref 101–111)
CO2: 27 mmol/L (ref 22–32)
Calcium: 8.5 mg/dL — ABNORMAL LOW (ref 8.9–10.3)
Creatinine, Ser: 1.01 mg/dL (ref 0.61–1.24)
GFR calc non Af Amer: >60 mL/min (ref >60)
Glucose, Bld: 122 mg/dL — ABNORMAL HIGH (ref 65–99)
POTASSIUM: 3.9 mmol/L (ref 3.5–5.1)
SODIUM: 144 mmol/L (ref 135–145)

## 2015-06-09 LAB — MAGNESIUM: MAGNESIUM: 1.9 mg/dL (ref 1.7–2.4)

## 2015-06-09 LAB — CULTURE, BLOOD (ROUTINE X 2)
CULTURE: NO GROWTH
CULTURE: NO GROWTH

## 2015-06-09 LAB — CBC
HEMATOCRIT: 27.1 % — AB (ref 39.0–52.0)
HEMOGLOBIN: 8.3 g/dL — AB (ref 13.0–17.0)
MCH: 30.2 pg (ref 26.0–34.0)
MCHC: 30.6 g/dL (ref 30.0–36.0)
MCV: 98.5 fL (ref 78.0–100.0)
Platelets: 293 10*3/uL (ref 150–400)
RBC: 2.75 MIL/uL — AB (ref 4.22–5.81)
RDW: 16.3 % — ABNORMAL HIGH (ref 11.5–15.5)
WBC: 8.6 10*3/uL (ref 4.0–10.5)

## 2015-06-09 MED ORDER — IPRATROPIUM-ALBUTEROL 0.5-2.5 (3) MG/3ML IN SOLN
3.0000 mL | Freq: Four times a day (QID) | RESPIRATORY_TRACT | Status: DC
  Administered 2015-06-09 – 2015-06-10 (×4): 3 mL via RESPIRATORY_TRACT
  Filled 2015-06-09 (×5): qty 3

## 2015-06-09 MED ORDER — FUROSEMIDE 10 MG/ML IJ SOLN
20.0000 mg | Freq: Once | INTRAMUSCULAR | Status: AC
  Administered 2015-06-09: 20 mg via INTRAVENOUS
  Filled 2015-06-09: qty 2

## 2015-06-09 NOTE — Care Management Important Message (Signed)
Important Message  Patient Details  Name: Charles Bridges MRN: [id number] Date of Birth: [birth year]   Medicare Important Message Given:  Yes    Husain Costabile P Khup Sapia 06/09/2015, 3:18 PM

## 2015-06-09 NOTE — Care Management Note (Signed)
Case Management Note  Patient Details  Name: Eulis CannerJohn E Bisceglia MRN: [id number] Date of Birth: 20-Dec-1935  Subjective/Objective:    Patient for possibly dc tomorrow to snf, CSW following, patient with lots of mucus today- desated, chest pt ordered.                Action/Plan:   Expected Discharge Date:                  Expected Discharge Plan:  Skilled Nursing Facility  In-House Referral:  Clinical Social Work  Discharge planning Services  CM Consult  Post Acute Care Choice:    Choice offered to:     DME Arranged:    DME Agency:     HH Arranged:    HH Agency:     Status of Service:  Completed, signed off  Medicare Important Message Given:  Yes Date Medicare IM Given:    Medicare IM give by:    Date Additional Medicare IM Given:    Additional Medicare Important Message give by:     If discussed at Long Length of Stay Meetings, dates discussed:    Additional Comments:  Leone Havenaylor, Kobey Sides [place], RN 06/09/2015, 3:24 PM

## 2015-06-09 NOTE — Progress Notes (Addendum)
CSW informed of plan for return to [facility] tomorrow (11/15)- CSW confirmed with [facility] that they can accept pt back tomorrow- insurance Berkley Harveyauth has been initiated for SNF stay  CSW will continue to follow  Merlyn LotJenna Holoman, [facility] & Health CenterCSWA Clinical Social Worker [phone]

## 2015-06-09 NOTE — Progress Notes (Signed)
Dr. Thedore MinsSingh made aware of patient's sutures in back of neck and verbal order to remove. Sutures removed by Ginger Gleason, RN. Skin clean, dry and scar intact.

## 2015-06-09 NOTE — Progress Notes (Signed)
Dr. Thedore MinsSingh made aware of manual blood pressure of 140/30. Telephone order okay to give ordered lasix.  Janell QuietKayla Price, RN

## 2015-06-09 NOTE — Progress Notes (Signed)
Patient with SOB this morning stating he was having trouble breathing, oxygen sat found to be 79% on 5L oxygen via [place], LS diminished with bilateral rhonchi and expiratory wheezes present, albuterol neb administered, respiratory called to room, patient encouraged to cough and breathe with moderate amount of sputum up, some blood noted, oxygen sat quickly back up to 100% after expectoration, Dr. Claiborne Billingsallahan notified and aware, BP 132/83, HR 82, patient now resting comfortably, will continue to monitor closely.

## 2015-06-09 NOTE — Progress Notes (Signed)
Speech Language Pathology Treatment: Dysphagia  Patient Details Name: Charles Bridges MRN: [id number] DOB: [birth year] Today's Date: 06/09/2015 Time: [id number] SLP Time Calculation (min) (ACUTE ONLY): 16 min  Assessment / Plan / Recommendation Clinical Impression  Pt was unable to recall swallow precautions with memory impairments. Decreased mandibular excursion and left buccal cavity residue with Dys 3 texture. Mod-max verbal cueing for strategies (lingual sweep, small sips). Wet vocal quality before and during trial with cracker and nectar. No change in RN's documented lung sounds.  Continue Dys 3, nectar liquids and ST.   HPI HPI: Charles CannerJohn E Leth is a 79 y.o. male with multiple medical problems chronically anticoagulated for history of atrial fibrillation/CVAs. Patient was on Coumadin, changeed to Xarelto approximately one week ago . He sustained a cervical fracture after a fall in late September, status post decompression in October. Readmitted early October with staff bacteremia requiring prolonged antibiotics. Patient developed thrombus in RUE where PICC was placed, PICC now in LUE. He follows with Dr. Jerolyn Centerynthia Snyder, infectious disease. Patient still getting IV antibiotics at [facility] he has been for rehabilitation following the fall. Patient brought into the hospital for hemoptysis, hypoxia and mental status changes on home 02, sats in 70's. Pt had an MBS on 10/7 recommending uprgrade from nectar thick liquids to thin liquids with an intermittent throat clear and second swallow to clear trace penetrate and mild residuals, but was returned to nectar thick liquids prior to d/c due to inability to follow strategies secondary to pain. Current CXR shows asymmetric infiltrates or edema with upper lobe predominance, left greater than right.      SLP Plan  Continue with current plan of care     Recommendations  Diet recommendations: Dysphagia 3 (mechanical soft);Nectar-thick liquid Liquids provided  via: Cup;No straw Medication Administration: Whole meds with puree Supervision: Staff to assist with self feeding;Full supervision/cueing for compensatory strategies Compensations: Slow rate;Small sips/bites;Lingual sweep for clearance of pocketing Postural Changes and/or Swallow Maneuvers: Seated upright 90 degrees              Oral Care Recommendations: Oral care BID Follow up Recommendations: Skilled Nursing facility Plan: Continue with current plan of care   Royce MacadamiaLitaker, Josedaniel Haye Willis 06/09/2015, 10:30 AM  Breck CoonsLisa Willis Lonell FaceLitaker M.Ed [employer]-SLP Pager [phone]

## 2015-06-09 NOTE — Progress Notes (Signed)
Name: Charles Bridges MRN: [id number] DOB: 11-04-1935    ADMISSION DATE:  06/04/2015 CONSULTATION DATE:  11/11  REFERRING MD : [facility] CHIEF COMPLAINT:  FTT    HISTORY OF PRESENT ILLNESS:   Unfortunate 79 yo wm smoker age [age], who suffered a fall in September 2016 resulting in central cord syndrome despite surgical decompression. He is non ambulatory and on anticoagulation for RUE thrombus from PICC line along with CVA history. Further he suffers from dementia and is almost deaf. He was residing in [facility] when on 11/10 he developed hemoptysis (on [place]). He has been treated with long term abx per ID staph bacteremia. Admitted 11/10 with new hemoptysis, desaturation(70's) and was admitted to floor. Currently on no abx and in no acute distress other than he can't hear. Urine culture was negative and BC x 2 are pending. He is on heparin drip without further hemoptysis. CxR remains unchanged with bilateral lung opacities.     SUBJECTIVE:  Feels somewhat better and denies further hemoptysis.  Remains confused.  CXR improved, Hgb stable.  VITAL SIGNS: Temp:  [98.3 F (36.8 C)-99.7 F (37.6 C)] 98.3 F (36.8 C) (11/13 2104) Pulse Rate:  [73-85] 73 (11/14 0912) Resp:  [18-20] 20 (11/14 0912) BP: (108-132)/(43-83) 132/83 mmHg (11/14 0541) SpO2:  [91 %-97 %] 94 % (11/14 0912) Weight:  [87 kg (191 lb 12.8 oz)] 87 kg (191 lb 12.8 oz) (11/14 0541)  PHYSICAL EXAMINATION: General:  Frail  elderly, demented, deaf wm in no distress. Neuro:  HOH. Follows commads with with upper ext R > L. HEENT:  No Jvd, Oral mucosa wet Cardiovascular: RRR. Lungs:  Decreased bs bases, poor air movement, clear. Abdomen:  Obese + bs Musculoskeletal: No edema. Skin:  warm   Recent Labs Lab 06/06/15 0531 06/08/15 0530 06/09/15 0610  NA 147* 144 144  K 3.5 3.4* 3.9  CL 109 108 108  CO2 BUN 31* 20 16  CREATININE 1.15 1.03 1.01  GLUCOSE 101* 91 122*    Recent Labs Lab  06/07/15 0628 06/08/15 0530 06/09/15 0610  HGB 8.4* 8.3* 8.3*  HCT 27.5* 26.8* 27.1*  WBC 6.1 10.2 8.6  PLT 281 282 293   Dg Chest Port 1 View  06/09/2015  CLINICAL DATA:  Shortness of breath. EXAM: PORTABLE CHEST 1 VIEW COMPARISON:  Radiographs dated 06/07/2015, 06/06/2015 and 06/05/2015 and CT scan dated 06/04/2015 FINDINGS: The infiltrate in the left upper lobe is unchanged. There has been improvement in the right upper lobe infiltrate. Heart size and pulmonary vascularity are normal. No appreciable effusions. IMPRESSION: Improved right upper lobe infiltrate. Persistent left upper lobe infiltrate. Electronically Signed   By: Francene Boyers M.D.   On: 06/09/2015 08:07    ASSESSMENT: COPD (chronic obstructive pulmonary disease) (HCC)     Hypoxia (HCC)    Hemoptysis while on anticoagulation   History of CVA (cerebrovascular accident)   Cervical spinal cord injury without evidence of spinal bone injury (HCC) post fall   Central cord syndrome at C3 level of cervical spinal cord (HCC)   Left-sided weakness, post cva   Dementia   HOH   Anemia   Gram-positive cocci bacteremia   Coagulase negative Staphylococcus bacteremia   Diabetes mellitus, type 2 (HCC)   Acute respiratory failure (HCC)    FTT (failure to thrive) in adult    Upper ext Venous thrombosis on anticoagualtion   He has hx of UE DVT which repeat US shows has resolved (though he  does have 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  Anticoagulation was stopped and since then, he is doing much better.  His CXR was concerning for alveolar hemorrhage; though it is certainly improving since off anticoagulation. Recs: Continue to hold further anticoagulation (except for prophylactic heparin). Continue to monitor for further signs of hemoptysis. Follow hgb - has remained stable which is re-assuring. Continue supplemental O2 to maintain SpO2 > 90%. Continue BD's. Pulmonary hygiene (especially given his swallow eval and hx CVA / bed  bound condition). CXR in AM. Consider palliative care consult for goals of care.  Rest per primary team.   Rutherford Guysahul Thaddeus Evitts, PA - [facility] [place] [facility] Pager: [phone]  or [phone] 06/09/2015, 12:22 PM

## 2015-06-09 NOTE — Progress Notes (Signed)
PATIENT DETAILS Name: Charles Bridges Age: 79 y.o. Sex: male Date of Birth: July 22, 1936 Admit Date: 06/04/2015 Admitting Physician Waldemar Dickens, MD CLE:[id number],VCBSW L, MD   Brief narrative  79 year old male with history of prior CVA, history of carotid artery occlusion status post CEA, EtOH use, recent C4-5 fracture with cord edema-and resultant central cord syndrome (September 2016), recent history of coagulase neg staph bacteremia on IV vancomycin, history of right upper extremity DVT (likely from PICC) on anticoagulation-presented to the hospital for evaluation of acute hypoxic respiratory failure and hemoptysis.  Subjective:  Patient in bed, no headache, no chest- abd pain, improved SOB, no hemoptysis since 06/05/2015.  Assessment/Plan:   Acute hypoxic respiratory failure with bilateral pulmonary infiltrates and hemoptysis: Recent echogram done one month ago shows EF of 60% with no evidence of LVH.   most likely pulmonary hemorrhage due to anticoagulation for recent provoked DVT, he was afebrile with a normal pro-calcitonin, no leukocytosis, speech following to minimize the chance of aspiration. Has been adequately diuresed and currently off diuretics. He is much below his baseline dry weight appears to be 217 pounds. Pulmonary critical care on board. Currently not on antibiotics. Continue supportive care includes flutter valve and pulmonary toiletry along with chest PT which has been added continue oxygen and nebulizer treatments as needed. Asked x-ray and definite clinical improvement with continued supportive care and stoppage of full anticoagulation.  Hemoptysis: Secondary to above-none since since his provoked right upper extremity DVT from the previous PICC line has resolved on repeat ultrasound case discussed with pulmonary physician Dr. Madlyn Frankel and for now all anticoagulation will be stopped except prophylactic dose heparin.  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 Plan as above.  Recent history of coagulase negative bacteremia: ID consulted-recommendations are to continue to monitor off antibiotics. PICC line has been removed, catheter tip culture and blood cultures negative to date. Afebrile and without leukocytosis.  Acute on chronic encephalopathy: Suspect some amount of alcohol related encephalopathy at mild delirium due to acute sickness and hospitalization. Continue with Depakote and Supportive care.  Anemia: Suspect secondary to acute illness with anemia from chronic disease -sent minimal hemoptysis, monitor no need for transfusion.  Hx of Cord compression with Central cord syndrome/quadriparesis (Sept 2016):Following a mechanical fall and resultant C4-5 fracture with cord edema C3 -Patient S/p C3,4,5,and C6 laminectomies, Posterior arthrodesis C3-6, morselized allograft by Dr. Cyndy Freeze  on 04/15/2015. His right upper extremity around 3-4/5, left upper extremity around 1-2/5, bilateral lower extremities around 3/5.  Chronic indwelling Foley catheter: Removed on admission-voiding trial failed 11/10-as patient developed acute urinary retention-foley was reinserted.  Essential hypertension: Continue to hold all antihypertensives-BP, Will monitor.  History of CVA/carotid artery disease-status post carotid endarterectomy: Not on aspirin-as an anticoagulation, has significant deficits as outlined above due to central cord syndrome following a mechanical fall   ? Prior history of atrial fibrillation:   On IV heparin-telemetry shows sinus rhythm-hold metoprolol for now.  Dyslipidemia: Continue statin  History of peptic ulcer disease: Continue with PPI, is s/p vagotomy.  History of alcohol abuse: Resident of SNF since his discharge September this year-doubt any ongoing alcohol use  Bilateral lower extremity/heel pressure ulcers: Seen by  wound care RN-recommendations are foam dressing to affected areas, continue Prevalon boots  Left lung base nodule: Given risk factors  for bronchogenic carcinoma, follow-up chest CT at 1 year is recommended-defer to outpatient setting  ? History of DM: Monitor CBGs off sliding scale  Lab Results  Component Value Date   HGBA1C 6.0* 06/06/2015    CBG (last 3)   Recent Labs  06/07/15 1147 06/07/15 1657 06/07/15 2204  GLUCAP 112* 107* 120*       Disposition: Remain inpatient-Back to SNF when able  Anti-infectives    None      DVT Prophylaxis: Heparin    Code Status: Partial Code-DNI  Family Communication None at baseline  Procedures:  CT chest, right upper ext venous duplex  CONSULTS:  Pulmonary critical care  Time spent 35 mins.  MEDICATIONS: Scheduled Meds: . antiseptic oral rinse  7 mL Mouth Rinse BID  . divalproex  125 mg Oral Daily   And  . divalproex  250 mg Oral Q2000  . feeding supplement (ENSURE ENLIVE)  237 mL Oral BID BM  . feeding supplement (PRO-STAT SUGAR FREE 64)  30 mL Oral BID  . fentaNYL  25 mcg Transdermal Q72H  . heparin subcutaneous  5,000 Units Subcutaneous 3 times per day  . ipratropium-albuterol  3 mL Nebulization TID  . latanoprost  1 drop Right Eye QHS  . multivitamin with minerals  1 tablet Oral Daily  . pantoprazole  40 mg Oral Daily  . polyethylene glycol  17 g Oral Daily  . pravastatin  40 mg Oral Daily  . senna  1 tablet Oral BID  . thiamine  100 mg Oral Daily   Continuous Infusions:   PRN Meds:.albuterol, [DISCONTINUED] ondansetron **OR** ondansetron (ZOFRAN) IV, oxyCODONE, RESOURCE THICKENUP CLEAR    PHYSICAL EXAM: Vital signs in last 24 hours: Filed Vitals:   06/08/15 1952 06/08/15 2104 06/09/15 0541 06/09/15 0912  BP:  108/57 132/83   Pulse:  82 85 73  Temp:  98.3 F (36.8 C)    TempSrc:  Oral    Resp:  _0 Height:      Weight:   87 kg (191 lb 12.8 oz)   SpO2: 92% 92% 97% 94%    Weight  change: -3.9 kg (-8 lb 9.6 oz) Filed Weights   06/07/15 0546 06/08/15 0047 06/09/15 0541  Weight: 90.719 kg (200 lb) 90.9 kg (200 lb 6.4 oz) 87 kg (191 lb 12.8 oz)   Body mass index is 26.01 kg/(m^2).   Gen Exam: Awake and alert with clear speech.  Neck: Supple, No JVD.   Chest: B/L Clear.   CVS: S1 S2 Regular, no murmurs.  Abdomen: soft, BS +, non tender, non distended.  Extremities: no edema, lower extremities warm to touch. Neurologic: Quadriparesis with severe bilateral lower extremity weakness and left upper extremity weakness, right upper extremity 4 over 5 strength Skin: No Rash.    Intake/Output from previous day:  Intake/Output Summary (Last 24 hours) at 06/09/15 0949 Last data filed at 06/09/15 0809  Gross per 24 hour  Intake    120 ml  Output      0 ml  Net    120 ml     LAB RESULTS: CBC  Recent Labs Lab 06/03/15 1511 06/04/15 0632 06/05/15 0814 06/06/15 0531 06/07/15 0628 06/08/15 0530 06/09/15 0610  WBC 6.6 7.8 5.6 4.7 6.1 10.2 8.6  HGB 8.4* 8.6* 7.9* 7.6* 8.4* 8.3* 8.3*  HCT 25.9* 28.5* 25.6* 24.9* 27.5* 26.8* 27.1*  PLT 327 356 288 291 281 282 293  MCV 93.8 96.6 97.7 97.3 98.9 97.5 98.5  MCH 30.4 29.2 30.2 29.7 30.2 30.2 30.2  MCHC 32.4 30.2 30.9 30.5 30.5 31.0 30.6  RDW 15.4 15.6* 15.8* 15.8* 16.0* 16.0* 16.3*  LYMPHSABS 0.5* 0.6*  --   --   --   --   --   MONOABS 0.5 0.5  --   --   --   --   --   EOSABS 0.4 0.3  --   --   --   --   --   BASOSABS 0.0 0.0  --   --   --   --   --     Chemistries   Recent Labs Lab 06/04/15 0632 06/05/15 0814 06/06/15 0531 06/08/15 0530 06/09/15 0610  NA 142 141 147* 144 144  K 3.4* 3.2* 3.5 3.4* 3.9  CL 106 105 109 108 108  CO2 _0 GLUCOSE 122* 93 101* 91 122*  BUN 25* 28* 31* 20 16  CREATININE 1.12 1.08 1.15 1.03 1.01  CALCIUM 8.7* 8.4* 8.4* 8.4* 8.5*  MG  --   --   --  1.9 1.9    CBG:  Recent Labs Lab 06/06/15 2142 06/07/15 0809 06/07/15 1147 06/07/15 1657 06/07/15 2204    GLUCAP 127* 118* 112* 107* 120*    GFR Estimated Creatinine Clearance: 67.2 mL/min (by C-G formula based on Cr of 1.01).  Coagulation profile  Recent Labs Lab 06/04/15 0632  INR 3.26*    Cardiac Enzymes  Recent Labs Lab 06/04/15 0632  TROPONINI <0.03    Invalid input(s): POCBNP No results for input(s): DDIMER in the last 72 hours. No results for input(s): HGBA1C in the last 72 hours. No results for input(s): CHOL, HDL, LDLCALC, TRIG, CHOLHDL, LDLDIRECT in the last 72 hours. No results for input(s): TSH, T4TOTAL, T3FREE, THYROIDAB in the last 72 hours.  Invalid input(s): FREET3 No results for input(s): VITAMINB12, FOLATE, FERRITIN, TIBC, IRON, RETICCTPCT in the last 72 hours. No results for input(s): LIPASE, AMYLASE in the last 72 hours.  Urine Studies No results for input(s): UHGB, CRYS in the last 72 hours.  Invalid input(s): UACOL, UAPR, USPG, UPH, UTP, UGL, UKET, UBIL, UNIT, UROB, ULEU, UEPI, UWBC, URBC, UBAC, CAST, UCOM, BILUA  MICROBIOLOGY: Recent Results (from the past 240 hour(s))  Blood culture (routine single)     Status: None (Preliminary result)   Collection Time: 06/03/15  2:55 PM  Result Value Ref Range Status   Preliminary Report Blood Culture received; No Growth to date;  Preliminary   Preliminary Report Culture will be held for 5 days before issuing  Preliminary   Preliminary Report a Final Negative report.  Preliminary  Blood culture (routine single)     Status: None (Preliminary result)   Collection Time: 06/03/15  3:09 PM  Result Value Ref Range Status   Preliminary Report Blood Culture received; No Growth to date;  Preliminary   Preliminary Report Culture will be held for 5 days before issuing  Preliminary    Comment: Culture results may be compromised due to an excessive volume of blood received in culture bottles.    Preliminary Report a Final Negative report.  Preliminary  Culture, blood (routine x 2)     Status: None (Preliminary  result)   Collection Time: 06/04/15  6:14 AM  Result Value Ref Range Status   Specimen Description BLOOD RIGHT HAND  Final   Special Requests BOTTLES DRAWN AEROBIC AND ANAEROBIC 10CC  Final   Culture NO GROWTH 4 DAYS  Final  Report Status PENDING  Incomplete  Culture, blood (routine x 2)     Status: None (Preliminary result)   Collection Time: 06/04/15  6:14 AM  Result Value Ref Range Status   Specimen Description BLOOD RIGHT ARM  Final   Special Requests BOTTLES DRAWN AEROBIC ONLY 10CC  Final   Culture NO GROWTH 4 DAYS  Final   Report Status PENDING  Incomplete  Urine culture     Status: None   Collection Time: 06/04/15  6:30 AM  Result Value Ref Range Status   Specimen Description URINE, RANDOM  Final   Special Requests NONE  Final   Culture NO GROWTH 2 DAYS  Final   Report Status 06/06/2015 FINAL  Final  Cath Tip Culture     Status: None   Collection Time: 06/04/15 11:15 AM  Result Value Ref Range Status   Specimen Description CATH TIP  Final   Special Requests L PICC TIP  Final   Culture NO GROWTH Performed at [facility]   Final   Report Status 06/07/2015 FINAL  Final  MRSA PCR Screening     Status: None   Collection Time: 06/04/15 10:18 PM  Result Value Ref Range Status   MRSA by PCR NEGATIVE NEGATIVE Final    Comment:        The GeneXpert MRSA Assay (FDA approved for NASAL specimens only), is one component of a comprehensive MRSA colonization surveillance program. It is not intended to diagnose MRSA infection nor to guide or monitor treatment for MRSA infections.     RADIOLOGY STUDIES/RESULTS: Ct Angio Chest Pe W/cm &/or Wo Cm  06/04/2015  CLINICAL DATA:  Hemoptysis. Hypertension. Hyperlipidemia. Prior CVA. Ex-smoker. EXAM: CT ANGIOGRAPHY CHEST WITH CONTRAST TECHNIQUE: Multidetector CT imaging of the chest was performed using the standard protocol during bolus administration of intravenous contrast. Multiplanar CT image reconstructions and MIPs were  obtained to evaluate the vascular anatomy. CONTRAST:  30m OMNIPAQUE IOHEXOL 350 MG/ML SOLN COMPARISON:  Outside CT dated 04/13/2015. Prior report not submitted. Chest radiograph of 04/27/2015. FINDINGS: Mediastinum/Nodes: Motion and patient position degraded exam. Arms not raised above the head. No pulmonary embolism to the lobar to large segmental level. A left-sided PICC line which terminates at the left brachiocephalic vein. Aortic and branch vessel atherosclerosis. Mild cardiomegaly. Multivessel coronary artery atherosclerosis. Right paratracheal adenopathy at 1.3 cm (image 36, series 4). This is new since 01/07/2010. Prevascular nodes measure up to the 1.3 cm. Lungs/Pleura: Small to moderate bilateral pleural effusions. Moderate centrilobular emphysema. A 3 mm left lower lobe pulmonary nodule (image 50 for, series 6). Absent in 2011. Apices minimally excluded. Dependent upper and less so lower lobe patchy airspace and interstitial opacities. Upper abdomen: Normal imaged portions of the liver, spleen, pancreas, adrenal glands. gastric diverticulum, (image 92, series 4). Musculoskeletal: Remote left rib trauma. Suspect ankylosing spondylitis throughout the thoracic spine. Review of the MIP images confirms the above findings. IMPRESSION: 1. Motion and position degraded exam. No pulmonary embolism to the large segmental level. 2. Bilateral pleural effusions with bilateral dependent patchy interstitial and airspace opacities. Considerations include pulmonary edema superimposed upon emphysema versus multifocal infection. 3. Adenopathy which could be reactive. Consider followup with chest CT at 3 months to confirm resolution. 4.  Atherosclerosis, including within the coronary arteries. 5. Gastric diverticulum. 6. Probable ankylosing spondylitis. 7. Left lung base nodule. Given risk factors for bronchogenic carcinoma, follow-up chest CT at 1 year is recommended. This recommendation follows the consensus statement:  "Guidelines for Management of Small  Pulmonary Nodules Detected on CT Scans: A Statement from the [employer]" as published in Radiology 2005; 237:395-400. Available online at: [email or web address].[email or web address].com/. 8. Left-sided PICC line terminating at the left brachiocephalic vein. Electronically Signed   By: Abigail Miyamoto M.D.   On: 06/04/2015 07:53   Ir US Guide Vasc Access Left  05/23/2015  INDICATION: Bacteremia and previous right PICC line malfunctioning. Request for new left arm peripherally inserted central catheter for antibiotics. EXAM: ULTRASOUND AND FLUOROSCOPIC GUIDED PICC LINE INSERTION MEDICATIONS: None. CONTRAST:  None FLUOROSCOPY TIME:  18 seconds. COMPLICATIONS: None immediate TECHNIQUE: The procedure, risks, benefits, and alternatives were explained to the patient's wife and informed written consent was obtained. A timeout was performed prior to the initiation of the procedure. The left upper extremity was prepped with chlorhexidine in a sterile fashion, and a sterile drape was applied covering the operative field. Maximum barrier sterile technique with sterile gowns and gloves were used for the procedure. A timeout was performed prior to the initiation of the procedure. Local anesthesia was provided with 1% lidocaine. Under direct ultrasound guidance, the left brachial vein was accessed with a micropuncture kit after the overlying soft tissues were anesthetized with 1% lidocaine. An ultrasound image was saved for documentation purposes. A guidewire was advanced to the level of the superior caval-atrial junction for measurement purposes and the PICC line was cut to length. A peel-away sheath was placed and a 44 cm, 5 [place], single lumen was inserted to level of the superior caval-atrial junction. A post procedure spot fluoroscopic was obtained. The catheter easily aspirated and flushed and was sutured in place. A dressing was placed. The patient tolerated the procedure  well without immediate post procedural complication. FINDINGS: After catheter placement, the tip lies within the superior cavoatrial junction. The catheter aspirates and flushes normally and is ready for immediate use. IMPRESSION: Successful ultrasound and fluoroscopic guided placement of a left brachial vein approach, 44 cm, 5 [place], single lumen PICC with tip at the superior caval-atrial junction. The PICC line is ready for immediate use. Read By:  Tsosie Billing PA-C Electronically Signed   By: Aletta Edouard M.D.   On: 05/23/2015 10:35   Dg Chest Port 1 View  06/09/2015  CLINICAL DATA:  Shortness of breath. EXAM: PORTABLE CHEST 1 VIEW COMPARISON:  Radiographs dated 06/07/2015, 06/06/2015 and 06/05/2015 and CT scan dated 06/04/2015 FINDINGS: The infiltrate in the left upper lobe is unchanged. There has been improvement in the right upper lobe infiltrate. Heart size and pulmonary vascularity are normal. No appreciable effusions. IMPRESSION: Improved right upper lobe infiltrate. Persistent left upper lobe infiltrate. Electronically Signed   By: Lorriane Shire M.D.   On: 06/09/2015 08:07   Dg Chest Port 1 View  06/07/2015  CLINICAL DATA:  Shortness of breath, hemoptysis, dementia EXAM: PORTABLE CHEST 1 VIEW COMPARISON:  06/06/2015 FINDINGS: Multifocal patchy opacities in the bilateral upper lobes, left greater than right. Suspected small left pleural effusion. No pneumothorax. Cardiomegaly. Cervical spine fixation hardware. IMPRESSION: Multifocal patchy opacities in the bilateral upper lobes, suspicious for pneumonia, or less likely interstitial edema. Suspected small left pleural effusion. Electronically Signed   By: Julian Hy M.D.   On: 06/07/2015 09:04   Dg Chest Port 1 View  06/06/2015  CLINICAL DATA:  Shortness of breath. EXAM: PORTABLE CHEST 1 VIEW COMPARISON:  06/05/2015 FINDINGS: Cardiac silhouette is upper limits of normal in size, unchanged. Coarse interstitial and airspace opacities  in both lungs with an upper lobe predominance have not significantly changed.  There are likely persistent small bilateral pleural effusions. No pneumothorax. IMPRESSION: Unchanged bilateral lung opacities which may reflect multifocal pneumonia or edema. Electronically Signed   By: Logan Bores M.D.   On: 06/06/2015 07:10   Dg Chest Port 1 View  06/05/2015  CLINICAL DATA:  Shortness of Breath EXAM: PORTABLE CHEST - 1 VIEW COMPARISON:  CT from the previous day, and earlier studies FINDINGS: coarse airspace opacities in the upper lobes left greater than right, which were present on the prior CT, not convincingly changed in distribution or severity. Heart size upper limits normal for technique. Small pleural effusions. No pneumothorax. Orthopedic hardware in the right humeral head and lower cervical spine, incompletely visualized. IMPRESSION: 1. Little change in asymmetric infiltrates or edema with upper lobe predominance, left greater than right. 2. Persistent small effusions. Electronically Signed   By: Lucrezia Europe M.D.   On: 06/05/2015 08:53   Ir Fluoro Guide Cv Midline Picc Left  05/23/2015  INDICATION: Bacteremia and previous right PICC line malfunctioning. Request for new left arm peripherally inserted central catheter for antibiotics. EXAM: ULTRASOUND AND FLUOROSCOPIC GUIDED PICC LINE INSERTION MEDICATIONS: None. CONTRAST:  None FLUOROSCOPY TIME:  18 seconds. COMPLICATIONS: None immediate TECHNIQUE: The procedure, risks, benefits, and alternatives were explained to the patient's wife and informed written consent was obtained. A timeout was performed prior to the initiation of the procedure. The left upper extremity was prepped with chlorhexidine in a sterile fashion, and a sterile drape was applied covering the operative field. Maximum barrier sterile technique with sterile gowns and gloves were used for the procedure. A timeout was performed prior to the initiation of the procedure. Local anesthesia was  provided with 1% lidocaine. Under direct ultrasound guidance, the left brachial vein was accessed with a micropuncture kit after the overlying soft tissues were anesthetized with 1% lidocaine. An ultrasound image was saved for documentation purposes. A guidewire was advanced to the level of the superior caval-atrial junction for measurement purposes and the PICC line was cut to length. A peel-away sheath was placed and a 44 cm, 5 [place], single lumen was inserted to level of the superior caval-atrial junction. A post procedure spot fluoroscopic was obtained. The catheter easily aspirated and flushed and was sutured in place. A dressing was placed. The patient tolerated the procedure well without immediate post procedural complication. FINDINGS: After catheter placement, the tip lies within the superior cavoatrial junction. The catheter aspirates and flushes normally and is ready for immediate use. IMPRESSION: Successful ultrasound and fluoroscopic guided placement of a left brachial vein approach, 44 cm, 5 [place], single lumen PICC with tip at the superior caval-atrial junction. The PICC line is ready for immediate use. Read By:  Tsosie Billing PA-C Electronically Signed   By: Aletta Edouard M.D.   On: 05/23/2015 10:35    Thurnell Lose, MD  [facility] Pager:[phone]  If 7PM-7AM, please contact night-coverage [email or web address] Password [email or web address] 06/09/2015, 9:49 AM   LOS: 5 days

## 2015-06-09 NOTE — Progress Notes (Addendum)
06/09/15 Patient has been having diarrhea throughout the shift. Dr. Thedore MinsSingh paged to make aware. Telephone order to hold senokot and miralax doses and continue to monitor.   06/10/15 0800 Dr. Thedore MinsSingh present at bedside. RN reported overnight in shift change that bowel movements were "slowing down" and only 2 episodes of diarrhea overnight. Dr. Thedore MinsSingh aware.

## 2015-06-10 ENCOUNTER — Inpatient Hospital Stay (HOSPITAL_COMMUNITY): Payer: Medicare Other

## 2015-06-10 LAB — CULTURE, BLOOD (SINGLE)
ORGANISM ID, BACTERIA: NO GROWTH
Organism ID, Bacteria: NO GROWTH

## 2015-06-10 MED ORDER — RESOURCE THICKENUP CLEAR PO POWD: ORAL | Status: AC

## 2015-06-10 MED ORDER — FENTANYL 25 MCG/HR TD PT72: 25.0000 ug | MEDICATED_PATCH | TRANSDERMAL | Status: AC

## 2015-06-10 MED ORDER — METOPROLOL TARTRATE 25 MG PO TABS: 25.0000 mg | ORAL_TABLET | Freq: Two times a day (BID) | ORAL | Status: AC

## 2015-06-10 MED ORDER — ENSURE ENLIVE PO LIQD: 237.0000 mL | Freq: Two times a day (BID) | ORAL | Status: AC

## 2015-06-10 MED ORDER — ALBUTEROL SULFATE (2.5 MG/3ML) 0.083% IN NEBU: 2.5000 mg | INHALATION_SOLUTION | Freq: Four times a day (QID) | RESPIRATORY_TRACT | Status: AC | PRN

## 2015-06-10 MED ORDER — TAMSULOSIN HCL 0.4 MG PO CAPS: 0.4000 mg | ORAL_CAPSULE | Freq: Every day | ORAL | Status: AC

## 2015-06-10 NOTE — Evaluation (Signed)
Physical Therapy Evaluation Patient Details Name: Charles Bridges MRN: [id number] DOB: 08-16-1935 Today's Date: 06/10/2015   History of Present Illness  Pt adm with acute hypoxic respiratory failure and hemoptysis. Pt with recent C4-5 fracture with resultant central cord syndrome (September 2016), Pt underwent surgical repair of fx's. PMH - CVA,ETOH, DVT  Clinical Impression  Pt admitted with above diagnosis and presents to PT with functional limitations due to deficits listed below (See PT problem list). Pt needs skilled PT at SNF to maximize independence and decr burden of care.     Follow Up Recommendations SNF    Equipment Recommendations  Other (comment) (Per SNF)    Recommendations for Other Services       Precautions / Restrictions Precautions Precautions: Fall Precaution Comments: Wife states that pt no longer has to wear cervical collar. Required Braces or Orthoses:  (Wife states that pt no longer has to wear cervical collar.) Restrictions Weight Bearing Restrictions: No      Mobility  Bed Mobility Overal bed mobility: Needs Assistance Bed Mobility: Supine to Sit;Sit to Supine;Rolling Rolling: +2 for physical assistance;Total assist   Supine to sit: +2 for physical assistance;Total assist Sit to supine: +2 for physical assistance;Total assist   General bed mobility comments: Pt able to assist by helping to move legs off bed. Assist to elevate trunk into sitting and assist to bring legs back up into bed and lower trunk back to supine.  Transfers                    Ambulation/Gait                Stairs            Wheelchair Mobility    Modified Rankin (Stroke Patients Only)       Balance Overall balance assessment: Needs assistance Sitting-balance support: No upper extremity supported;Feet supported Sitting balance-Leahy Scale: Poor Sitting balance - Comments: Pt sat EOB x 12 minutes with min guard to mod A. Postural control:  Posterior lean;Right lateral lean;Left lateral lean;Other (comment) (varies)                                   Pertinent Vitals/Pain      Home Living Family/patient expects to be discharged to:: Skilled nursing facility                      Prior Function Level of Independence: Needs assistance   Gait / Transfers Assistance Needed: 2 person assist/mechanical lift for bed mobility and transfers. Non ambulatory since SCI           Hand Dominance   Dominant Hand: Right    Extremity/Trunk Assessment   Upper Extremity Assessment: RUE deficits/detail RUE Deficits / Details: Shoulder 2+/5, elbow 3-/5, wrist 3-/5     LUE Deficits / Details: shoulder and elbow 2+/5. Hand and wrist <2/5   Lower Extremity Assessment: RLE deficits/detail;LLE deficits/detail RLE Deficits / Details: grossly 2+/5 LLE Deficits / Details: grossly 2+/5     Communication   Communication: HOH  Cognition Arousal/Alertness: Awake/alert Behavior During Therapy: Anxious Overall Cognitive Status: Impaired/Different from baseline Area of Impairment: Orientation;Memory;Attention;Following commands;Problem solving Orientation Level: Disoriented to;Place;Time;Situation Current Attention Level: Sustained Memory: Decreased recall of precautions;Decreased short-term memory Following Commands: Follows one step commands consistently     Problem Solving: Decreased initiation;Requires verbal cues;Requires tactile cues;Slow processing  General Comments      Exercises        Assessment/Plan    PT Assessment All further PT needs can be met in the next venue of care  PT Diagnosis Quadraplegia   PT Problem List Decreased strength;Decreased activity tolerance;Decreased balance;Decreased mobility;Decreased cognition;Decreased knowledge of use of DME  PT Treatment Interventions     PT Goals (Current goals can be found in the Care Plan section) Acute Rehab PT Goals PT Goal  Formulation: All assessment and education complete, DC therapy    Frequency     Barriers to discharge        Co-evaluation               End of Session Equipment Utilized During Treatment: Oxygen Activity Tolerance: Patient tolerated treatment well Patient left: in bed;with call bell/phone within reach;with bed alarm set;with family/visitor present Nurse Communication: Mobility status         Time: 1119-1140 PT Time Calculation (min) (ACUTE ONLY): 21 min   Charges:   PT Evaluation $Initial PT Evaluation Tier I: 1 Procedure     PT G Codes:        Daija Routson 06/11/15, 11:59 AM Quinlee Sciarra PT [phone]

## 2015-06-10 NOTE — Progress Notes (Signed)
Report called to Nikki at [facility] 

## 2015-06-10 NOTE — Progress Notes (Addendum)
Ok per MD [facility] [place]. Patient and wife Talbert ForestShirley notified of d/c and nursing notified to call report.  DC summary sent to facility for review and EMS arranged for 2:15 pm pick up. No further CSW needs identified for this patient. CSW signing off.  Lorri Frederickonna T. Andria RheinCrowder, LCSW [phone]   [facility]. Patient's request for SNF auth is currently under review and may go to a Peer to Peer review. Additional clinical information provided and [facility]. CSW received call from [place], Admissions at [facility] who indicated that per her Administrator- OK to send patient to facility and will [facility].  Lorri Frederickonna T. Jaci LazierCrowder, [place] [phone]

## 2015-06-10 NOTE — Discharge Summary (Signed)
Charles Bridges, is a 79 y.o. male  DOB 01/04/1936  MRN [id number].  Admission date:  06/04/2015  Admitting Physician  Waldemar Dickens, MD  Discharge Date:  06/10/2015   Primary MD  Leonides Sake, MD  Recommendations for primary care physician for things to follow:   Full feeding assistance and aspiration precautions, see diet instructions below.  Flutter valve every hour with good pulmonary toiletry 4 times a day with deep suctioning.  Close outpatient pulmonary follow-up  Check CBC, BMP 2 view chest x-ray in 3-4 days.   Admission Diagnosis  Hemoptysis [R04.2]   Discharge Diagnosis  Hemoptysis [R04.2]     Active Problems:   History of CVA (cerebrovascular accident)   Cervical spinal cord injury without evidence of spinal bone injury ([place])   Acute respiratory failure, unspecified whether with hypoxia or hypercapnia (HCC)   Acute encephalopathy   UTI (urinary tract infection)   Central cord syndrome at C3 level of cervical spinal cord (HCC)   Left-sided weakness   Anemia   Gram-positive cocci bacteremia   Coagulase negative Staphylococcus bacteremia   Hemoptysis   Diabetes mellitus, type 2 (HCC)   COPD (chronic obstructive pulmonary disease) (HCC)   Acute respiratory failure (HCC)    Hypoxia (HCC)   FTT (failure to thrive) in adult   Pulmonary alveolar hemorrhage      Past Medical History  Diagnosis Date  . Carotid artery occlusion   . Hypertension   . Hyperlipidemia   . Arthritis   . Stroke [facility]) 1998 and  1999  . Ulcer     Hiatal Hernia / Gastric ulcers  . History of vagotomy   . Chronic diarrhea   . Fall against object Oct. 16, 2015    Pt got tangled up with dog chain out side in the woods.  . Diabetes mellitus 2006    TYPE 2  . Hemoptysis 06/04/2015    Past Surgical History    Procedure Laterality Date  . Carotid endarterectomy  03/02/2000    Right CEA  . Fracture surgery      Right Shoulder  . Fracture right leg    . Radiology with anesthesia N/A 04/15/2015    Procedure: MRI CERVICAL SPINE WITHOUT CONTRAST    (RADIOLOGY WITH ANESTHESIA);  Surgeon: Medication Radiologist, MD;  Location: [place];  Service: Radiology;  Laterality: N/A;  . Posterior cervical fusion/foraminotomy N/A 04/15/2015    Procedure: POSTERIOR CERVICAL FUSION/FORAMINOTOMY LEVEL 3;  Surgeon: Ashok Pall, MD;  Location: [place];  Service: Neurosurgery;  Laterality: N/A;  C3-6 posterior cervical arthrodesis with instrumentation  . Radiology with anesthesia Left 04/30/2015    Procedure: MRI BRAIN WITHOUT CONTRAST, MRI CERVICAL SPINE WITH AND WITHOUT CONTRAST;  Surgeon: Medication Radiologist, MD;  Location: [place];  Service: Radiology;  Laterality: Left;  DR. SULLIVAN/MRI       HPI  from the history and physical done on the day of admission:    79 year old male with history of prior CVA, history of carotid artery occlusion status post  CEA, EtOH use, recent C4-5 fracture with cord edema-and resultant central cord syndrome (September 2016), recent history of coagulase neg staph bacteremia on IV vancomycin, history of right upper extremity DVT (likely from PICC) on anticoagulation-presented to the hospital for evaluation of acute hypoxic respiratory failure and hemoptysis.     Hospital Course:     Acute hypoxic respiratory failure with bilateral pulmonary infiltrates and hemoptysis: Recent echogram done one month ago shows EF of 60% with no evidence of LVH.   most likely pulmonary hemorrhage due to anticoagulation for recent provoked DVT, he was afebrile with a normal pro-calcitonin, no leukocytosis, speech following to minimize the chance of aspiration. Has been adequately diuresed and currently off diuretics. He is much below his baseline dry weight appears to be 217 pounds. Pulmonary critical care on  board. Currently not on antibiotics. Continue supportive care includes flutter valve and pulmonary toiletry along with chest PT which has been added continue oxygen and nebulizer treatments as needed. Asked x-ray and definite clinical improvement with continued supportive care and stoppage of full anticoagulation.  Hemoptysis: Secondary to above-none since since his provoked right upper extremity DVT from the previous PICC line has resolved on repeat ultrasound case discussed with pulmonary physician Dr. Christinia Gully and for now all anticoagulation will be stopped except prophylactic dose heparin.  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 Plan as above.  Hx of Cord compression with Central cord syndrome/quadriparesis (Sept 2016):Following a mechanical fall and resultant C4-5 fracture with cord edema C3 -Patient S/p C3,4,5,and C6 laminectomies, Posterior arthrodesis C3-6, morselized allograft by Dr. Cyndy Freeze on 04/15/2015. His right upper extremity around 3-4/5, left upper extremity around 1-2/5, bilateral lower extremities around 3/5. Follow with neurosurgeon in 1-2 weeks. He needs to require high degree of assistance for activities of daily living, discharged to SNF.  Recent history of coagulase negative bacteremia: ID consulted-recommendations are to continue to monitor off antibiotics. PICC line has been removed, catheter tip culture and blood cultures negative to date. Afebrile and without leukocytosis.  Acute on chronic encephalopathy: Suspect some amount of alcohol related encephalopathy at mild delirium due to acute sickness and hospitalization. Continue with Depakote and Supportive care.  Anemia: Suspect secondary to acute illness with anemia from chronic disease -sent minimal hemoptysis, monitor no need for transfusion.  Chronic indwelling Foley catheter: Removed on admission-voiding trial  failed 11/10-as patient developed acute urinary retention-foley was reinserted. Placed on Flomax with outpatient urology follow-up.  Essential hypertension: Continue to hold all antihypertensives-BP, Will monitor.  History of CVA/carotid artery disease-status post carotid endarterectomy: Not on aspirin-as an anticoagulation, has significant deficits as outlined above due to central cord syndrome following a mechanical fall   ? Prior history of atrial fibrillation: In sinus here, poor intact coordination candidate due to pulmonary hemorrhage above..  Dyslipidemia: Continue statin  History of peptic ulcer disease: Continue with PPI, is s/p vagotomy.  History of alcohol abuse: Resident of SNF since his discharge September this year-doubt any ongoing alcohol use  Bilateral lower extremity/heel pressure ulcers: Seen by wound care RN-recommendations are foam dressing to affected areas, continue Prevalon boots, continue wound care at SNF.  Left lung base nodule: Given risk factors for bronchogenic carcinoma, follow-up chest CT at 1 year is recommended-defer to outpatient setting  ? History of DM - A1c of 6       Discharge Condition: Guarded  Follow UP  Follow-up Information  Schedule an appointment as soon as possible for a visit with Leonides Sake, MD.   Specialty:  Family Medicine   Why:  and your Neurosurgeon in 1 week   Contact information:   [place] [address] [phone]       Follow up with [facility], MD. Schedule an appointment as soon as possible for a visit in 1 week.   Specialty:  Pulmonary Disease   Why:  Lung Nodule   Contact information:   [place] [address] [phone]       Follow up with [facility]. Schedule an appointment as soon as possible for a visit in 1 week.   Contact information:   [place] [address] [phone]        Consults obtained -  PCCM  Diet and Activity recommendation: See Discharge Instructions below  Discharge Instructions           Discharge Instructions    Discharge instructions    Complete by:  As directed   You have a Left lung base nodule: Given risk factors for lung cancer,please ask you primary MD to repeat a follow-up chest CT at 1 year.  Follow with Primary MD HAMRICK,MAURA L, MD in 7 days   Get CBC, CMP, 2 view Chest X ray checked  by Primary MD next visit.    Activity: As tolerated with Full fall precautions use walker/cane & assistance as needed   Disposition SNF   Diet: Dysphagia 3 diet nectar thick liquids with full feeding assistance and aspiration precautions.   For Heart failure patients - Check your Weight same time everyday, if you gain over 2 pounds, or you develop in leg swelling, experience more shortness of breath or chest pain, call your Primary MD immediately. Follow Cardiac Low Salt Diet and 1.5 lit/day fluid restriction.   On your next visit with your primary care physician please Get Medicines reviewed and adjusted.   Please request your Prim.MD to go over all Hospital Tests and Procedure/Radiological results at the follow up, please get all Hospital records sent to your Prim MD by signing hospital release before you go home.   If you experience worsening of your admission symptoms, develop shortness of breath, life threatening emergency, suicidal or homicidal thoughts you must seek medical attention immediately by calling 911 or calling your MD immediately  if symptoms less severe.  You Must read complete instructions/literature along with all the possible adverse reactions/side effects for all the Medicines you take and that have been prescribed to you. Take any new Medicines after you have completely understood and accpet all the possible adverse reactions/side effects.   Do not drive, operating heavy machinery, perform activities at heights, swimming or participation  in water activities or provide baby sitting services if your were admitted for syncope or siezures until you have seen by Primary MD or a Neurologist and advised to do so again.  Do not drive when taking Pain medications.    Do not take more than prescribed Pain, Sleep and Anxiety Medications  Special Instructions: If you have smoked or chewed Tobacco  in the last 2 yrs please stop smoking, stop any regular Alcohol  and or any Recreational drug use.  Wear Seat belts while driving.   Please note  You were cared for by a hospitalist during your hospital stay. If you have any questions about your discharge medications or the care you received  while you were in the hospital after you are discharged, you can call the unit and asked to speak with the hospitalist on call if the hospitalist that took care of you is not available. Once you are discharged, your primary care physician will handle any further medical issues. Please note that NO REFILLS for any discharge medications will be authorized once you are discharged, as it is imperative that you return to your primary care physician (or establish a relationship with a primary care physician if you do not have one) for your aftercare needs so that they can reassess your need for medications and monitor your lab values.     Increase activity slowly    Complete by:  As directed              Discharge Medications       Medication List    STOP taking these medications        cefTRIAXone 1 G injection  Commonly known as:  ROCEPHIN     HYDROcodone-acetaminophen 5-325 MG tablet  Commonly known as:  NORCO/VICODIN     lisinopril-hydrochlorothiazide 20-12.5 MG tablet  Commonly known as:  PRINZIDE,ZESTORETIC     XARELTO STARTER PACK 15 & 20 MG Tbpk  Generic drug:  Rivaroxaban      TAKE these medications        acetaminophen 325 MG tablet  Commonly known as:  TYLENOL  Take 650 mg by mouth every 4 (four) hours as needed for mild pain.      albuterol (2.5 MG/3ML) 0.083% nebulizer solution  Commonly known as:  PROVENTIL  Take 3 mLs (2.5 mg total) by nebulization every 6 (six) hours as needed for wheezing or shortness of breath.     aspirin 81 MG chewable tablet  Chew 1 tablet (81 mg total) by mouth daily.     divalproex 125 MG capsule  Commonly known as:  DEPAKOTE SPRINKLE  Take 125-250 mg by mouth 2 (two) times daily. Take 1 capsule every morning and take 2 capsules every evening     feeding supplement (ENSURE ENLIVE) Liqd  Take 237 mLs by mouth 2 (two) times daily between meals.     feeding supplement (PRO-STAT SUGAR FREE 64) Liqd  Take 30 mLs by mouth 2 (two) times daily.     fentaNYL 25 MCG/HR patch  Commonly known as:  DURAGESIC - dosed mcg/hr  Place 1 patch (25 mcg total) onto the skin every 3 (three) days.     folic acid 1 MG tablet  Commonly known as:  FOLVITE  Take 1 tablet (1 mg total) by mouth daily.     insulin aspart 100 UNIT/ML injection  Commonly known as:  novoLOG  Inject 3 Units into the skin See admin instructions. Every morning if CBG above 250     latanoprost 0.005 % ophthalmic solution  Commonly known as:  XALATAN  Place 1 drop into the right eye at bedtime.     metoprolol tartrate 25 MG tablet  Commonly known as:  LOPRESSOR  Take 1 tablet (25 mg total) by mouth 2 (two) times daily.     multivitamin with minerals Tabs tablet  Take 1 tablet by mouth daily.     pantoprazole 40 MG tablet  Commonly known as:  PROTONIX  Take 1 tablet (40 mg total) by mouth daily.     polyethylene glycol packet  Commonly known as:  MIRALAX / GLYCOLAX  Take 17 g by mouth daily.     pravastatin 40  MG tablet  Commonly known as:  PRAVACHOL  Take 40 mg by mouth daily.     RESOURCE THICKENUP CLEAR Powd  Use to thicken all liquids to nectar thick     senna 8.6 MG Tabs tablet  Commonly known as:  SENOKOT  Take 1 tablet (8.6 mg total) by mouth 2 (two) times daily.     tamsulosin 0.4 MG Caps capsule   Commonly known as:  FLOMAX  Take 1 capsule (0.4 mg total) by mouth daily after supper.     thiamine 100 MG tablet  Take 1 tablet (100 mg total) by mouth daily.        Major procedures and Radiology Reports - PLEASE review detailed and final reports for all details, in brief -       Ct Angio Chest Pe W/cm &/or Wo Cm  06/04/2015  CLINICAL DATA:  Hemoptysis. Hypertension. Hyperlipidemia. Prior CVA. Ex-smoker. EXAM: CT ANGIOGRAPHY CHEST WITH CONTRAST TECHNIQUE: Multidetector CT imaging of the chest was performed using the standard protocol during bolus administration of intravenous contrast. Multiplanar CT image reconstructions and MIPs were obtained to evaluate the vascular anatomy. CONTRAST:  78mL OMNIPAQUE IOHEXOL 350 MG/ML SOLN COMPARISON:  Outside CT dated 04/13/2015. Prior report not submitted. Chest radiograph of 04/27/2015. FINDINGS: Mediastinum/Nodes: Motion and patient position degraded exam. Arms not raised above the head. No pulmonary embolism to the lobar to large segmental level. A left-sided PICC line which terminates at the left brachiocephalic vein. Aortic and branch vessel atherosclerosis. Mild cardiomegaly. Multivessel coronary artery atherosclerosis. Right paratracheal adenopathy at 1.3 cm (image 36, series 4). This is new since 01/07/2010. Prevascular nodes measure up to the 1.3 cm. Lungs/Pleura: Small to moderate bilateral pleural effusions. Moderate centrilobular emphysema. A 3 mm left lower lobe pulmonary nodule (image 50 for, series 6). Absent in 2011. Apices minimally excluded. Dependent upper and less so lower lobe patchy airspace and interstitial opacities. Upper abdomen: Normal imaged portions of the liver, spleen, pancreas, adrenal glands. gastric diverticulum, (image 92, series 4). Musculoskeletal: Remote left rib trauma. Suspect ankylosing spondylitis throughout the thoracic spine. Review of the MIP images confirms the above findings. IMPRESSION: 1. Motion and position  degraded exam. No pulmonary embolism to the large segmental level. 2. Bilateral pleural effusions with bilateral dependent patchy interstitial and airspace opacities. Considerations include pulmonary edema superimposed upon emphysema versus multifocal infection. 3. Adenopathy which could be reactive. Consider followup with chest CT at 3 months to confirm resolution. 4.  Atherosclerosis, including within the coronary arteries. 5. Gastric diverticulum. 6. Probable ankylosing spondylitis. 7. Left lung base nodule. Given risk factors for bronchogenic carcinoma, follow-up chest CT at 1 year is recommended. This recommendation follows the consensus statement: "Guidelines for Management of Small Pulmonary Nodules Detected on CT Scans: A Statement from the [employer]" as published in Radiology 2005; 237:395-400. Available online at: https://www.arnold.com/. 8. Left-sided PICC line terminating at the left brachiocephalic vein. Electronically Signed   By: Abigail Miyamoto M.D.   On: 06/04/2015 07:53   Ir US Guide Vasc Access Left  05/23/2015  INDICATION: Bacteremia and previous right PICC line malfunctioning. Request for new left arm peripherally inserted central catheter for antibiotics. EXAM: ULTRASOUND AND FLUOROSCOPIC GUIDED PICC LINE INSERTION MEDICATIONS: None. CONTRAST:  None FLUOROSCOPY TIME:  18 seconds. COMPLICATIONS: None immediate TECHNIQUE: The procedure, risks, benefits, and alternatives were explained to the patient's wife and informed written consent was obtained. A timeout was performed prior to the initiation of the procedure. The left upper extremity was prepped with  chlorhexidine in a sterile fashion, and a sterile drape was applied covering the operative field. Maximum barrier sterile technique with sterile gowns and gloves were used for the procedure. A timeout was performed prior to the initiation of the procedure. Local anesthesia was provided with 1% lidocaine.  Under direct ultrasound guidance, the left brachial vein was accessed with a micropuncture kit after the overlying soft tissues were anesthetized with 1% lidocaine. An ultrasound image was saved for documentation purposes. A guidewire was advanced to the level of the superior caval-atrial junction for measurement purposes and the PICC line was cut to length. A peel-away sheath was placed and a 44 cm, 5 [place], single lumen was inserted to level of the superior caval-atrial junction. A post procedure spot fluoroscopic was obtained. The catheter easily aspirated and flushed and was sutured in place. A dressing was placed. The patient tolerated the procedure well without immediate post procedural complication. FINDINGS: After catheter placement, the tip lies within the superior cavoatrial junction. The catheter aspirates and flushes normally and is ready for immediate use. IMPRESSION: Successful ultrasound and fluoroscopic guided placement of a left brachial vein approach, 44 cm, 5 [place], single lumen PICC with tip at the superior caval-atrial junction. The PICC line is ready for immediate use. Read By:  Tsosie Billing PA-C Electronically Signed   By: Aletta Edouard M.D.   On: 05/23/2015 10:35   Dg Chest Port 1 View  06/10/2015  CLINICAL DATA:  Shortness of breath, acute respiratory failure central cord syndrome when C3 level EXAM: PORTABLE CHEST 1 VIEW COMPARISON:  Portable chest x-ray of June 09, 2015 FINDINGS: The lungs are well-expanded. The interstitial markings have become more conspicuous in both lungs. Relative sparing of the left mid and lower lung persists. There is no pleural effusion or pneumothorax. The cardiac silhouette is top-normal in size. The pulmonary vascularity is not engorged. The bony thorax exhibits no acute abnormality. IMPRESSION: Slight interval deterioration in the appearance of the pulmonary interstitium bilaterally consistent with pneumonia or interstitial edema. There is no  pleural effusion or pneumothorax. Electronically Signed   By: David  Martinique M.D.   On: 06/10/2015 08:04   Dg Chest Port 1 View  06/09/2015  CLINICAL DATA:  Shortness of breath. EXAM: PORTABLE CHEST 1 VIEW COMPARISON:  Radiographs dated 06/07/2015, 06/06/2015 and 06/05/2015 and CT scan dated 06/04/2015 FINDINGS: The infiltrate in the left upper lobe is unchanged. There has been improvement in the right upper lobe infiltrate. Heart size and pulmonary vascularity are normal. No appreciable effusions. IMPRESSION: Improved right upper lobe infiltrate. Persistent left upper lobe infiltrate. Electronically Signed   By: Lorriane Shire M.D.   On: 06/09/2015 08:07   Dg Chest Port 1 View  06/07/2015  CLINICAL DATA:  Shortness of breath, hemoptysis, dementia EXAM: PORTABLE CHEST 1 VIEW COMPARISON:  06/06/2015 FINDINGS: Multifocal patchy opacities in the bilateral upper lobes, left greater than right. Suspected small left pleural effusion. No pneumothorax. Cardiomegaly. Cervical spine fixation hardware. IMPRESSION: Multifocal patchy opacities in the bilateral upper lobes, suspicious for pneumonia, or less likely interstitial edema. Suspected small left pleural effusion. Electronically Signed   By: Julian Hy M.D.   On: 06/07/2015 09:04   Dg Chest Port 1 View  06/06/2015  CLINICAL DATA:  Shortness of breath. EXAM: PORTABLE CHEST 1 VIEW COMPARISON:  06/05/2015 FINDINGS: Cardiac silhouette is upper limits of normal in size, unchanged. Coarse interstitial and airspace opacities in both lungs with an upper lobe predominance have not significantly changed. There are likely persistent  small bilateral pleural effusions. No pneumothorax. IMPRESSION: Unchanged bilateral lung opacities which may reflect multifocal pneumonia or edema. Electronically Signed   By: Sebastian Ache M.D.   On: 06/06/2015 07:10   Dg Chest Port 1 View  06/05/2015  CLINICAL DATA:  Shortness of Breath EXAM: PORTABLE CHEST - 1 VIEW COMPARISON:  CT  from the previous day, and earlier studies FINDINGS: coarse airspace opacities in the upper lobes left greater than right, which were present on the prior CT, not convincingly changed in distribution or severity. Heart size upper limits normal for technique. Small pleural effusions. No pneumothorax. Orthopedic hardware in the right humeral head and lower cervical spine, incompletely visualized. IMPRESSION: 1. Little change in asymmetric infiltrates or edema with upper lobe predominance, left greater than right. 2. Persistent small effusions. Electronically Signed   By: Corlis Leak M.D.   On: 06/05/2015 08:53   Ir Fluoro Guide Cv Midline Picc Left  05/23/2015  INDICATION: Bacteremia and previous right PICC line malfunctioning. Request for new left arm peripherally inserted central catheter for antibiotics. EXAM: ULTRASOUND AND FLUOROSCOPIC GUIDED PICC LINE INSERTION MEDICATIONS: None. CONTRAST:  None FLUOROSCOPY TIME:  18 seconds. COMPLICATIONS: None immediate TECHNIQUE: The procedure, risks, benefits, and alternatives were explained to the patient's wife and informed written consent was obtained. A timeout was performed prior to the initiation of the procedure. The left upper extremity was prepped with chlorhexidine in a sterile fashion, and a sterile drape was applied covering the operative field. Maximum barrier sterile technique with sterile gowns and gloves were used for the procedure. A timeout was performed prior to the initiation of the procedure. Local anesthesia was provided with 1% lidocaine. Under direct ultrasound guidance, the left brachial vein was accessed with a micropuncture kit after the overlying soft tissues were anesthetized with 1% lidocaine. An ultrasound image was saved for documentation purposes. A guidewire was advanced to the level of the superior caval-atrial junction for measurement purposes and the PICC line was cut to length. A peel-away sheath was placed and a 44 cm, 5 [place],  single lumen was inserted to level of the superior caval-atrial junction. A post procedure spot fluoroscopic was obtained. The catheter easily aspirated and flushed and was sutured in place. A dressing was placed. The patient tolerated the procedure well without immediate post procedural complication. FINDINGS: After catheter placement, the tip lies within the superior cavoatrial junction. The catheter aspirates and flushes normally and is ready for immediate use. IMPRESSION: Successful ultrasound and fluoroscopic guided placement of a left brachial vein approach, 44 cm, 5 [place], single lumen PICC with tip at the superior caval-atrial junction. The PICC line is ready for immediate use. Read By:  Pattricia Boss PA-C Electronically Signed   By: Irish Lack M.D.   On: 05/23/2015 10:35    Micro Results      Recent Results (from the past 240 hour(s))  Blood culture (routine single)     Status: None   Collection Time: 06/03/15  2:55 PM  Result Value Ref Range Status   Organism ID, Bacteria NO GROWTH 5 DAYS  Final  Blood culture (routine single)     Status: None   Collection Time: 06/03/15  3:09 PM  Result Value Ref Range Status   Organism ID, Bacteria NO GROWTH 5 DAYS  Final    Comment: Culture results may be compromised due to an excessive volume of blood received in culture bottles.   Culture, blood (routine x 2)     Status: None  Collection Time: 06/04/15  6:14 AM  Result Value Ref Range Status   Specimen Description BLOOD RIGHT HAND  Final   Special Requests BOTTLES DRAWN AEROBIC AND ANAEROBIC 10CC  Final   Culture NO GROWTH 5 DAYS  Final   Report Status 06/09/2015 FINAL  Final  Culture, blood (routine x 2)     Status: None   Collection Time: 06/04/15  6:14 AM  Result Value Ref Range Status   Specimen Description BLOOD RIGHT ARM  Final   Special Requests BOTTLES DRAWN AEROBIC ONLY 10CC  Final   Culture NO GROWTH 5 DAYS  Final   Report Status 06/09/2015 FINAL  Final  Urine culture      Status: None   Collection Time: 06/04/15  6:30 AM  Result Value Ref Range Status   Specimen Description URINE, RANDOM  Final   Special Requests NONE  Final   Culture NO GROWTH 2 DAYS  Final   Report Status 06/06/2015 FINAL  Final  Cath Tip Culture     Status: None   Collection Time: 06/04/15 11:15 AM  Result Value Ref Range Status   Specimen Description CATH TIP  Final   Special Requests L PICC TIP  Final   Culture NO GROWTH Performed at [facility]   Final   Report Status 06/07/2015 FINAL  Final  MRSA PCR Screening     Status: None   Collection Time: 06/04/15 10:18 PM  Result Value Ref Range Status   MRSA by PCR NEGATIVE NEGATIVE Final    Comment:        The GeneXpert MRSA Assay (FDA approved for NASAL specimens only), is one component of a comprehensive MRSA colonization surveillance program. It is not intended to diagnose MRSA infection nor to guide or monitor treatment for MRSA infections.     Today   Subjective    Brunetta Jeans today has no headache,no chest abdominal pain,no new weakness tingling or numbness, feels much better.   Objective   Blood pressure 124/41, pulse 74, temperature 98.4 F (36.9 C), temperature source Oral, resp. rate 20, height 6' (1.829 m), weight 89 kg (196 lb 3.4 oz), SpO2 95 %.   Intake/Output Summary (Last 24 hours) at 06/10/15 0934 Last data filed at 06/10/15 0916  Gross per 24 hour  Intake    415 ml  Output    825 ml  Net   -410 ml    Exam Awake Alert, Oriented x 3, No new F.N deficits, dense L sided weakness, R arm 3-4/5, R leg 1/5, Normal affect [place].AT,PERRAL Supple Neck,No JVD, No cervical lymphadenopathy appriciated.  Symmetrical Chest wall movement, Good air movement bilaterally, CTAB RRR,No Gallops,Rubs or new Murmurs, No Parasternal Heave +ve B.Sounds, Abd Soft, Non tender, No organomegaly appriciated, No rebound -guarding or rigidity. No Cyanosis, Clubbing or edema, No new Rash or bruise   Data Review    CBC w Diff:  Lab Results  Component Value Date   WBC 8.6 06/09/2015   HGB 8.3* 06/09/2015   HCT 27.1* 06/09/2015   PLT 293 06/09/2015   LYMPHOPCT 8 06/04/2015   MONOPCT 7 06/04/2015   EOSPCT 4 06/04/2015   BASOPCT 0 06/04/2015    CMP:  Lab Results  Component Value Date   NA 144 06/09/2015   K 3.9 06/09/2015   CL 108 06/09/2015   CO2 27 06/09/2015   BUN 16 06/09/2015   CREATININE 1.01 06/09/2015   CREATININE 0.97 06/03/2015   PROT 5.9* 06/04/2015   ALBUMIN 2.6*  06/04/2015   BILITOT 0.4 06/04/2015   ALKPHOS 62 06/04/2015   AST 29 06/04/2015   ALT 17 06/04/2015  .   Total Time in preparing paper work, data evaluation and todays exam - 35 minutes  Thurnell Lose M.D on 06/10/2015 at 9:34 AM  [facility]   Office  [phone]

## 2015-06-10 NOTE — Progress Notes (Signed)
Patient discharge to SNF. IV d/c. Wife at bedside and aware. Moisture break down to bilateral buttock with barrier cream applied. Stage 1 pressure injury to left heel and stage 2 pressure injury to right heel. Skin tear/sore dry and pink to penis. [facility] made aware.

## 2015-06-25 ENCOUNTER — Ambulatory Visit (INDEPENDENT_AMBULATORY_CARE_PROVIDER_SITE_OTHER): Payer: Medicare Other | Admitting: Pulmonary Disease

## 2015-06-25 ENCOUNTER — Encounter: Payer: Self-pay | Admitting: Pulmonary Disease

## 2015-06-25 VITALS — BP 122/82 | HR 72

## 2015-06-25 DIAGNOSIS — R599 Enlarged lymph nodes, unspecified: Secondary | ICD-10-CM

## 2015-06-25 DIAGNOSIS — R590 Localized enlarged lymph nodes: Secondary | ICD-10-CM | POA: Insufficient documentation

## 2015-06-25 NOTE — Progress Notes (Signed)
Subjective:    Patient ID: Charles Bridges, male    DOB: 09-27-1935, 79 y.o.   MRN: [id number]  HPI  79 year old male with  C4-5 fracture with quadriplegia (September 2016),  His course was completed by coagulase neg staph bacteremia on IV vancomycin,  right upper extremity DVT (likely from PICC) on anticoagulation- he was readm 05/2015 for hemoptysis  PMH -  prior CVA, history of carotid artery occlusion status post CEA, EtOH use,  He presents today for FU of nodule noted on CT , BIBEMS on stretcher. He has not been able to participate in rehab at [facility]. He denies dyspnea or productive cough. His main complaint is that his bottom hurts due to lying on stretcher. He does appear to be confused-and that appears to be his baseline as per his wife Charles Bridges   Significant tests/ events  CT angio - Bilateral pleural effusions with bilateral dependent patchy interstitial and airspace opacities. Considerations include pulmonary edema superimposed upon emphysema versus multifocal infection. RT paratracheal  Adenopathy 1.3 cm which could be reactive. LLL nodule  3mm   Past Medical History  Diagnosis Date  . Carotid artery occlusion   . Hypertension   . Hyperlipidemia   . Arthritis   . Stroke [facility]) 1998 and  1999  . Ulcer     Hiatal Hernia / Gastric ulcers  . History of vagotomy   . Chronic diarrhea   . Fall against object Oct. 16, 2015    Pt got tangled up with dog chain out side in the woods.  . Diabetes mellitus 2006    TYPE 2  . Hemoptysis 06/04/2015   Past Surgical History  Procedure Laterality Date  . Carotid endarterectomy  03/02/2000    Right CEA  . Fracture surgery      Right Shoulder  . Fracture right leg    . Radiology with anesthesia N/A 04/15/2015    Procedure: MRI CERVICAL SPINE WITHOUT CONTRAST    (RADIOLOGY WITH ANESTHESIA);  Surgeon: Medication Radiologist, MD;  Location: [facility] OR;  Service: Radiology;  Laterality: N/A;  . Posterior cervical fusion/foraminotomy N/A  04/15/2015    Procedure: POSTERIOR CERVICAL FUSION/FORAMINOTOMY LEVEL 3;  Surgeon: Coletta Memos, MD;  Location: [facility] OR;  Service: Neurosurgery;  Laterality: N/A;  C3-6 posterior cervical arthrodesis with instrumentation  . Radiology with anesthesia Left 04/30/2015    Procedure: MRI BRAIN WITHOUT CONTRAST, MRI CERVICAL SPINE WITH AND WITHOUT CONTRAST;  Surgeon: Medication Radiologist, MD;  Location: [facility] OR;  Service: Radiology;  Laterality: Left;  DR. Derwood Kaplan     No Known Allergies  Social History   Social History  . Marital Status: Married    Spouse Name: N/A  . Number of Children: N/A  . Years of Education: N/A   Occupational History  . Not on file.   Social History Main Topics  . Smoking status: Former Smoker    Types: Cigarettes    Quit date: 07/27/1999  . Smokeless tobacco: Never Used  . Alcohol Use: No     Comment: drinks 2 to 3 beers per night    " not since 9/18 2016 "  . Drug Use: No  . Sexual Activity: Not on file   Other Topics Concern  . Not on file   Social History Narrative     Family History  Problem Relation Age of Onset  . Cancer Mother     breast cancer  . Other Mother     Circulation problems and lower extremity amputation  .  Other Father     cerebrovascular accident      Review of Systems  Constitutional: Negative for fever, chills, activity change, appetite change and unexpected weight change.  HENT: Negative for congestion, dental problem, postnasal drip, rhinorrhea, sneezing, sore throat, trouble swallowing and voice change.   Eyes: Negative for visual disturbance.  Respiratory: Negative for cough, choking and shortness of breath.   Cardiovascular: Negative for chest pain and leg swelling.  Gastrointestinal: Negative for nausea, vomiting and abdominal pain.  Genitourinary: Negative for difficulty urinating.  Musculoskeletal: Negative for arthralgias.  Skin: Negative for rash.  Psychiatric/Behavioral: Negative for behavioral problems  and confusion.       Objective:   Physical Exam  Gen. On stretcher, well-nourished, in no distress, normal affect ENT - no lesions, no post nasal drip Neck: No JVD, no thyromegaly, no carotid bruits Lungs: no use of accessory muscles, no dullness to percussion, clear without rales or rhonchi  Cardiovascular: Rhythm regular, heart sounds  normal, no murmurs or gallops, no peripheral edema Abdomen: soft and non-tender, no hepatosplenomegaly, BS normal. Musculoskeletal: No deformities, no cyanosis or clubbing Neuro:  Alert, confused, BLE & LUE paralysed, power 4+/5 RUE      Assessment & Plan:

## 2015-06-25 NOTE — Assessment & Plan Note (Addendum)
The right paratracheal lymphadenopathy is probably reactive. The pulmonary nodule is tiny and doubt of any clinical significance. Hemoptysis seems to have been related to anticoagulation and has now stopped, not recurred. Since he does have a significant history of smoking, would recommend CT with contrast in 3 months. I doubt that he is a candidate for invasive diagnostics anyways. There were also gives and a porch area to see if he improves with rehabilitation

## 2015-06-25 NOTE — Patient Instructions (Signed)
CT chest with contrast in 3 months

## 2015-07-04 ENCOUNTER — Other Ambulatory Visit: Payer: Self-pay | Admitting: Neurosurgery

## 2015-07-04 DIAGNOSIS — R4182 Altered mental status, unspecified: Secondary | ICD-10-CM

## 2015-07-26 NOTE — Progress Notes (Signed)
Rfv: hospital follow up for CoNS complicated bacteremia Subjective:    Patient ID: Charles Bridges, male    DOB: 04-23-1936, 79 y.o.   MRN: [id number]  HPI 79 year old male with history of prior CVA, history of carotid artery occlusion status post CEA, EtOH use, recent C4-5 fracture with cord edema-and resultant central cord syndrome (September 2016). he was also hospitalized for coagulase neg staph bacteremia on IV vancomycin, which he was discharged to SNF for 4 wk to end on 05/28/2015. He denies fever, chills. Current Outpatient Prescriptions on File Prior to Visit  Medication Sig Dispense Refill  . aspirin 81 MG chewable tablet Chew 1 tablet (81 mg total) by mouth daily.    . folic acid (FOLVITE) 1 MG tablet Take 1 tablet (1 mg total) by mouth daily.    Marland Kitchen latanoprost (XALATAN) 0.005 % ophthalmic solution Place 1 drop into the right eye at bedtime.  3  . Multiple Vitamin (MULTIVITAMIN WITH MINERALS) TABS tablet Take 1 tablet by mouth daily.    . pantoprazole (PROTONIX) 40 MG tablet Take 1 tablet (40 mg total) by mouth daily.    Marland Kitchen senna (SENOKOT) 8.6 MG TABS tablet Take 1 tablet (8.6 mg total) by mouth 2 (two) times daily.  0  . thiamine 100 MG tablet Take 1 tablet (100 mg total) by mouth daily.     No current facility-administered medications on file prior to visit.   Active Ambulatory Problems    Diagnosis Date Noted  . Occlusion and stenosis of carotid artery without mention of cerebral infarction 03/07/2012  . Carotid stenosis 05/22/2014  . Aftercare following surgery of the circulatory system 05/22/2014  . Essential hypertension 04/13/2015  . HLD (hyperlipidemia) 04/13/2015  . Sensory loss 04/13/2015  . Rib fracture 04/13/2015  . History of CVA (cerebrovascular accident) 04/13/2015  . Falls 04/13/2015  . Alcohol abuse 04/13/2015  . Hyponatremia 04/13/2015  . TIA (transient ischemic attack) 04/13/2015  . Cervical spinal cord injury without evidence of spinal bone injury (HCC)  04/15/2015  . Aspiration pneumonia (HCC)   . Altered mental status 04/27/2015  . Chronic indwelling Foley catheter 04/28/2015  . UTI (urinary tract infection) 04/28/2015  . Central cord syndrome at C3 level of cervical spinal cord (HCC) 04/28/2015  . Left-sided weakness 04/28/2015  . Anemia 04/28/2015  . Bacteremia 04/29/2015  . Gram-positive cocci bacteremia 04/30/2015  . Pressure ulcer 05/01/2015  . Coagulase negative Staphylococcus bacteremia 05/01/2015  . Urinary tract infectious disease   . Hemoptysis 06/04/2015  . Diabetes mellitus, type 2 (HCC) 06/04/2015  . COPD (chronic obstructive pulmonary disease) (HCC) 06/04/2015  .  Hypoxia (HCC) 06/06/2015  . FTT (failure to thrive) in adult 06/06/2015  . Mediastinal lymphadenopathy 06/25/2015   Resolved Ambulatory Problems    Diagnosis Date Noted  . Acute respiratory failure, unspecified whether with hypoxia or hypercapnia (HCC)   . Acute encephalopathy 04/27/2015  . AKI (acute kidney injury) (HCC) 04/28/2015  . Acute respiratory failure (HCC) 06/04/2015  . Pulmonary alveolar hemorrhage 06/09/2015   Past Medical History  Diagnosis Date  . Carotid artery occlusion   . Hypertension   . Hyperlipidemia   . Arthritis   . Stroke [facility]) 1998 and  1999  . Ulcer   . History of vagotomy   . Chronic diarrhea   . Fall against object Oct. 16, 2015  . Diabetes mellitus 2006   Social History  Substance Use Topics  . Smoking status: Former Smoker    Types: Cigarettes    Quit  date: 07/27/1999  . Smokeless tobacco: Never Used  . Alcohol Use: No     Comment: drinks 2 to 3 beers per night    " not since 9/18 2016 "  family history includes Cancer in his mother; Other in his father and mother.  Review of Systems 10 point ros is negative, no fever, chills, nightsweats, no cough, n/v/diarrhea    Objective:   Physical Exam BP 153/49 mmHg  Pulse 70  Temp(Src) 99.1 F (37.3 C) (Oral)  gen = a xo by 3, appears frustrated heent =  NCAT, no signs of thrush pulm = ctab no w/c/r Neuro = mild grip strength upper extremities only  Reviewed labs cr 1.01     Assessment & Plan:  Complicated CoNS bacteremia = appears he has finished his course of vancomycin for complicated CoNS bacteremia. Records reveal no recent fevers. Will plan to monitor off of antibiotics  Central cord syndrome = still has very little use of upper extremities, will need to have careful observation for pressure ulcers

## 2015-07-29 ENCOUNTER — Ambulatory Visit
Admission: RE | Admit: 2015-07-29 | Discharge: 2015-07-29 | Disposition: A | Discharge status: Home / Self Care | Payer: Medicare Other | Source: Ambulatory Visit | Attending: Neurosurgery | Admitting: Neurosurgery

## 2015-07-29 DIAGNOSIS — R4182 Altered mental status, unspecified: Secondary | ICD-10-CM

## 2015-07-31 ENCOUNTER — Encounter: Payer: Self-pay | Admitting: Neurology

## 2015-07-31 ENCOUNTER — Ambulatory Visit (INDEPENDENT_AMBULATORY_CARE_PROVIDER_SITE_OTHER): Payer: Medicare Other | Admitting: Neurology

## 2015-07-31 VITALS — BP 129/57 | HR 66 | Resp 20

## 2015-07-31 DIAGNOSIS — R5381 Other malaise: Secondary | ICD-10-CM | POA: Diagnosis not present

## 2015-07-31 DIAGNOSIS — S129XXS Fracture of neck, unspecified, sequela: Secondary | ICD-10-CM | POA: Diagnosis not present

## 2015-07-31 DIAGNOSIS — G8250 Quadriplegia, unspecified: Secondary | ICD-10-CM | POA: Diagnosis not present

## 2015-07-31 DIAGNOSIS — R419 Unspecified symptoms and signs involving cognitive functions and awareness: Secondary | ICD-10-CM

## 2015-07-31 NOTE — Progress Notes (Signed)
Subjective:    Patient ID: Charles Bridges is a 80 y.o. male.  HPI     [name], MD, PhD [facility] [address], [address] [address] [place], [place] [address]  Dear Dr. Eloise HarmanPaterson,   I saw your patient, Charles GarbeJohn Meany, upon your kind request in my neurologic clinic today for initial consultation of his foot drop and gait disorder. The patient is accompanied by his son, Charles Bridges, his youngest son and only living biological child today. As you know, Charles Bridges is a 80 year old right-handed gentleman with a complicated medical history of carotid artery occlusion, status post right carotid endarterectomy in 2001, right shoulder surgery status post fracture, hypertension, hyperlipidemia, previous stroke, arthritis, hiatal hernia, gastric ulcers, type 2 diabetes, recent fall with injury, including C4-5 fracture with cord edema at C3, status post C3-4-5 and C6 laminectomies and posterior arthrodesis at C3-C6, under Dr. 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 He recently saw Dr. Mikal Planeabell in follow-up on 07/29/2015. Records are not available for my review but he did have a CT head without contrast on 07/29/2015: IMPRESSION: Atrophy and chronic ischemia.  No acute intracranial abnormality.   He is [facility] in [facility]. His son reports that there was mild memory loss prior to the fall in September. Until September however, the patient was independent and was living with his wife. This is his second wife  and they had 7 children (he had 3, she had 4, only Charles Bridges and Rodney's oldest step brother are alive).  He has had therapy regularly. He has had progress in the past couple months especially since November and in December. His weakness improved gradually but he is unable to walk. He has bilateral foot drop. He has ongoing issues with occasional confusion and memory loss but cognitively overall he has slowly improved as well.  His Past Medical History Is Significant For: Past Medical History  Diagnosis Date  . Carotid artery occlusion   . Hypertension   . Hyperlipidemia   . Arthritis   . Stroke [facility]) 1998 and  1999  . Ulcer     Hiatal Hernia / Gastric ulcers  . History of vagotomy   . Chronic diarrhea   . Fall against object Oct. 16, 2015    Pt got tangled up with dog chain out side in the woods.  . Diabetes mellitus 2006    TYPE 2  . Hemoptysis 06/04/2015    His Past Surgical History Is Significant For: Past Surgical History  Procedure Laterality Date  . Carotid endarterectomy  03/02/2000    Right CEA  . Fracture surgery      Right Shoulder  . Fracture right leg    . Radiology with anesthesia N/A 04/15/2015    Procedure: MRI CERVICAL SPINE WITHOUT CONTRAST    (RADIOLOGY WITH ANESTHESIA);  Surgeon: Medication Radiologist, MD;  Location: [facility] OR;  Service: Radiology;  Laterality: N/A;  . Posterior cervical fusion/foraminotomy N/A 04/15/2015    Procedure: POSTERIOR CERVICAL FUSION/FORAMINOTOMY LEVEL 3;  Surgeon: Coletta MemosKyle Cabbell, MD;  Location: [facility] OR;  Service: Neurosurgery;  Laterality: N/A;  C3-6 posterior cervical arthrodesis with instrumentation  .  Radiology with anesthesia Left 04/30/2015    Procedure: MRI BRAIN WITHOUT CONTRAST, MRI CERVICAL SPINE WITH AND WITHOUT CONTRAST;  Surgeon: Medication Radiologist, MD;  Location: [facility] OR;  Service: Radiology;  Laterality: Left;  DR. Derwood Kaplan    His Family History Is Significant For: Family History  Problem Relation Age of Onset  . Cancer  Mother     breast cancer  . Other Mother     Circulation problems and lower extremity amputation  . Other Father     cerebrovascular accident    His Social History Is Significant For: Social History   Social History  . Marital Status: Married    Spouse Name: N/A  . Number of Children: 7  . Years of Education: N/A   Social History Main Topics  . Smoking status: Former Smoker    Types: Cigarettes    Quit date: 07/27/1999  . Smokeless tobacco: Never Used  . Alcohol Use: No     Comment: drinks 2 to 3 beers per night    " not since 9/18 2016 "  . Drug Use: No  . Sexual Activity: Not Asked   Other Topics Concern  . None   Social History Narrative    His Allergies Are:  No Known Allergies:   His Current Medications Are:  Outpatient Encounter Prescriptions as of 07/31/2015  Medication Sig  . acetaminophen (TYLENOL) 325 MG tablet Take 650 mg by mouth every 4 (four) hours as needed for mild pain.  Marland Kitchen albuterol (PROVENTIL) (2.5 MG/3ML) 0.083% nebulizer solution Take 3 mLs (2.5 mg total) by nebulization every 6 (six) hours as needed for wheezing or shortness of breath.  . Amino Acids-Protein Hydrolys (FEEDING SUPPLEMENT, PRO-STAT SUGAR FREE 64,) LIQD Take 30 mLs by mouth 2 (two) times daily.  Marland Kitchen aspirin 81 MG chewable tablet Chew 1 tablet (81 mg total) by mouth daily.  . divalproex (DEPAKOTE SPRINKLE) 125 MG capsule Take 125-250 mg by mouth 2 (two) times daily. Take 1 capsule every morning and take 2 capsules every evening  . feeding supplement, ENSURE ENLIVE, (ENSURE ENLIVE) LIQD Take 237 mLs by mouth 2 (two) times daily between meals.  . fentaNYL (DURAGESIC - DOSED MCG/HR) 25 MCG/HR patch Place 1 patch (25 mcg total) onto the skin every 3 (three) days.  . folic acid (FOLVITE) 1 MG tablet Take 1 tablet (1 mg total) by mouth daily.  . insulin aspart (NOVOLOG) 100 UNIT/ML injection Inject 3 Units into the skin See admin instructions. Every morning if CBG above 250  . latanoprost  (XALATAN) 0.005 % ophthalmic solution Place 1 drop into the right eye at bedtime.  . Maltodextrin-Xanthan Gum (RESOURCE THICKENUP CLEAR) POWD Use to thicken all liquids to nectar thick  . metoprolol tartrate (LOPRESSOR) 25 MG tablet Take 1 tablet (25 mg total) by mouth 2 (two) times daily.  . Multiple Vitamin (MULTIVITAMIN WITH MINERALS) TABS tablet Take 1 tablet by mouth daily.  . pantoprazole (PROTONIX) 40 MG tablet Take 1 tablet (40 mg total) by mouth daily.  . polyethylene glycol (MIRALAX / GLYCOLAX) packet Take 17 g by mouth daily.  . pravastatin (PRAVACHOL) 40 MG tablet Take 40 mg by mouth daily.  Marland Kitchen senna (SENOKOT) 8.6 MG TABS tablet Take 1 tablet (8.6 mg total) by mouth 2 (two) times daily.  . tamsulosin (FLOMAX) 0.4 MG CAPS capsule Take 1 capsule (0.4 mg total) by mouth daily after supper.  . thiamine 100 MG tablet Take 1 tablet (100 mg total) by mouth daily.   No  facility-administered encounter medications on file as of 07/31/2015.  :   Review of Systems:  Out of a complete 14 point review of systems, all are reviewed and negative with the exception of these symptoms as listed below:   Review of Systems  Neurological:       Patient fell in September 2016, hit back of head and received stitches. Son reports that patient has seemed more confused since.     Objective:  Neurologic Exam  Physical Exam Physical Examination:   Filed Vitals:   07/31/15 1446  BP: 129/57  Pulse: 66  Resp: 20    General Examination: The patient is a very pleasant 80 y.o. male in no acute distress. He appears deconditioned. He is globally weak. He has muscle wasting and mild fasciculations are noted in his hands. He is in a Emergency planning/management officer.  HEENT: Normocephalic, atraumatic, pupils are equal, round and reactive to light and accommodation. Funduscopic exam is not possible. Tracking is fairly good. Speech is not dysarthric. He has dentures in place. Mouth is mildly dry.   Chest: Clear to  auscultation without wheezing, rhonchi or crackles noted.  Heart: S1+S2+0, regular and normal without murmurs, rubs or gallops noted.   Abdomen: Soft, non-tender and non-distended with normal bowel sounds appreciated on auscultation.  Extremities: There is bilateral foot drop.  Skin: Warm and dry without trophic changes noted.   Musculoskeletal: exam reveals: Left shoulder pain and decrease in range of motion passively on the left side in the upper extremity.  Neurologically:  Mental status: The patient is awake, alert and oriented to self and circumstance. His immediate and remote memory, attention, language skills and fund of knowledge are impaired. His history is provided by his son.  Cranial nerves II - XII are as described above under HEENT exam.  Motor exam: Thin bulk, occasional fasciculations in both hands, reflexes 1-2+, left toe upgoing, right toe equivocal. Strength is globally 3-4 out of 5, he is weaker on the left side. He has bilateral foot drop. Sensory exam is limited but he does have better sensation on the left side than the right. He is impaired in all modalities throughout.  He is unable to stand or walk for me.  Assessment and Plan:   Assessment and Plan:  In summary, LONZIE SIMMER is a very pleasant 80 y.o.-year old male with a complicated medical history of carotid artery occlusion, status post right carotid endarterectomy in 2001, right shoulder surgery status post fracture, hypertension, hyperlipidemia, previous stroke, arthritis, hiatal hernia, gastric ulcers, type 2 diabetes, recent fall with injury, including C4-5 fracture with cord edema at C3, status post C3-4-5 and C6 laminectomies and posterior arthrodesis at C3-C6, under Dr. 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 Overall, according to his son he has made some progress. On examination he has ongoing evidence of quadriparesis, he has cognitive issues, this in light of a history of multiple vascular risk factors, prior stroke, alcohol abuse, recent severe illness, anesthesia, deconditioning, infection and advancing age. At this juncture, I suggested ongoing supportive care and current management. Unfortunately, there is not much I can add at this time. He had a recent  head CT and follow-up with Dr. Mikal Plane. I will see him back as needed.  Thank you very much for allowing me to participate in the care of this nice patient. If I can be of any further assistance to you please do not hesitate to call me at [phone].  Sincerely,   Huston Foley, MD, PhD

## 2015-07-31 NOTE — Patient Instructions (Addendum)
Unfortunately, there is not much else I can add at this time. I would recommend: continue current management and supportive care. Follow up with Dr. Franky Machoabbell as planned.

## 2015-09-10 ENCOUNTER — Ambulatory Visit (HOSPITAL_COMMUNITY)
Admission: RE | Admit: 2015-09-10 | Discharge: 2015-09-10 | Disposition: A | Discharge status: Home / Self Care | Payer: Medicare Other | Source: Ambulatory Visit | Attending: Pulmonary Disease | Admitting: Pulmonary Disease

## 2015-09-10 ENCOUNTER — Encounter (HOSPITAL_COMMUNITY): Payer: Self-pay

## 2015-09-10 ENCOUNTER — Telehealth: Payer: Self-pay | Admitting: *Deleted

## 2015-09-10 DIAGNOSIS — R0602 Shortness of breath: Secondary | ICD-10-CM | POA: Diagnosis not present

## 2015-09-10 DIAGNOSIS — J90 Pleural effusion, not elsewhere classified: Secondary | ICD-10-CM | POA: Insufficient documentation

## 2015-09-10 DIAGNOSIS — J432 Centrilobular emphysema: Secondary | ICD-10-CM | POA: Insufficient documentation

## 2015-09-10 DIAGNOSIS — R590 Localized enlarged lymph nodes: Secondary | ICD-10-CM

## 2015-09-10 DIAGNOSIS — K314 Gastric diverticulum: Secondary | ICD-10-CM | POA: Diagnosis not present

## 2015-09-10 DIAGNOSIS — I2510 Atherosclerotic heart disease of native coronary artery without angina pectoris: Secondary | ICD-10-CM | POA: Insufficient documentation

## 2015-09-10 DIAGNOSIS — R918 Other nonspecific abnormal finding of lung field: Secondary | ICD-10-CM | POA: Insufficient documentation

## 2015-09-10 DIAGNOSIS — K769 Liver disease, unspecified: Secondary | ICD-10-CM | POA: Diagnosis not present

## 2015-09-10 DIAGNOSIS — R591 Generalized enlarged lymph nodes: Secondary | ICD-10-CM

## 2015-09-10 MED ORDER — IOHEXOL 300 MG/ML  SOLN
80.0000 mL | Freq: Once | INTRAMUSCULAR | Status: AC | PRN
  Administered 2015-09-10: 80 mL via INTRAVENOUS

## 2015-09-10 NOTE — Telephone Encounter (Signed)
CT ordered per CT results. Nothing further needed.

## 2015-09-10 NOTE — Telephone Encounter (Signed)
-----   Message from Oretha Milch, MD sent at 09/10/2015  1:19 PM EST ----- Stable lymphadenopathy-follow-up scan in 6 months

## 2015-11-25 ENCOUNTER — Telehealth: Payer: Self-pay | Admitting: Neurology

## 2015-11-25 NOTE — Telephone Encounter (Signed)
Pt's sister Harlon FlorLidia Stanfield called inquiring about MRI and several issues with her brother. Operator explained she is not on DPR and cannot discuss any information with her. She will have her sister-in-law come to [facility] and sign DPR. FYI

## 2015-12-02 NOTE — Telephone Encounter (Addendum)
Pt's sister Riki AltesLidia called in requesting MRI results and lab results. In general would like to speak with nurse of physician about care plan for the pt. Please call Isabelle CourseLydia [phone], she is on DPR now.

## 2015-12-03 NOTE — Telephone Encounter (Signed)
I spoke to Charles Bridges and gave her report from Dr. Teofilo PodAthar's office note. Charles Bridges is reaching out to see how she can help her brother and make sure that everything that can be done is being done. I advised that Dr. Frances FurbishAthar recommends that patient follow up with Dr. Mikal Planeabell. Charles Bridges voiced understanding and stated that she will call Dr. Jackelyn Knifeabell's office to get more information and copy of his records.

## 2015-12-26 ENCOUNTER — Emergency Department (HOSPITAL_COMMUNITY)
Admission: EM | Admit: 2015-12-26 | Discharge: 2015-12-26 | Disposition: A | Discharge status: Home / Self Care | Payer: Medicare Other | Attending: Emergency Medicine | Admitting: Emergency Medicine

## 2015-12-26 ENCOUNTER — Encounter (HOSPITAL_COMMUNITY): Payer: Self-pay

## 2015-12-26 ENCOUNTER — Emergency Department (HOSPITAL_COMMUNITY): Payer: Medicare Other

## 2015-12-26 DIAGNOSIS — Z859 Personal history of malignant neoplasm, unspecified: Secondary | ICD-10-CM | POA: Insufficient documentation

## 2015-12-26 DIAGNOSIS — E785 Hyperlipidemia, unspecified: Secondary | ICD-10-CM | POA: Diagnosis not present

## 2015-12-26 DIAGNOSIS — N390 Urinary tract infection, site not specified: Secondary | ICD-10-CM | POA: Insufficient documentation

## 2015-12-26 DIAGNOSIS — Z794 Long term (current) use of insulin: Secondary | ICD-10-CM | POA: Insufficient documentation

## 2015-12-26 DIAGNOSIS — E119 Type 2 diabetes mellitus without complications: Secondary | ICD-10-CM | POA: Insufficient documentation

## 2015-12-26 DIAGNOSIS — F0390 Unspecified dementia without behavioral disturbance: Secondary | ICD-10-CM | POA: Diagnosis not present

## 2015-12-26 DIAGNOSIS — R4182 Altered mental status, unspecified: Secondary | ICD-10-CM | POA: Diagnosis not present

## 2015-12-26 DIAGNOSIS — Z79899 Other long term (current) drug therapy: Secondary | ICD-10-CM | POA: Insufficient documentation

## 2015-12-26 DIAGNOSIS — Z87891 Personal history of nicotine dependence: Secondary | ICD-10-CM | POA: Insufficient documentation

## 2015-12-26 DIAGNOSIS — I10 Essential (primary) hypertension: Secondary | ICD-10-CM | POA: Insufficient documentation

## 2015-12-26 DIAGNOSIS — R569 Unspecified convulsions: Secondary | ICD-10-CM | POA: Insufficient documentation

## 2015-12-26 DIAGNOSIS — Z8673 Personal history of transient ischemic attack (TIA), and cerebral infarction without residual deficits: Secondary | ICD-10-CM | POA: Insufficient documentation

## 2015-12-26 LAB — URINALYSIS, ROUTINE W REFLEX MICROSCOPIC
BILIRUBIN URINE: NEGATIVE
Glucose, UA: NEGATIVE mg/dL
KETONES UR: NEGATIVE mg/dL
NITRITE: POSITIVE — AB
Protein, ur: 100 mg/dL — AB
SPECIFIC GRAVITY, URINE: 1.019 (ref 1.005–1.030)
pH: 6 (ref 5.0–8.0)

## 2015-12-26 LAB — CBC WITH DIFFERENTIAL/PLATELET
BASOS ABS: 0 10*3/uL (ref 0.0–0.1)
Basophils Relative: 0 %
EOS ABS: 0.1 10*3/uL (ref 0.0–0.7)
EOS PCT: 1 %
HCT: 37.6 % — ABNORMAL LOW (ref 39.0–52.0)
Hemoglobin: 12 g/dL — ABNORMAL LOW (ref 13.0–17.0)
LYMPHS PCT: 4 %
Lymphs Abs: 0.3 10*3/uL — ABNORMAL LOW (ref 0.7–4.0)
MCH: 30.5 pg (ref 26.0–34.0)
MCHC: 31.9 g/dL (ref 30.0–36.0)
MCV: 95.7 fL (ref 78.0–100.0)
MONO ABS: 0.7 10*3/uL (ref 0.1–1.0)
Monocytes Relative: 9 %
Neutro Abs: 6.6 10*3/uL (ref 1.7–7.7)
Neutrophils Relative %: 86 %
PLATELETS: 190 10*3/uL (ref 150–400)
RBC: 3.93 MIL/uL — ABNORMAL LOW (ref 4.22–5.81)
RDW: 13.9 % (ref 11.5–15.5)
WBC: 7.7 10*3/uL (ref 4.0–10.5)

## 2015-12-26 LAB — COMPREHENSIVE METABOLIC PANEL
ALBUMIN: 3.1 g/dL — AB (ref 3.5–5.0)
ALT: 12 U/L — ABNORMAL LOW (ref 17–63)
ANION GAP: 5 (ref 5–15)
AST: 19 U/L (ref 15–41)
Alkaline Phosphatase: 60 U/L (ref 38–126)
BILIRUBIN TOTAL: 0.5 mg/dL (ref 0.3–1.2)
BUN: 12 mg/dL (ref 6–20)
CO2: 31 mmol/L (ref 22–32)
Calcium: 9.1 mg/dL (ref 8.9–10.3)
Chloride: 105 mmol/L (ref 101–111)
Creatinine, Ser: 0.8 mg/dL (ref 0.61–1.24)
GFR calc Af Amer: >60 mL/min (ref >60)
GLUCOSE: 149 mg/dL — AB (ref 65–99)
POTASSIUM: 3.7 mmol/L (ref 3.5–5.1)
Sodium: 141 mmol/L (ref 135–145)
TOTAL PROTEIN: 6.4 g/dL — AB (ref 6.5–8.1)

## 2015-12-26 LAB — I-STAT ARTERIAL BLOOD GAS, ED
Acid-Base Excess: 1 mmol/L (ref 0.0–2.0)
Bicarbonate: 25.2 mEq/L — ABNORMAL HIGH (ref 20.0–24.0)
O2 Saturation: 87 %
PCO2 ART: 37.3 mmHg (ref 35.0–45.0)
PH ART: 7.439 (ref 7.350–7.450)
Patient temperature: 98.6
TCO2: 26 mmol/L (ref 0–100)
pO2, Arterial: 50 mmHg — ABNORMAL LOW (ref 80.0–100.0)

## 2015-12-26 LAB — URINE MICROSCOPIC-ADD ON

## 2015-12-26 LAB — I-STAT CG4 LACTIC ACID, ED: Lactic Acid, Venous: 1.33 mmol/L (ref 0.5–2.0)

## 2015-12-26 LAB — AMMONIA: Ammonia: 21 umol/L (ref 9–35)

## 2015-12-26 LAB — VALPROIC ACID LEVEL: VALPROIC ACID LVL: 22 ug/mL — AB (ref 50.0–100.0)

## 2015-12-26 MED ORDER — DEXTROSE 5 % IV SOLN
1.0000 g | Freq: Once | INTRAVENOUS | Status: AC
  Administered 2015-12-26: 1 g via INTRAVENOUS
  Filled 2015-12-26: qty 10

## 2015-12-26 MED ORDER — SODIUM CHLORIDE 0.9 % IV BOLUS (SEPSIS)
1000.0000 mL | Freq: Once | INTRAVENOUS | Status: AC
  Administered 2015-12-26: 1000 mL via INTRAVENOUS

## 2015-12-26 MED ORDER — LORAZEPAM 2 MG/ML IJ SOLN
0.5000 mg | Freq: Once | INTRAMUSCULAR | Status: AC
  Administered 2015-12-26: 0.5 mg via INTRAVENOUS

## 2015-12-26 MED ORDER — SODIUM CHLORIDE 0.9 % IV SOLN
INTRAVENOUS | Status: DC
Start: 2015-12-26 — End: 2015-12-26
  Administered 2015-12-26: 12:00:00 via INTRAVENOUS

## 2015-12-26 MED ORDER — LORAZEPAM 2 MG/ML IJ SOLN
INTRAMUSCULAR | Status: AC
  Filled 2015-12-26: qty 1

## 2015-12-26 NOTE — ED Notes (Signed)
Dr. Rosalia Hammersay informed that family was back at the bedside and waiting on an update.

## 2015-12-26 NOTE — ED Notes (Signed)
Pt with witnessed seizure by staff, right arm and head jerking focal seizure noted. MD aware and is at bedside. Will administer 0.5mg  of ativan. Family also at bedside.

## 2015-12-26 NOTE — ED Notes (Signed)
Family arrived. This NT and Brooke, RN spoke with family and updated on pt status. Pt began having right sided twitching movements. Dr. Rosalia Hammersay notified.

## 2015-12-26 NOTE — ED Notes (Signed)
[facility]- pt here from [facility] with c/o witnessed seizure. Staff reports pt was more combative than normal last night and had a witnessed seizure during his bath this morning. Pt had second seizure with EMS, no meds given. Hypertensive at 177/98. Pt responsive to verbal stimuli on arrival. No hx of seizure.

## 2015-12-26 NOTE — ED Notes (Signed)
Pt transported to and from CT with this nurse without incident. Pt remains non-verbal however is is moving his extremities at this time.

## 2015-12-26 NOTE — Discharge Instructions (Signed)
Dr. Eloise HarmanPaterson will adjust depakote and will give rocephin and fluid orders.

## 2015-12-26 NOTE — ED Provider Notes (Signed)
CSN: [id number]     Arrival date & time 12/26/15  4098 History   First MD Initiated Contact with Patient 12/26/15 [phone]     Chief Complaint  Patient presents with  . Seizures  . Altered Mental Status   Level 5 caveat- patient uncommunicative, h.o.from ems from [facility] report  HPI  EMS reports called to [facility] for seizure.  Patient reported that baseline does not walk, unclear what level of consciousness or communication at baseline.  EMS reports that patient is dnr-[place] with them. [facility] reported [place] full body shaking lasted about five minutes.  EMS reports similar episode in truck en route, but resolved before meds given.  Patient with eyes to right and not speaking.  80 year old male from nursing home where he resides secondary to spinal cord injury 9 months ago which left him unable to walk with quadriparesis. He has had increased dementia with superimposed episodic encephalopathy. His wife and son, who are now at bedside state that he normally is awake and talking and intermittently somewhat agitated trying to get out of bed. However, they were called by nursing home today with reports he had a seizure and had decreased responsiveness. There've bedside and state that he is currently much less responsive than his normal baseline. They do not know of any precipitating events. Wife is power of attorney and she states that he would not wish to be intubated or have CPR done. They know of no prior history of seizures. Past Medical History  Diagnosis Date  . Carotid artery occlusion   . Hypertension   . Hyperlipidemia   . Arthritis   . Stroke [facility]) 1998 and  1999  . Ulcer     Hiatal Hernia / Gastric ulcers  . History of vagotomy   . Chronic diarrhea   . Fall against object Oct. 16, 2015    Pt got tangled up with dog chain out side in the woods.  . Diabetes mellitus 2006    TYPE 2  . Hemoptysis 06/04/2015   Past Surgical History  Procedure Laterality Date  . Carotid endarterectomy   03/02/2000    Right CEA  . Fracture surgery      Right Shoulder  . Fracture right leg    . Radiology with anesthesia N/A 04/15/2015    Procedure: MRI CERVICAL SPINE WITHOUT CONTRAST    (RADIOLOGY WITH ANESTHESIA);  Surgeon: Medication Radiologist, MD;  Location: [facility] OR;  Service: Radiology;  Laterality: N/A;  . Posterior cervical fusion/foraminotomy N/A 04/15/2015    Procedure: POSTERIOR CERVICAL FUSION/FORAMINOTOMY LEVEL 3;  Surgeon: Coletta Memos, MD;  Location: [facility] OR;  Service: Neurosurgery;  Laterality: N/A;  C3-6 posterior cervical arthrodesis with instrumentation  . Radiology with anesthesia Left 04/30/2015    Procedure: MRI BRAIN WITHOUT CONTRAST, MRI CERVICAL SPINE WITH AND WITHOUT CONTRAST;  Surgeon: Medication Radiologist, MD;  Location: [facility] OR;  Service: Radiology;  Laterality: Left;  DR. Derwood Kaplan   Family History  Problem Relation Age of Onset  . Cancer Mother     breast cancer  . Other Mother     Circulation problems and lower extremity amputation  . Other Father     cerebrovascular accident   Social History  Substance Use Topics  . Smoking status: Former Smoker    Types: Cigarettes    Quit date: 07/27/1999  . Smokeless tobacco: Never Used  . Alcohol Use: No     Comment: drinks 2 to 3 beers per night    " not since 9/18  2016 "    Review of Systems  Unable to perform ROS: Mental status change      Allergies  Review of patient's allergies indicates no known allergies.  Home Medications   Prior to Admission medications   Medication Sig Start Date End Date Taking? Authorizing Provider  acetaminophen (TYLENOL) 325 MG tablet Take 650 mg by mouth every 4 (four) hours as needed for mild pain.    Historical Provider, MD  albuterol (PROVENTIL) (2.5 MG/3ML) 0.083% nebulizer solution Take 3 mLs (2.5 mg total) by nebulization every 6 (six) hours as needed for wheezing or shortness of breath. 06/10/15   Leroy SeaPrashant K Singh, MD  Amino Acids-Protein Hydrolys (FEEDING SUPPLEMENT,  PRO-STAT SUGAR FREE 64,) LIQD Take 30 mLs by mouth 2 (two) times daily.    Historical Provider, MD  aspirin 81 MG chewable tablet Chew 1 tablet (81 mg total) by mouth daily. 04/22/15   Shanker Levora DredgeM Ghimire, MD  divalproex (DEPAKOTE SPRINKLE) 125 MG capsule Take 125-250 mg by mouth 2 (two) times daily. Take 1 capsule every morning and take 2 capsules every evening    Historical Provider, MD  feeding supplement, ENSURE ENLIVE, (ENSURE ENLIVE) LIQD Take 237 mLs by mouth 2 (two) times daily between meals. 06/10/15   Leroy SeaPrashant K Singh, MD  fentaNYL (DURAGESIC - DOSED MCG/HR) 25 MCG/HR patch Place 1 patch (25 mcg total) onto the skin every 3 (three) days. 06/10/15   Leroy SeaPrashant K Singh, MD  folic acid (FOLVITE) 1 MG tablet Take 1 tablet (1 mg total) by mouth daily. 04/22/15   Shanker Levora DredgeM Ghimire, MD  insulin aspart (NOVOLOG) 100 UNIT/ML injection Inject 3 Units into the skin See admin instructions. Every morning if CBG above 250    Historical Provider, MD  latanoprost (XALATAN) 0.005 % ophthalmic solution Place 1 drop into the right eye at bedtime. 04/03/15   Historical Provider, MD  Maltodextrin-Xanthan Gum (RESOURCE THICKENUP CLEAR) POWD Use to thicken all liquids to nectar thick 06/10/15   Leroy SeaPrashant K Singh, MD  metoprolol tartrate (LOPRESSOR) 25 MG tablet Take 1 tablet (25 mg total) by mouth 2 (two) times daily. 06/10/15   Leroy SeaPrashant K Singh, MD  Multiple Vitamin (MULTIVITAMIN WITH MINERALS) TABS tablet Take 1 tablet by mouth daily. 04/22/15   Shanker Levora DredgeM Ghimire, MD  pantoprazole (PROTONIX) 40 MG tablet Take 1 tablet (40 mg total) by mouth daily. 04/22/15   Shanker Levora DredgeM Ghimire, MD  polyethylene glycol (MIRALAX / GLYCOLAX) packet Take 17 g by mouth daily.    Historical Provider, MD  pravastatin (PRAVACHOL) 40 MG tablet Take 40 mg by mouth daily.    Historical Provider, MD  senna (SENOKOT) 8.6 MG TABS tablet Take 1 tablet (8.6 mg total) by mouth 2 (two) times daily. 04/22/15   Shanker Levora DredgeM Ghimire, MD  tamsulosin (FLOMAX) 0.4 MG  CAPS capsule Take 1 capsule (0.4 mg total) by mouth daily after supper. 06/10/15   Leroy SeaPrashant K Singh, MD  thiamine 100 MG tablet Take 1 tablet (100 mg total) by mouth daily. 04/22/15   Shanker Levora DredgeM Ghimire, MD   BP 188/72 mmHg  Pulse 100  Temp(Src) 99.4 F (37.4 C) (Rectal)  Resp 22  SpO2 100% Physical Exam  Constitutional: He appears well-developed. No distress.  HENT:  Head: Atraumatic.  Mucous membranes dry-   Eyes: Pupils are equal, round, and reactive to light.  Initially eyes deviated to right but began moving across midline  Neck: Normal range of motion. Neck supple.  Cardiovascular: Normal rate.   Pulmonary/Chest: Effort  normal and breath sounds normal.  Abdominal: Soft. Bowel sounds are normal.  Skin: Rash noted. Rash is macular.     Vitals reviewed.   ED Course  Procedures (including critical care time) Labs Review Labs Reviewed  URINALYSIS, ROUTINE W REFLEX MICROSCOPIC (NOT AT [facility]) - Abnormal; Notable for the following:    APPearance TURBID (*)    Hgb urine dipstick LARGE (*)    Protein, ur 100 (*)    Nitrite POSITIVE (*)    Leukocytes, UA LARGE (*)    All other components within normal limits  VALPROIC ACID LEVEL - Abnormal; Notable for the following:    Valproic Acid Lvl 22 (*)    All other components within normal limits  CBC WITH DIFFERENTIAL/PLATELET - Abnormal; Notable for the following:    RBC 3.93 (*)    Hemoglobin 12.0 (*)    HCT 37.6 (*)    Lymphs Abs 0.3 (*)    All other components within normal limits  COMPREHENSIVE METABOLIC PANEL - Abnormal; Notable for the following:    Glucose, Bld 149 (*)    Total Protein 6.4 (*)    Albumin 3.1 (*)    ALT 12 (*)    All other components within normal limits  URINE MICROSCOPIC-ADD ON - Abnormal; Notable for the following:    Squamous Epithelial / LPF 0-5 (*)    Bacteria, UA MANY (*)    All other components within normal limits  I-STAT ARTERIAL BLOOD GAS, ED - Abnormal; Notable for the following:    pO2,  Arterial 50.0 (*)    Bicarbonate 25.2 (*)    All other components within normal limits  CULTURE, BLOOD (ROUTINE X 2)  CULTURE, BLOOD (ROUTINE X 2)  URINE CULTURE  AMMONIA  I-STAT CG4 LACTIC ACID, ED    Imaging Review No results found. I have personally reviewed and evaluated these images and lab results as part of my medical decision-making.   EKG Interpretation None      MDM   Final diagnoses:  UTI (lower urinary tract infection)  New onset seizure (HCC)    1- altered mental status/new onset seizure- ativan given here.  Patient with valproic acid low, likely for behavior as no known history of seizures per family. Discussed with Dr. Eloise Harman and he will adjust depakote.  2- uti- iv rocephin given.  3- DNR- discussed extensively with wife, does not wish intubation, pressors.  She feels that returning to [facility] would be appropriate as he can have iv fluids and antibiotics there.  Plan discussion with Dr. Eloise Harman.      Margarita Grizzle, MD 12/26/15 1250

## 2015-12-27 LAB — URINE CULTURE: SPECIAL REQUESTS: NORMAL

## 2015-12-31 LAB — CULTURE, BLOOD (ROUTINE X 2)
CULTURE: NO GROWTH
CULTURE: NO GROWTH

## 2016-01-24 DEATH — deceased

## 2016-01-28 ENCOUNTER — Telehealth: Payer: Self-pay | Admitting: Pulmonary Disease

## 2016-01-28 DIAGNOSIS — R911 Solitary pulmonary nodule: Secondary | ICD-10-CM

## 2016-01-28 NOTE — Telephone Encounter (Signed)
Spoke with Kennyth ArnoldStacy and she states that last CT rec's were to repeat w/o contrast. Order is for w/contrast. She needs confirmation please. If w/contrast pt needs Bmet ordered also.   RA Please advise as to whether CT needs to be with or w/o contrast. Thanks!   Per LOV note:  Mediastinal lymphadenopathy - Oretha Milchakesh V Alva, MD at 06/25/2015 3:54 PM     Status: Linus OrnEdited Related Problem: Mediastinal lymphadenopathy   Expand All Collapse All   The right paratracheal lymphadenopathy is probably reactive. The pulmonary nodule is tiny and doubt of any clinical significance. Hemoptysis seems to have been related to anticoagulation and has now stopped, not recurred. Since he does have a significant history of smoking, would recommend CT with contrast in 3 months. I doubt that he is a candidate for invasive diagnostics anyways.       And per CT 2/17:  IMPRESSION: 1. Probable nodular scarring in the apical segment of the right upper lobe. Difficult to definitively exclude a nodule, however. Follow-up CT chest without contrast is recommended in 3 months. This recommendation follows the consensus statement: Guidelines for Management of Small Pulmonary Nodules Detected on CT Scans: A Statement from the Fleischner Society as published in Radiology 2005

## 2016-01-29 NOTE — Telephone Encounter (Signed)
Spoke with [employer]. Made her aware that RA wants CT with contrast. Order for BMET has been placed. Nothing further was needed.

## 2016-01-29 NOTE — Telephone Encounter (Signed)
WITH CONTRAST - To assess LNs

## 2016-03-10 ENCOUNTER — Other Ambulatory Visit: Payer: Medicare Other

## 2016-10-29 IMAGING — CT CT HEAD W/O CM
3 series · 15 of 47 positions shown, 18 images · non-contrast
Comparison: CT head 07/29/2015

CLINICAL DATA: Altered mental status

EXAM:
CT HEAD WITHOUT CONTRAST
TECHNIQUE: Contiguous axial images were obtained from the base of the skull
through the vertex without intravenous contrast.

[Series 2: head 5.0 h30s · axial · 0.50mm/px · z∈[-98,+42]mm · 9 of 34 slices shown, 12 images]
[im 3/34  brain]
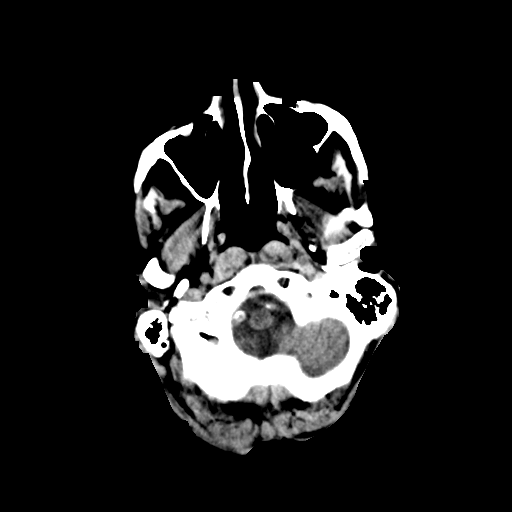
[im 3/34  bone]
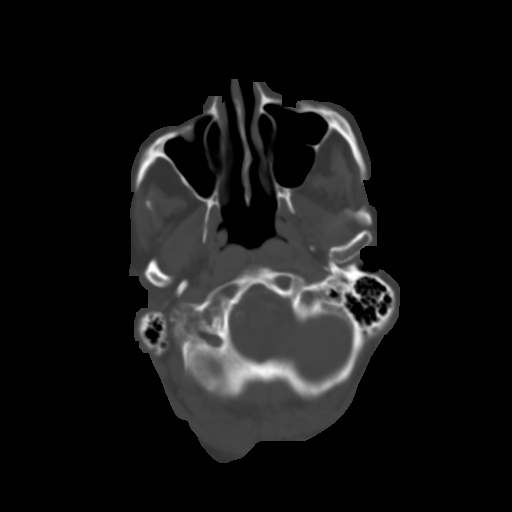
[im 6/34  brain]
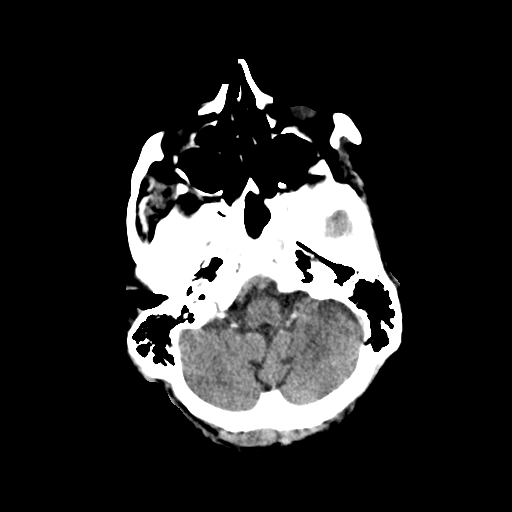
[im 10/34  brain]
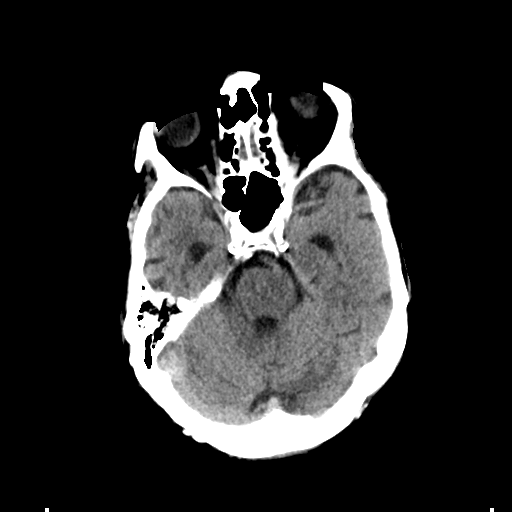
[im 13/34  brain]
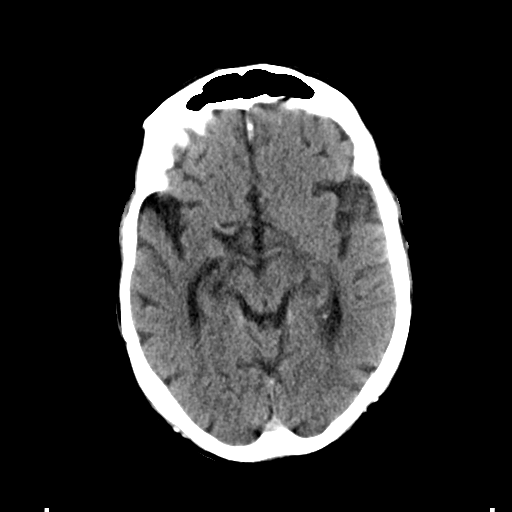
[im 18/34  brain]
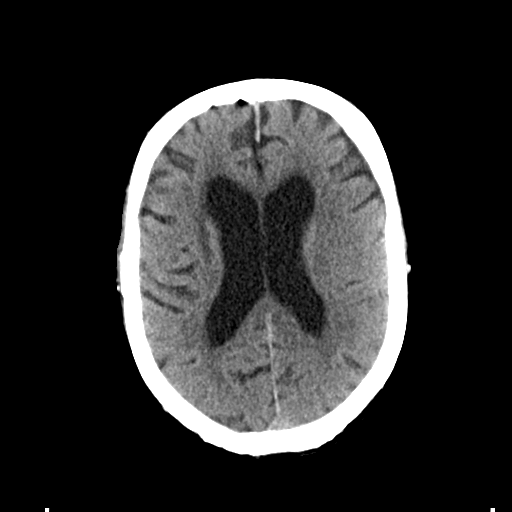
[im 18/34  bone]
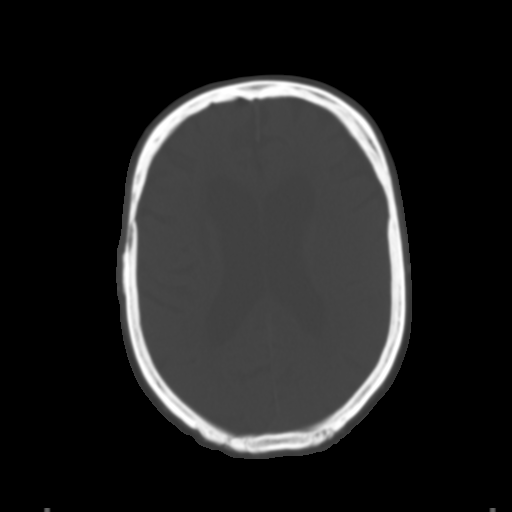
[im 21/34  brain]
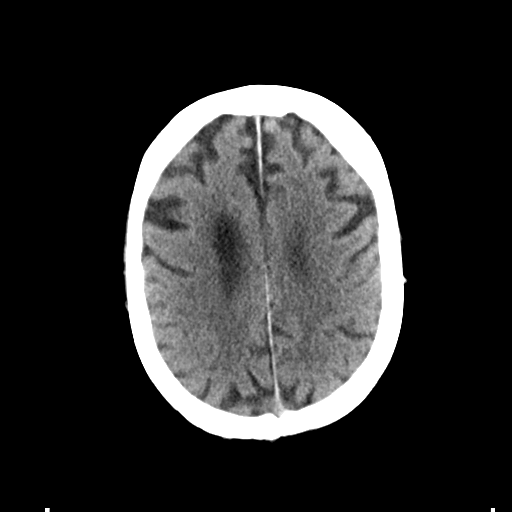
[im 24/34  brain]
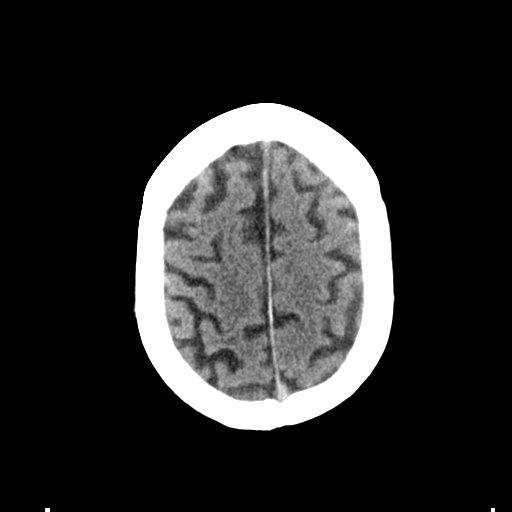
[im 28/34  brain]
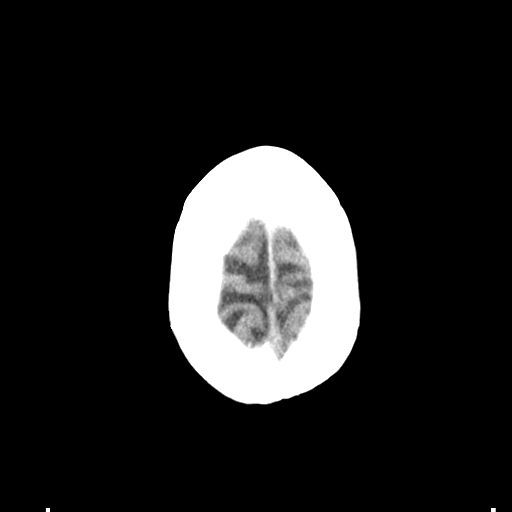
[im 31/34  brain]
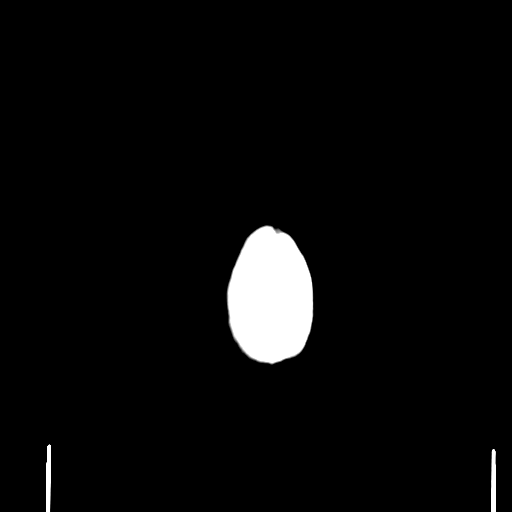
[im 31/34  bone]
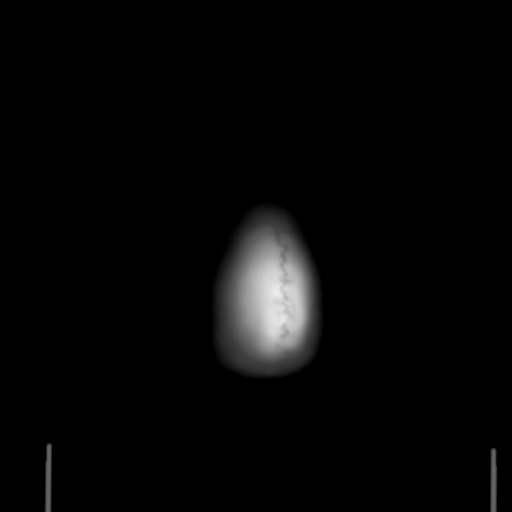

[Series 4: head 3.0 mpr cor · coronal · 0.36mm/px · 3 of 67 slices shown]
[im 23/67  brain]
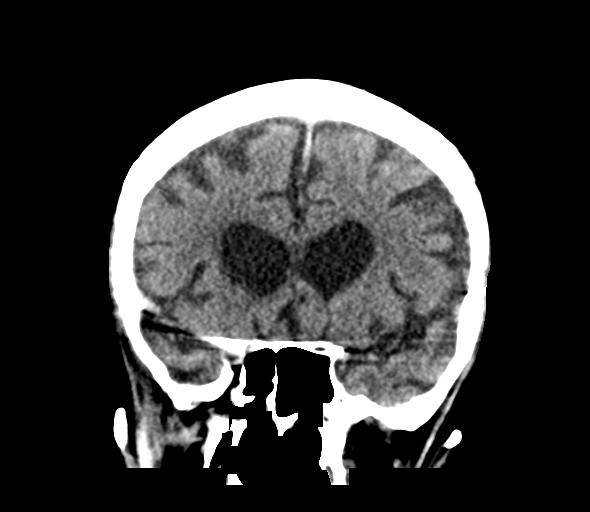
[im 30/67  brain]
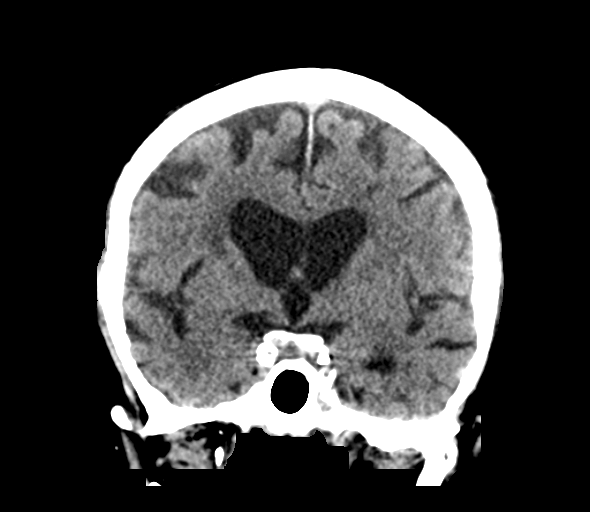
[im 37/67  brain]
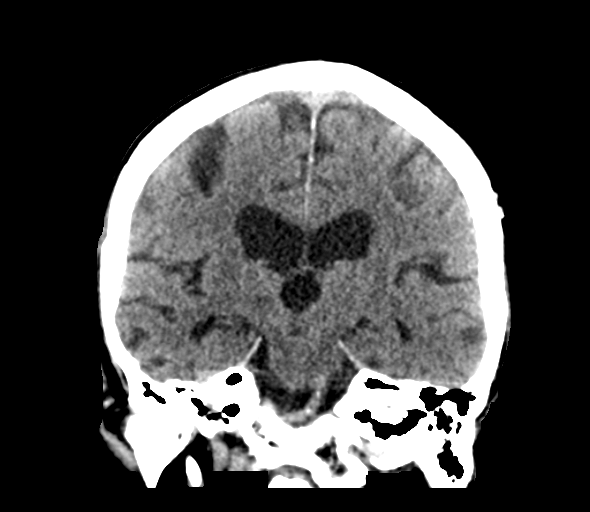

[Series 5: head 3.0 mpr sag · sagittal · 0.39mm/px · 3 of 64 slices shown]
[im 22/64  brain]
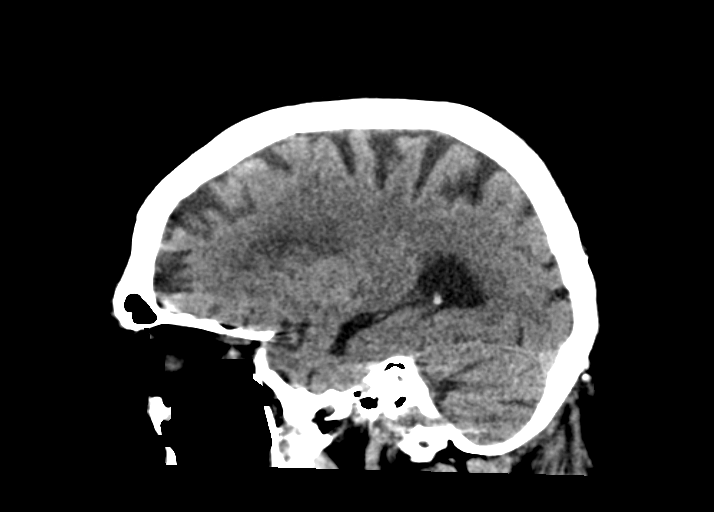
[im 32/64  brain]
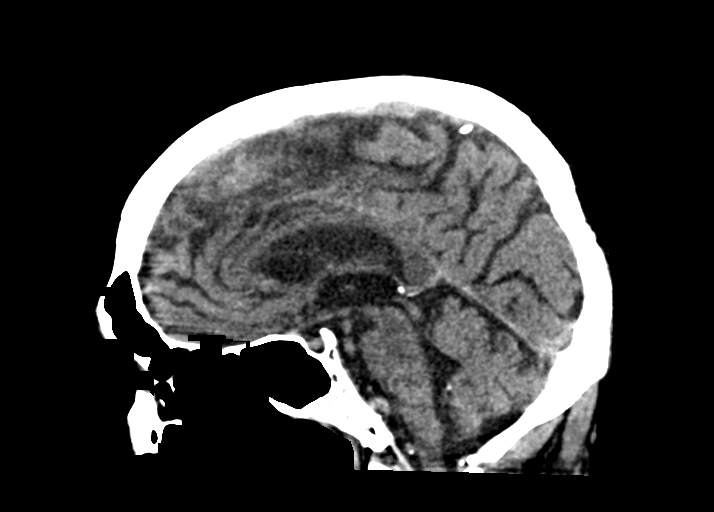
[im 43/64  brain]
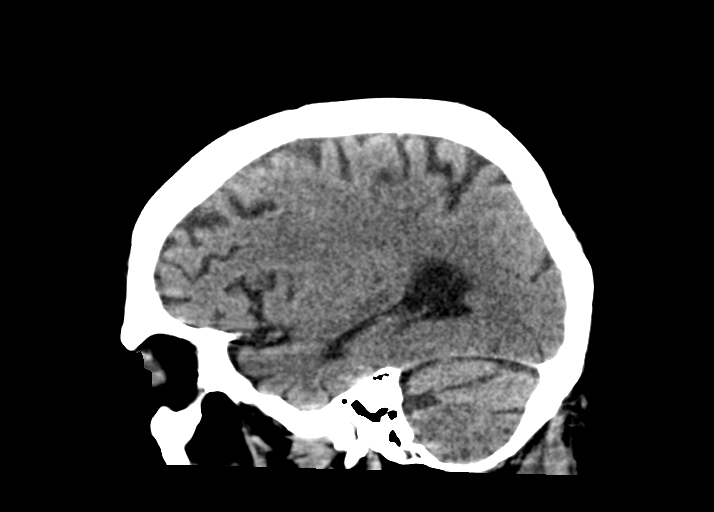

[15 of 47 positions shown; findings below may reference images not displayed]

FINDINGS: Moderate atrophy. Chronic infarct in the right internal capsule is
unchanged. Chronic ischemia in the thalamus bilaterally unchanged.
Chronic microvascular ischemic change in the white matter.

Negative for acute infarct. Negative for intracranial hemorrhage. No
mass or edema

Negative calvarium. Mild mucosal edema in the paranasal sinuses.
Extensive carotid artery calcification and vertebral artery
calcification.
IMPRESSION: Atrophy and chronic ischemic changes are stable. No superimposed
acute abnormality.
